# Patient Record
Sex: Male | Born: 1955
Health system: Southern US, Community
[De-identification: ages and names within clinical notes are randomized; demographics above are authoritative.]

## PROBLEM LIST (undated history)

## (undated) DIAGNOSIS — I7121 Aneurysm of the ascending aorta, without rupture: Secondary | ICD-10-CM

## (undated) DIAGNOSIS — I4891 Unspecified atrial fibrillation: Secondary | ICD-10-CM

## (undated) HISTORY — PX: CATARACT EXTRACTION, BILATERAL: SHX1313

## (undated) HISTORY — DX: Aneurysm of the ascending aorta, without rupture: I71.21

## (undated) HISTORY — DX: Unspecified atrial fibrillation: I48.91

## (undated) HISTORY — PX: PROSTATE BIOPSY: SHX241

---

## 1999-10-27 ENCOUNTER — Other Ambulatory Visit: Admission: RE | Admit: 1999-10-27 | Discharge: 1999-10-27 | Payer: Self-pay | Admitting: Gastroenterology

## 2003-03-08 ENCOUNTER — Emergency Department (HOSPITAL_COMMUNITY): Admission: EM | Admit: 2003-03-08 | Discharge: 2003-03-08 | Payer: Self-pay | Admitting: Emergency Medicine

## 2003-03-08 ENCOUNTER — Encounter: Payer: Self-pay | Admitting: Emergency Medicine

## 2005-05-12 ENCOUNTER — Ambulatory Visit: Payer: Self-pay | Admitting: Internal Medicine

## 2005-10-06 ENCOUNTER — Ambulatory Visit: Payer: Self-pay | Admitting: Internal Medicine

## 2005-10-14 ENCOUNTER — Ambulatory Visit: Payer: Self-pay | Admitting: Internal Medicine

## 2007-03-19 ENCOUNTER — Ambulatory Visit: Payer: Self-pay | Admitting: Internal Medicine

## 2007-03-19 LAB — CONVERTED CEMR LAB
ALT: 23 units/L (ref 0–40)
AST: 28 units/L (ref 0–37)
Alkaline Phosphatase: 53 units/L (ref 39–117)
BUN: 16 mg/dL (ref 6–23)
Basophils Relative: 0.9 % (ref 0.0–1.0)
CO2: 28 meq/L (ref 19–32)
Calcium: 9.2 mg/dL (ref 8.4–10.5)
Chloride: 109 meq/L (ref 96–112)
Creatinine, Ser: 1.1 mg/dL (ref 0.4–1.5)
GFR calc Af Amer: 91 mL/min
HDL: 47.8 mg/dL (ref >39.0)
LDL Cholesterol: 124 mg/dL — ABNORMAL HIGH (ref 0–99)
Monocytes Relative: 10.2 % (ref 3.0–11.0)
Neutro Abs: 1.7 10*3/uL (ref 1.4–7.7)
Platelets: 238 10*3/uL (ref 150–400)
RBC: 4.67 M/uL (ref 4.22–5.81)
RDW: 11.7 % (ref 11.5–14.6)
Total Protein: 6.4 g/dL (ref 6.0–8.3)
Triglycerides: 86 mg/dL (ref 0–149)
VLDL: 17 mg/dL (ref 0–40)
WBC: 4 10*3/uL — ABNORMAL LOW (ref 4.5–10.5)

## 2007-03-26 ENCOUNTER — Ambulatory Visit: Payer: Self-pay | Admitting: Internal Medicine

## 2007-04-04 ENCOUNTER — Ambulatory Visit: Payer: Self-pay | Admitting: Gastroenterology

## 2007-09-19 ENCOUNTER — Telehealth: Payer: Self-pay | Admitting: *Deleted

## 2007-09-25 DIAGNOSIS — J309 Allergic rhinitis, unspecified: Secondary | ICD-10-CM | POA: Insufficient documentation

## 2007-09-26 ENCOUNTER — Ambulatory Visit: Payer: Self-pay | Admitting: Internal Medicine

## 2007-10-02 ENCOUNTER — Telehealth (INDEPENDENT_AMBULATORY_CARE_PROVIDER_SITE_OTHER): Payer: Self-pay | Admitting: *Deleted

## 2007-10-05 ENCOUNTER — Encounter: Payer: Self-pay | Admitting: Internal Medicine

## 2007-10-31 ENCOUNTER — Telehealth: Payer: Self-pay | Admitting: *Deleted

## 2008-08-21 ENCOUNTER — Ambulatory Visit: Payer: Self-pay | Admitting: Internal Medicine

## 2008-08-21 LAB — CONVERTED CEMR LAB
ALT: 27 units/L (ref 0–53)
AST: 26 units/L (ref 0–37)
Basophils Absolute: 0 10*3/uL (ref 0.0–0.1)
Bilirubin, Direct: 0.1 mg/dL (ref 0.0–0.3)
Blood in Urine, dipstick: NEGATIVE
CO2: 29 meq/L (ref 19–32)
Chloride: 110 meq/L (ref 96–112)
Cholesterol: 203 mg/dL (ref 0–200)
Glucose, Urine, Semiquant: NEGATIVE
HDL: 55.5 mg/dL (ref >39.0)
Lymphocytes Relative: 32 % (ref 12.0–46.0)
MCHC: 34.9 g/dL (ref 30.0–36.0)
Neutrophils Relative %: 55.3 % (ref 43.0–77.0)
Protein, U semiquant: NEGATIVE
RBC: 4.67 M/uL (ref 4.22–5.81)
RDW: 11.8 % (ref 11.5–14.6)
Sodium: 144 meq/L (ref 135–145)
Total Bilirubin: 1 mg/dL (ref 0.3–1.2)
Urobilinogen, UA: 0.2
VLDL: 15 mg/dL (ref 0–40)
WBC Urine, dipstick: NEGATIVE
pH: 5.5

## 2008-09-01 ENCOUNTER — Ambulatory Visit: Payer: Self-pay | Admitting: Internal Medicine

## 2008-09-01 DIAGNOSIS — E785 Hyperlipidemia, unspecified: Secondary | ICD-10-CM | POA: Insufficient documentation

## 2008-09-01 DIAGNOSIS — Z86010 Personal history of colonic polyps: Secondary | ICD-10-CM | POA: Insufficient documentation

## 2008-09-01 HISTORY — DX: Hyperlipidemia, unspecified: E78.5

## 2008-09-03 ENCOUNTER — Telehealth: Payer: Self-pay | Admitting: *Deleted

## 2009-02-04 ENCOUNTER — Ambulatory Visit: Payer: Self-pay | Admitting: Internal Medicine

## 2009-02-04 DIAGNOSIS — T887XXA Unspecified adverse effect of drug or medicament, initial encounter: Secondary | ICD-10-CM | POA: Insufficient documentation

## 2009-02-04 LAB — CONVERTED CEMR LAB
AST: 27 units/L (ref 0–37)
Albumin: 4.1 g/dL (ref 3.5–5.2)
HDL: 59.6 mg/dL (ref >39.0)
LDL Cholesterol: 121 mg/dL — ABNORMAL HIGH (ref 0–99)
Total CHOL/HDL Ratio: 3.3
Triglycerides: 70 mg/dL (ref 0–149)

## 2009-02-26 ENCOUNTER — Ambulatory Visit: Payer: Self-pay | Admitting: Internal Medicine

## 2009-02-26 DIAGNOSIS — R030 Elevated blood-pressure reading, without diagnosis of hypertension: Secondary | ICD-10-CM | POA: Insufficient documentation

## 2009-02-26 DIAGNOSIS — G4700 Insomnia, unspecified: Secondary | ICD-10-CM | POA: Insufficient documentation

## 2009-03-30 ENCOUNTER — Encounter: Payer: Self-pay | Admitting: Internal Medicine

## 2009-03-31 ENCOUNTER — Encounter: Payer: Self-pay | Admitting: Internal Medicine

## 2010-07-27 ENCOUNTER — Telehealth: Payer: Self-pay | Admitting: Internal Medicine

## 2010-09-27 ENCOUNTER — Encounter: Payer: Self-pay | Admitting: Internal Medicine

## 2010-10-11 ENCOUNTER — Telehealth: Payer: Self-pay | Admitting: Internal Medicine

## 2010-10-15 ENCOUNTER — Ambulatory Visit: Payer: Self-pay | Admitting: Internal Medicine

## 2010-10-15 LAB — CONVERTED CEMR LAB
Albumin: 4 g/dL (ref 3.5–5.2)
BUN: 19 mg/dL (ref 6–23)
Basophils Absolute: 0 10*3/uL (ref 0.0–0.1)
Blood in Urine, dipstick: NEGATIVE
CO2: 24 meq/L (ref 19–32)
Chloride: 107 meq/L (ref 96–112)
Eosinophils Absolute: 0.1 10*3/uL (ref 0.0–0.7)
GFR calc non Af Amer: 80.11 mL/min (ref >60)
Glucose, Bld: 99 mg/dL (ref 70–99)
Glucose, Urine, Semiquant: NEGATIVE
HCT: 41 % (ref 39.0–52.0)
HDL: 53.4 mg/dL (ref >39.00)
Lymphs Abs: 1.7 10*3/uL (ref 0.7–4.0)
MCHC: 35.3 g/dL (ref 30.0–36.0)
MCV: 94.3 fL (ref 78.0–100.0)
Monocytes Absolute: 0.5 10*3/uL (ref 0.1–1.0)
Neutro Abs: 2 10*3/uL (ref 1.4–7.7)
PSA: 1.11 ng/mL (ref 0.10–4.00)
Platelets: 195 10*3/uL (ref 150.0–400.0)
Potassium: 4.1 meq/L (ref 3.5–5.1)
RDW: 13 % (ref 11.5–14.6)
Specific Gravity, Urine: 1.025
TSH: 1.4 microintl units/mL (ref 0.35–5.50)
Total Bilirubin: 0.9 mg/dL (ref 0.3–1.2)
WBC Urine, dipstick: NEGATIVE
pH: 5

## 2010-10-18 ENCOUNTER — Ambulatory Visit: Payer: Self-pay | Admitting: Internal Medicine

## 2010-11-03 ENCOUNTER — Ambulatory Visit: Payer: Self-pay | Admitting: Internal Medicine

## 2010-12-28 NOTE — Progress Notes (Signed)
Summary: Pt req to get an order to have chest xray done  Phone Note Call from Patient Call back at Work Phone [phone]   Caller: spouse-Janet Summary of Call: Pt is req to get a chest xray done. Pt just had a friend pass away with lung cancer and pt is just trying to be cautious. Pls advise.  Initial call taken by: Lucy Antigua,  October 11, 2010 3:36 PM  Follow-up for Phone Call        may order chest xray Follow-up by: Stacie Glaze MD,  October 11, 2010 7:34 PM

## 2010-12-28 NOTE — Progress Notes (Signed)
Summary: chest xray prior to cpx  Phone Note Call from Patient Call back at Home Phone [phone] Call back at [phone]   Caller: Patient Call For: Stacie Glaze MD Summary of Call: pt would like a chest xray prior to cpx on 10-12-2010.  Initial call taken by: Heron Sabins,  July 27, 2010 8:51 AM  Follow-up for Phone Call        ok- wife informed to have pt give [place] acall about 1-2 weeks prior and we will put in order and he can stop by at his convenience and have cxr Follow-up by: Willy Eddy, LPN,  July 27, 2010 9:00 AM

## 2010-12-28 NOTE — Consult Note (Signed)
Summary: [facility]  [facility]   Imported By: Maryln Gottron 10/01/2010 12:14:11  _____________________________________________________________________  External Attachment:    Type:   Image     Comment:   External Document

## 2010-12-30 NOTE — Assessment & Plan Note (Signed)
Summary: cpx/bmw   Vital Signs:  Patient profile:   55 year old male Height:      73 inches Weight:      209 pounds BMI:     27.67 Temp:     98.2 degrees F oral Pulse rate:   76 / minute Resp:     14 per minute BP sitting:   130 / 82  (left arm)  Vitals Entered By: Willy Eddy, LPN (November 03, 2010 3:29 PM) CC: cpx Is Patient Diabetic? No   Primary Care Mujahid Jalomo:  Stacie Glaze MD  CC:  cpx.  History of Present Illness: The pt was asked about all immunizations, health maint. services that are appropriate to their age and was given guidance on diet exercize  and weight management   Preventive Screening-Counseling & Management  Alcohol-Tobacco     Smoking Status: quit     Tobacco Counseling: to remain off tobacco products  Problems Prior to Update: 1)  Elevated Bp Reading Without Dx Hypertension  (ICD-796.2) 2)  Insomnia, Chronic, Mild  (ICD-307.42) 3)  Uns Advrs Eff Uns Rx Medicinal&biological Sbstnc  (ICD-995.20) 4)  Colonic Polyps, Hx of  (ICD-V12.72) 5)  Hyperlipidemia  (ICD-272.4) 6)  Physical Examination  (ICD-V70.0) 7)  Allergic Rhinitis  (ICD-477.9) 8)  Family History of Colon Ca 1st Degree Relative <60  (ICD-V16.0)  Current Problems (verified): 1)  Elevated Bp Reading Without Dx Hypertension  (ICD-796.2) 2)  Insomnia, Chronic, Mild  (ICD-307.42) 3)  Uns Advrs Eff Uns Rx Medicinal&biological Sbstnc  (ICD-995.20) 4)  Colonic Polyps, Hx of  (ICD-V12.72) 5)  Hyperlipidemia  (ICD-272.4) 6)  Physical Examination  (ICD-V70.0) 7)  Allergic Rhinitis  (ICD-477.9) 8)  Family History of Colon Ca 1st Degree Relative <60  (ICD-V16.0)  Medications Prior to Update: 1)  Bayer Aspirin 325 Mg  Tabs (Aspirin) 2)  Ambien Cr 12.5 Mg  Tbcr (Zolpidem Tartrate) .... One By Mouth Q Hs 3)  Fish Oil 1000 Mg Caps (Omega-3 Fatty Acids) .Marland Kitchen.. 1 Once Daily  Current Medications (verified): 1)  Bayer Aspirin 325 Mg  Tabs (Aspirin) 2)  Ambien Cr 12.5 Mg  Tbcr (Zolpidem  Tartrate) .... One By Mouth Q Hs 3)  Fish Oil 1000 Mg Caps (Omega-3 Fatty Acids) .Marland Kitchen.. 1 Once Daily  Allergies (verified): No Known Drug Allergies  Past History:  Family History: Last updated: 09/01/2008 Family History of Colon CA 1st degree relative <60 Family History of testicular cancer  brother  Social History: Last updated: 09/25/2007 Occupation: Married Former Smoker Alcohol use-yes  Risk Factors: Smoking Status: quit (11/03/2010)  Past medical, surgical, family and social histories (including risk factors) reviewed, and no changes noted (except as noted below).  Past Medical History: Reviewed history from 09/01/2008 and no changes required. Allergies Allergic rhinitis Hyperlipidemia borderline Colonic polyps, hx of  Past Surgical History: Reviewed history from 09/01/2008 and no changes required. Denies surgical history Colon polypectomy  Family History: Reviewed history from 09/01/2008 and no changes required. Family History of Colon CA 1st degree relative <60 Family History of testicular cancer  brother  Social History: Reviewed history from 09/25/2007 and no changes required. Occupation: Married Former Smoker Alcohol use-yes  Review of Systems  The patient denies anorexia, fever, weight loss, weight gain, vision loss, decreased hearing, hoarseness, chest pain, syncope, dyspnea on exertion, peripheral edema, prolonged cough, headaches, hemoptysis, abdominal pain, melena, hematochezia, severe indigestion/heartburn, hematuria, incontinence, genital sores, muscle weakness, suspicious skin lesions, transient blindness, difficulty walking, depression, unusual weight change, abnormal  bleeding, enlarged lymph nodes, angioedema, breast masses, and testicular masses.         Flu Vaccine Consent Questions     Do you have a history of severe allergic reactions to this vaccine? no    Any prior history of allergic reactions to egg and/or gelatin? no    Do you have a  sensitivity to the preservative Thimersol? no    Do you have a past history of Guillan-Barre Syndrome? no    Do you currently have an acute febrile illness? no    Have you ever had a severe reaction to latex? no    Vaccine information given and explained to patient? yes    Are you currently pregnant? no    Lot Number:[id number]   Exp Date:05/28/2011   Site Given  Left Deltoid IM   Physical Exam  General:  Well-developed,well-nourished,in no acute distress; alert,appropriate and cooperative throughout examination Head:  normocephalic and atraumatic.   Eyes:  pupils equal and pupils round.   Ears:  R ear normal and L ear normal.   Nose:  no external deformity and no nasal discharge.   Mouth:  good dentition and pharynx pink and moist.   Neck:  No deformities, masses, or tenderness noted. Lungs:  normal respiratory effort and no wheezes.   Heart:  normal rate and regular rhythm.   Abdomen:  Bowel sounds positive,abdomen soft and non-tender without masses, organomegaly or hernias noted. Rectal:  No external abnormalities noted. Normal sphincter tone. No rectal masses or tenderness. Genitalia:  Testes bilaterally descended without nodularity, tenderness or masses. No scrotal masses or lesions. No penis lesions or urethral discharge. Prostate:  no nodules and no asymmetry.   Msk:  no joint tenderness and no joint swelling.   Pulses:  R and L carotid,radial,femoral,dorsalis pedis and posterior tibial pulses are full and equal bilaterally Extremities:  No clubbing, cyanosis, edema, or deformity noted with normal full range of motion of all joints.   Neurologic:  No cranial nerve deficits noted. Station and gait are normal. Plantar reflexes are down-going bilaterally. DTRs are symmetrical throughout. Sensory, motor and coordinative functions appear intact.   Impression & Recommendations:  Problem # 1:  ELEVATED BP READING WITHOUT DX HYPERTENSION (ICD-796.2) Assessment Improved  BP today:  130/82 Prior BP: 142/90 (02/26/2009)  Labs Reviewed: Creat: 1.0 (10/15/2010) Chol: 202 (10/15/2010)   HDL: 53.40 (10/15/2010)   LDL: 121 (02/04/2009)   TG: 92.0 (10/15/2010)  Instructed in low sodium diet (DASH Handout) and behavior modification.    Problem # 2:  HYPERLIPIDEMIA (ICD-272.4) Assessment: Improved  Labs Reviewed: SGOT: 25 (10/15/2010)   SGPT: 28 (10/15/2010)   HDL:53.40 (10/15/2010), 59.6 (02/04/2009)  LDL:121 (02/04/2009), DEL (08/21/2008)  Chol:202 (10/15/2010), 195 (02/04/2009)  Trig:92.0 (10/15/2010), 70 (02/04/2009)  Problem # 3:  PHYSICAL EXAMINATION (ICD-V70.0) The pt was asked about all immunizations, health maint. services that are appropriate to their age and was given guidance on diet exercize  and weight management  Colonoscopy: Results: Polyp.  Pathology:  Adenomatous polyp.         (08/20/2007) Td Booster: Historical (11/28/2006)   Flu Vax: Fluvax 3+ (11/03/2010)   Chol: 202 (10/15/2010)   HDL: 53.40 (10/15/2010)   LDL: 121 (02/04/2009)   TG: 92.0 (10/15/2010) TSH: 1.40 (10/15/2010)   PSA: 1.11 (10/15/2010)  Discussed using sunscreen, use of alcohol, drug use, self testicular exam, routine dental care, routine eye care, routine physical exam, seat belts, multiple vitamins, osteoporosis prevention, adequate calcium intake in diet, and recommendations for immunizations.  Discussed exercise and checking cholesterol.  Discussed gun safety, safe sex, and contraception. Also recommend checking PSA.  Complete Medication List: 1)  Bayer Aspirin 325 Mg Tabs (Aspirin) 2)  Ambien Cr 12.5 Mg Tbcr (Zolpidem tartrate) .... One by mouth q hs 3)  Fish Oil 1000 Mg Caps (Omega-3 fatty acids) .Marland Kitchen.. 1 once daily  Other Orders: Admin 1st Vaccine (16109) Flu Vaccine 9yrs + (60454)  Patient Instructions: 1)  Please schedule a follow-up appointment in 4 months. 2)  slight elevations of bad cholesterol that increase risk 3)  omega three's will help to correct this 4)   exercize and weight reduction goals 5)  Add fish oil or kril oil 6)  1000 mg two times a day 7)  Hepatic Panel prior to visit, ICD-9:995.20 8)  Lipid Panel prior to visit, ICD-9:272.4 Prescriptions: AMBIEN CR 12.5 MG  TBCR (ZOLPIDEM TARTRATE) one by mouth q HS  #30 x 5   Entered by:   Willy Eddy, LPN   Authorized by:   Stacie Glaze MD   Signed by:   Willy Eddy, LPN on 09/81/1914   Method used:   Print then Give to Patient   RxID:   [id number]    Orders Added: 1)  Admin 1st Vaccine [90471] 2)  Flu Vaccine 35yrs + [69629] 3)  Est. Patient 40-64 years [52841]

## 2011-02-23 ENCOUNTER — Other Ambulatory Visit: Payer: Self-pay

## 2011-03-02 ENCOUNTER — Ambulatory Visit: Payer: Self-pay | Admitting: Internal Medicine

## 2011-03-02 DIAGNOSIS — Z0289 Encounter for other administrative examinations: Secondary | ICD-10-CM

## 2011-06-20 ENCOUNTER — Other Ambulatory Visit: Payer: Self-pay | Admitting: *Deleted

## 2011-06-20 MED ORDER — ZOLPIDEM TARTRATE ER 12.5 MG PO TBCR: 12.5000 mg | EXTENDED_RELEASE_TABLET | Freq: Every evening | ORAL | Status: DC | PRN

## 2012-07-02 ENCOUNTER — Other Ambulatory Visit: Payer: Self-pay

## 2012-07-09 ENCOUNTER — Encounter: Payer: Self-pay | Admitting: Internal Medicine

## 2012-12-14 ENCOUNTER — Ambulatory Visit (INDEPENDENT_AMBULATORY_CARE_PROVIDER_SITE_OTHER): Payer: BC Managed Care – PPO | Admitting: Internal Medicine

## 2012-12-14 ENCOUNTER — Encounter: Payer: Self-pay | Admitting: Internal Medicine

## 2012-12-14 VITALS — BP 150/90 | HR 84 | Temp 97.7°F | Resp 18 | Wt 206.0 lb

## 2012-12-14 DIAGNOSIS — F411 Generalized anxiety disorder: Secondary | ICD-10-CM

## 2012-12-14 DIAGNOSIS — F418 Other specified anxiety disorders: Secondary | ICD-10-CM

## 2012-12-14 MED ORDER — LORAZEPAM 0.5 MG PO TABS: 0.5000 mg | ORAL_TABLET | Freq: Two times a day (BID) | ORAL | Status: DC | PRN

## 2012-12-14 MED ORDER — ESCITALOPRAM OXALATE 10 MG PO TABS: 10.0000 mg | ORAL_TABLET | Freq: Every day | ORAL | Status: DC

## 2012-12-14 MED ORDER — ZOLPIDEM TARTRATE 10 MG PO TABS: 10.0000 mg | ORAL_TABLET | Freq: Every evening | ORAL | Status: DC | PRN

## 2012-12-14 NOTE — Progress Notes (Signed)
  Subjective:    Patient ID: Sean [name], male    DOB: Sep 24, 1956, 57 y.o.   MRN: [id number]  HPI  57 year old patient who presents with a chief complaint of anxiety. He has been under considerable situational stress related  To 2  teenage children which apparently has peaked over the past few days. This has resulted in worsening insomnia and excessive daytime anxiety and worry.  He is a Environmental education officer and deals with the work-related stress on a regular basis but presently feels overwhelmed due  to the worsening stressors  No past medical history on file.  History   Social History  . Marital Status: Married    Spouse Name: N/A    Number of Children: N/A  . Years of Education: N/A   Occupational History  . Not on file.   Social History Main Topics  . Smoking status: Current Some Day Smoker    Types: Cigars  . Smokeless tobacco: Never Used  . Alcohol Use: 2.4 - 3.0 oz/week    4-5 Cans of beer per week  . Drug Use: No  . Sexually Active: Not on file   Other Topics Concern  . Not on file   Social History Narrative  . No narrative on file    No past surgical history on file.  No family history on file.  No Known Allergies  Current Outpatient Prescriptions on File Prior to Visit  Medication Sig Dispense Refill  . escitalopram (LEXAPRO) 10 MG tablet Take 1 tablet (10 mg total) by mouth daily.  60 tablet  2  . zolpidem (AMBIEN) 10 MG tablet Take 1 tablet (10 mg total) by mouth at bedtime as needed for sleep.  30 tablet  3    BP 150/90  Pulse 84  Temp 97.7 F (36.5 C) (Oral)  Resp 18  Wt 206 lb (93.441 kg)      Review of Systems  Psychiatric/Behavioral: Positive for sleep disturbance. The patient is nervous/anxious.        Objective:   Physical Exam  Constitutional: He appears well-developed and well-nourished. No distress.       140/86  Psychiatric: He has a normal mood and affect. His behavior is normal. Judgment and thought content normal.           Assessment & Plan:   Situational stress with insomnia. We'll refill Ambien which has been used and helpful in the past. Options were discussed;  we'll treat with the Lexapro as well as when necessary alprazolam.  He will consider behavioral health referral. Stress management discussed The patient will return in 4-6 weeks for followup but will call if he is not  pleased with his progress or desires referral for counseling

## 2012-12-14 NOTE — Patient Instructions (Addendum)
Call or return to clinic prn if these symptoms worsen or fail to improve as anticipated.

## 2013-03-12 ENCOUNTER — Other Ambulatory Visit: Payer: BC Managed Care – PPO

## 2013-03-14 ENCOUNTER — Other Ambulatory Visit (INDEPENDENT_AMBULATORY_CARE_PROVIDER_SITE_OTHER): Payer: BC Managed Care – PPO

## 2013-03-14 DIAGNOSIS — Z0000 Encounter for general adult medical examination without abnormal findings: Secondary | ICD-10-CM

## 2013-03-14 LAB — CBC WITH DIFFERENTIAL/PLATELET
Basophils Absolute: 0 10*3/uL (ref 0.0–0.1)
Hemoglobin: 15.1 g/dL (ref 13.0–17.0)
Lymphocytes Relative: 35.2 % (ref 12.0–46.0)
Monocytes Relative: 11.6 % (ref 3.0–12.0)
Neutro Abs: 2.5 10*3/uL (ref 1.4–7.7)
RDW: 12.4 % (ref 11.5–14.6)

## 2013-03-14 LAB — HEPATIC FUNCTION PANEL
AST: 27 U/L (ref 0–37)
Albumin: 4.1 g/dL (ref 3.5–5.2)
Alkaline Phosphatase: 44 U/L (ref 39–117)
Bilirubin, Direct: 0.1 mg/dL (ref 0.0–0.3)
Total Bilirubin: 0.7 mg/dL (ref 0.3–1.2)

## 2013-03-14 LAB — BASIC METABOLIC PANEL
Calcium: 9 mg/dL (ref 8.4–10.5)
GFR: 79.39 mL/min (ref >60.00)
Glucose, Bld: 93 mg/dL (ref 70–99)
Sodium: 138 mEq/L (ref 135–145)

## 2013-03-14 LAB — LIPID PANEL
Cholesterol: 173 mg/dL (ref 0–200)
LDL Cholesterol: 99 mg/dL (ref 0–99)
Triglycerides: 139 mg/dL (ref 0.0–149.0)
VLDL: 27.8 mg/dL (ref 0.0–40.0)

## 2013-03-14 LAB — POCT URINALYSIS DIPSTICK
Bilirubin, UA: NEGATIVE
Glucose, UA: NEGATIVE
Ketones, UA: NEGATIVE
Spec Grav, UA: 1.02

## 2013-03-18 ENCOUNTER — Encounter: Payer: Self-pay | Admitting: Internal Medicine

## 2013-03-18 ENCOUNTER — Ambulatory Visit (INDEPENDENT_AMBULATORY_CARE_PROVIDER_SITE_OTHER): Payer: BC Managed Care – PPO | Admitting: Internal Medicine

## 2013-03-18 VITALS — BP 136/80 | HR 72 | Temp 98.2°F | Resp 16 | Ht 73.0 in | Wt 208.0 lb

## 2013-03-18 DIAGNOSIS — Z0000 Encounter for general adult medical examination without abnormal findings: Secondary | ICD-10-CM

## 2013-03-18 NOTE — Patient Instructions (Addendum)
The patient is instructed to continue all medications as prescribed. Schedule followup with check out clerk upon leaving the clinic  

## 2013-03-18 NOTE — Progress Notes (Signed)
Subjective:    Patient ID: Sean [name], male    DOB: 1956/06/23, 57 y.o.   MRN: [id number]  HPI CPX discussion of weight   Review of Systems  Constitutional: Negative for fever and fatigue.  HENT: Negative for hearing loss, congestion, neck pain and postnasal drip.   Eyes: Negative for discharge, redness and visual disturbance.  Respiratory: Negative for cough, shortness of breath and wheezing.   Cardiovascular: Negative for leg swelling.  Gastrointestinal: Negative for abdominal pain, constipation and abdominal distention.  Genitourinary: Negative for urgency and frequency.  Musculoskeletal: Negative for joint swelling and arthralgias.  Skin: Negative for color change and rash.  Neurological: Negative for weakness and light-headedness.  Hematological: Negative for adenopathy.  Psychiatric/Behavioral: Negative for behavioral problems.   History reviewed. No pertinent past medical history.  History   Social History  . Marital Status: Married    Spouse Name: N/A    Number of Children: N/A  . Years of Education: N/A   Occupational History  . Not on file.   Social History Main Topics  . Smoking status: Current Some Day Smoker    Types: Cigars  . Smokeless tobacco: Never Used  . Alcohol Use: 2.4 - 3 oz/week    4-5 Cans of beer per week  . Drug Use: No  . Sexually Active: Not on file   Other Topics Concern  . Not on file   Social History Narrative  . No narrative on file    History reviewed. No pertinent past surgical history.  No family history on file.  No Known Allergies  Current Outpatient Prescriptions on File Prior to Visit  Medication Sig Dispense Refill  . aspirin 81 MG tablet Take 81 mg by mouth daily.      . MULTIPLE VITAMIN PO Take 1 tablet by mouth daily.      . OMEGA-3 KRILL OIL PO Take 1 capsule by mouth daily.      . Pseudoephedrine HCl (SUDAFED 12 HOUR PO) Take 1 tablet by mouth 2 (two) times daily as needed.      . zolpidem (AMBIEN) 10  MG tablet Take 1 tablet (10 mg total) by mouth at bedtime as needed for sleep.  30 tablet  3   No current facility-administered medications on file prior to visit.    BP 136/80  Pulse 72  Temp(Src) 98.2 F (36.8 C)  Resp 16  Ht 6\' 1"  (1.854 m)  Wt 208 lb (94.348 kg)  BMI 27.45 kg/m2        Objective:   Physical Exam  Nursing note and vitals reviewed. Constitutional: He is oriented to person, place, and time. He appears well-developed and well-nourished.  HENT:  Head: Normocephalic and atraumatic.  Eyes: Conjunctivae are normal. Pupils are equal, round, and reactive to light.  Neck: Normal range of motion. Neck supple.  Cardiovascular: Normal rate and regular rhythm.   Pulmonary/Chest: Effort normal and breath sounds normal.  Abdominal: Soft. Bowel sounds are normal.  Genitourinary: Rectum normal and prostate normal.  Musculoskeletal: Normal range of motion.  Neurological: He is alert and oriented to person, place, and time.  Skin: Skin is warm.  Psychiatric: He has a normal mood and affect. His behavior is normal.          Assessment & Plan:  Situational depression resolved Sleep stable  Patient presents for yearly preventative medicine examination.   all immunizations and health maintenance protocols were reviewed with the patient and they are up to date with these  protocols.   screening laboratory values were reviewed with the patient including screening of hyperlipidemia PSA renal function and hepatic function.   There medications past medical history social history problem list and allergies were reviewed in detail.   Goals were established with regard to weight loss exercise diet in compliance with medications

## 2013-04-12 ENCOUNTER — Other Ambulatory Visit: Payer: Self-pay | Admitting: Surgery

## 2013-06-06 ENCOUNTER — Telehealth: Payer: Self-pay | Admitting: Internal Medicine

## 2013-06-06 NOTE — Telephone Encounter (Signed)
Pt requesting referral to ENT for chronic raw throat. Please advise.

## 2013-06-07 ENCOUNTER — Ambulatory Visit (INDEPENDENT_AMBULATORY_CARE_PROVIDER_SITE_OTHER): Payer: BC Managed Care – PPO | Admitting: Internal Medicine

## 2013-06-07 ENCOUNTER — Encounter: Payer: Self-pay | Admitting: Internal Medicine

## 2013-06-07 ENCOUNTER — Ambulatory Visit: Payer: BC Managed Care – PPO | Admitting: Family Medicine

## 2013-06-07 VITALS — BP 148/90 | HR 68 | Temp 99.0°F | Wt 208.0 lb

## 2013-06-07 DIAGNOSIS — J019 Acute sinusitis, unspecified: Secondary | ICD-10-CM

## 2013-06-07 MED ORDER — LEVOFLOXACIN 500 MG PO TABS: 500.0000 mg | ORAL_TABLET | Freq: Every day | ORAL | Status: DC

## 2013-06-07 NOTE — Progress Notes (Signed)
  Subjective:    Patient ID: Sean [name], male    DOB: 1956/04/10, 57 y.o.   MRN: [id number]  HPI  Several weeks of sore throat, Past nasal drip and irritation Right sided cervical tenderness Recent skin cancer surgery Moderately elevated blood pressure ( anxiety)    Review of Systems  Constitutional: Negative for fever and fatigue.  HENT: Negative for hearing loss, congestion, neck pain and postnasal drip.   Eyes: Negative for discharge, redness and visual disturbance.  Respiratory: Negative for cough, shortness of breath and wheezing.   Cardiovascular: Negative for leg swelling.  Gastrointestinal: Negative for abdominal pain, constipation and abdominal distention.  Genitourinary: Negative for urgency and frequency.  Musculoskeletal: Negative for joint swelling and arthralgias.  Skin: Negative for color change and rash.  Neurological: Negative for weakness and light-headedness.  Hematological: Negative for adenopathy.  Psychiatric/Behavioral: Negative for behavioral problems.   No past medical history on file.  History   Social History  . Marital Status: Married    Spouse Name: N/A    Number of Children: N/A  . Years of Education: N/A   Occupational History  . Not on file.   Social History Main Topics  . Smoking status: Current Some Day Smoker    Types: Cigars  . Smokeless tobacco: Never Used  . Alcohol Use: 2.4 - 3 oz/week    4-5 Cans of beer per week  . Drug Use: No  . Sexually Active: Not on file   Other Topics Concern  . Not on file   Social History Narrative  . No narrative on file    No past surgical history on file.  No family history on file.  No Known Allergies  Current Outpatient Prescriptions on File Prior to Visit  Medication Sig Dispense Refill  . aspirin 81 MG tablet Take 81 mg by mouth daily.      . MULTIPLE VITAMIN PO Take 1 tablet by mouth daily.      . OMEGA-3 KRILL OIL PO Take 1 capsule by mouth daily.      . Pseudoephedrine HCl  (SUDAFED 12 HOUR PO) Take 1 tablet by mouth 2 (two) times daily as needed.      . zolpidem (AMBIEN) 10 MG tablet Take 1 tablet (10 mg total) by mouth at bedtime as needed for sleep.  30 tablet  3   No current facility-administered medications on file prior to visit.    BP 148/90  Pulse 68  Temp(Src) 99 F (37.2 C) (Oral)  Wt 208 lb (94.348 kg)  BMI 27.45 kg/m2       Objective:   Physical Exam  Nursing note and vitals reviewed. Constitutional: He appears well-developed and well-nourished.  HENT:  Head: Normocephalic and atraumatic.  Eyes: Conjunctivae are normal. Pupils are equal, round, and reactive to light.  Neck: Normal range of motion. Neck supple.  Cardiovascular: Normal rate and regular rhythm.   Pulmonary/Chest: Effort normal and breath sounds normal.  Abdominal: Soft. Bowel sounds are normal.          Assessment & Plan:  Acute sinus  Infection treat with Levaquin 500 mg by mouth daily for 10 days contact through my chart for resolution of symptoms

## 2013-06-07 NOTE — Patient Instructions (Addendum)
The patient is instructed to continue all medications as prescribed. Schedule followup with check out clerk upon leaving the clinic  

## 2013-06-07 NOTE — Telephone Encounter (Signed)
Ov with dr Lovell Sheehan this pm

## 2013-06-12 ENCOUNTER — Encounter: Payer: Self-pay | Admitting: Internal Medicine

## 2013-06-19 ENCOUNTER — Telehealth: Payer: Self-pay | Admitting: Internal Medicine

## 2013-06-19 ENCOUNTER — Ambulatory Visit: Payer: Self-pay | Admitting: Family Medicine

## 2013-06-19 NOTE — Telephone Encounter (Signed)
Talked with wife and told her that dr j is out of office,but will return tomorrow and will ask, but he usually orders ct of sinus an d if chronic sinusitis will take antibioitc for 21 days. Will send for dr Lovell Sheehan to advise

## 2013-06-19 NOTE — Telephone Encounter (Signed)
Talked with Sean [name] and he will wait until dr Lovell Sheehan responds in am

## 2013-06-19 NOTE — Telephone Encounter (Signed)
Patient Information:  Caller Name: [place]  Phone: [phone]  Patient: Sean [name], Sean [name]  Gender: Male  DOB: 04-11-1956  Age: 57 Years  PCP: Darryll Capers (Adults only)  Office Follow Up:  Does the office need to follow up with this patient?: No  Instructions For The Office: N/A  RN Note:  Mild sinus discomfort behind right eye.  Had basal cell cancer removed from nostril near eye 05/30/13. Felt much improved while taking Levoquin but still  minimally symptomatic after completed antibiotic. Advised to hydrate to keep mucus loose.   Symptoms  Reason For Call & Symptoms: Recurrent post nasal drip with white discharge on posterior pharynx.  Symtpoms returned after completed 10 day dose of Levoquin.  Reviewed Health History In EMR: Yes  Reviewed Medications In EMR: Yes  Reviewed Allergies In EMR: Yes  Reviewed Surgeries / Procedures: Yes  Date of Onset of Symptoms: 06/17/2013  Treatments Tried: Sudafed  Treatments Tried Worked: Yes  Guideline(s) Used:  Sinus Pain and Congestion  Disposition Per Guideline:   See Today or Tomorrow in Office  Reason For Disposition Reached:   Sinus congestion (pressure, fullness) present > 10 days  Advice Given:  N/A  Patient Will Follow Care Advice:  YES  Appointment Scheduled:  06/19/2013 15:00:00 Appointment Scheduled Provider:  Gershon Crane [facility])

## 2013-06-19 NOTE — Telephone Encounter (Signed)
Patient Information:  Caller Name: Marylu Lund  Phone: [phone]  Patient: Sean [name], Sean [name]  Gender: Male  DOB: 01-23-1956  Age: 57 Years  PCP: Darryll Capers (Adults only)  Office Follow Up:  Does the office need to follow up with this patient?: No  Instructions For The Office: N/A  RN Note:  Unable to contact patient for triage; left message for him to call back regarding symptoms.  Symptoms  Reason For Call & Symptoms: Wife called to report  recurrent symptoms/ sinus drainage.  Completed 10 days of Levaquin and unknown decongestant samples.  Asking if needs additional antibiotics and for Rx decongestant.  [place] is at work; not available by phone at time of call.  Left message at work [phone] to call the office regarding current symptoms.  Reviewed Health History In EMR: N/A  Reviewed Medications In EMR: N/A  Reviewed Allergies In EMR: N/A  Reviewed Surgeries / Procedures: N/A  Date of Onset of Symptoms: 06/19/2013  Guideline(s) Used:  No Protocol Available - Sick Adult  Disposition Per Guideline:   Home Care  Reason For Disposition Reached:   Patient's symptoms are safe to treat at home per nursing judgment  Advice Given:  N/A  RN Overrode Recommendation:  Follow Up With Office Later  Left message to call back regarding symptoms (for triage)

## 2013-06-20 ENCOUNTER — Other Ambulatory Visit: Payer: Self-pay | Admitting: *Deleted

## 2013-06-20 ENCOUNTER — Other Ambulatory Visit: Payer: Self-pay | Admitting: Internal Medicine

## 2013-06-20 DIAGNOSIS — J329 Chronic sinusitis, unspecified: Secondary | ICD-10-CM

## 2013-06-20 NOTE — Telephone Encounter (Signed)
Left message on machine--sinus ct ordered sent to [facility] and she will call him to schedule

## 2013-06-20 NOTE — Telephone Encounter (Signed)
May order CT of sinus

## 2013-06-26 ENCOUNTER — Other Ambulatory Visit: Payer: BC Managed Care – PPO

## 2013-06-28 ENCOUNTER — Telehealth: Payer: Self-pay | Admitting: Internal Medicine

## 2013-06-28 MED ORDER — ZOLPIDEM TARTRATE 10 MG PO TABS: 10.0000 mg | ORAL_TABLET | Freq: Every evening | ORAL | Status: DC | PRN

## 2013-06-28 NOTE — Telephone Encounter (Signed)
Sent to pharmacy 

## 2013-06-28 NOTE — Telephone Encounter (Signed)
Pt needs refill on generic ambien 10 mg call into [facility] [address]. Pt wife ia may take up to 3 business days. Pt is going out of town

## 2013-08-01 ENCOUNTER — Telehealth: Payer: Self-pay | Admitting: Internal Medicine

## 2013-08-01 NOTE — Telephone Encounter (Signed)
Pt requesting refill on his ZOLPIDEM 10mg . Pharmacy is [facility] [address]

## 2013-08-02 MED ORDER — ZOLPIDEM TARTRATE 10 MG PO TABS: 10.0000 mg | ORAL_TABLET | Freq: Every evening | ORAL | Status: DC | PRN

## 2013-08-02 NOTE — Telephone Encounter (Signed)
Ok per Dr Jenkins, rx called in 

## 2013-10-03 ENCOUNTER — Other Ambulatory Visit: Payer: Self-pay

## 2013-12-09 ENCOUNTER — Other Ambulatory Visit: Payer: Self-pay | Admitting: Internal Medicine

## 2014-04-24 ENCOUNTER — Telehealth: Payer: Self-pay | Admitting: Internal Medicine

## 2014-04-24 MED ORDER — ZOLPIDEM TARTRATE 10 MG PO TABS: ORAL_TABLET | ORAL | Status: DC

## 2014-04-24 NOTE — Telephone Encounter (Signed)
Ok per Dr Lovell Sheehan x1 needs ov, [facility]

## 2014-04-24 NOTE — Telephone Encounter (Signed)
Pt is needing new rx zolpidem (AMBIEN) 10 MG tablet, please send to [facility].

## 2014-04-24 NOTE — Telephone Encounter (Signed)
ok 

## 2014-04-24 NOTE — Telephone Encounter (Signed)
Pt is needing to get a cpe, and is requesting dr. Kirtland Bouchard, and also would like to know if dr. Kirtland Bouchard will accept him as his primary pcp once dr. Lovell Sheehan is gone.

## 2014-04-24 NOTE — Telephone Encounter (Signed)
appt made for pt

## 2014-06-03 ENCOUNTER — Encounter: Payer: Self-pay | Admitting: Physician Assistant

## 2014-06-03 ENCOUNTER — Ambulatory Visit (INDEPENDENT_AMBULATORY_CARE_PROVIDER_SITE_OTHER): Payer: BC Managed Care – PPO | Admitting: Physician Assistant

## 2014-06-03 ENCOUNTER — Encounter: Payer: Self-pay | Admitting: Nurse Practitioner

## 2014-06-03 VITALS — BP 130/82 | HR 66 | Temp 98.2°F | Resp 18 | Wt 204.0 lb

## 2014-06-03 DIAGNOSIS — K649 Unspecified hemorrhoids: Secondary | ICD-10-CM

## 2014-06-03 NOTE — Progress Notes (Signed)
Subjective:    Patient ID: Sean [name], male    DOB: Jul 17, 1956, 58 y.o.   MRN: [id number]  Rectal Bleeding  The current episode started 2 days ago. The onset was sudden. The problem occurs occasionally. The problem has been gradually worsening. The patient is experiencing no pain. The stool is described as soft. Treatments tried: tried sitz baths last night and this morning, thinks this may have made it worse by way of causing a scab to fall off, resulting in more bleeding. Associated symptoms include rectal pain (very minor pain, only with wiping.). Pertinent negatives include no anorexia, no fever, no abdominal pain, no diarrhea, no hematemesis, no nausea, no vomiting, no hematuria, no chest pain, no headaches, no coughing, no difficulty breathing and no rash. Hemorrhoids: unsure if he has these, has had colonoscopies in the past, never been told he has hemorrhoids. There were no sick contacts. He has received no recent medical care.  Family history of colon cancer in mother. Estimates he has had around 5 colonoscopies in the past 10 years. His last colonoscopy was about 18 months ago, had no abnormalities and was pushed out to 5 years in between colonoscopies.    Review of Systems  Constitutional: Negative for fever and chills.  Respiratory: Negative for cough and shortness of breath.   Cardiovascular: Negative for chest pain.  Gastrointestinal: Positive for hematochezia, anal bleeding and rectal pain (very minor pain, only with wiping.). Negative for nausea, vomiting, abdominal pain, diarrhea, constipation, blood in stool, anorexia and hematemesis. Hemorrhoids: unsure if he has these, has had colonoscopies in the past, never been told he has hemorrhoids.  Genitourinary: Negative for hematuria.  Skin: Negative for rash.  Neurological: Negative for headaches.  All other systems reviewed and are negative.     History reviewed. No pertinent past medical history.  History   Social  History  . Marital Status: Married    Spouse Name: N/A    Number of Children: N/A  . Years of Education: N/A   Occupational History  . Not on file.   Social History Main Topics  . Smoking status: Current Some Day Smoker    Types: Cigars  . Smokeless tobacco: Never Used  . Alcohol Use: 2.4 - 3.0 oz/week    4-5 Cans of beer per week  . Drug Use: No  . Sexual Activity: Not on file   Other Topics Concern  . Not on file   Social History Narrative  . No narrative on file    History reviewed. No pertinent past surgical history.  No family history on file.  No Known Allergies  Current Outpatient Prescriptions on File Prior to Visit  Medication Sig Dispense Refill  . aspirin 81 MG tablet Take 81 mg by mouth daily.      . MULTIPLE VITAMIN PO Take 1 tablet by mouth daily.      . OMEGA-3 KRILL OIL PO Take 1 capsule by mouth daily.      . Pseudoephedrine HCl (SUDAFED 12 HOUR PO) Take 1 tablet by mouth 2 (two) times daily as needed.      . zolpidem (AMBIEN) 10 MG tablet TAKE ONE TABLET BY MOUTH AT BEDTIME AS NEEDED NEEDS OV  30 tablet  0   No current facility-administered medications on file prior to visit.    EXAM: BP 130/82  Pulse 66  Temp(Src) 98.2 F (36.8 C) (Oral)  Resp 18  Wt 204 lb (92.534 kg)     Objective:  Physical Exam  Nursing note and vitals reviewed. Constitutional: He is oriented to person, place, and time. He appears well-developed and well-nourished. No distress.  HENT:  Head: Normocephalic and atraumatic.  Eyes: Conjunctivae and EOM are normal. Pupils are equal, round, and reactive to light.  Neck: Normal range of motion.  Cardiovascular: Normal rate, regular rhythm and intact distal pulses.   Pulmonary/Chest: Effort normal and breath sounds normal. No respiratory distress. He exhibits no tenderness.  Genitourinary: Guaiac positive stool.  Visible gross blood around anus. Mostly dried. Skin tag in the 12 o'clock position. Large hemorrhoid with  visible scab in the 4 o'clock position.  Musculoskeletal: Normal range of motion.  Neurological: He is alert and oriented to person, place, and time.  Skin: Skin is warm and dry. No rash noted. He is not diaphoretic. No erythema. No pallor.  Psychiatric: He has a normal mood and affect. His behavior is normal. Judgment and thought content normal.    Lab Results  Component Value Date   WBC 5.0 03/14/2013   HGB 15.1 03/14/2013   HCT 44.2 03/14/2013   PLT 196.0 03/14/2013   GLUCOSE 93 03/14/2013   CHOL 173 03/14/2013   TRIG 139.0 03/14/2013   HDL 46.50 03/14/2013   LDLDIRECT 137.4 10/15/2010   LDLCALC 99 03/14/2013   ALT 27 03/14/2013   AST 27 03/14/2013   NA 138 03/14/2013   K 4.2 03/14/2013   CL 105 03/14/2013   CREATININE 1.0 03/14/2013   BUN 18 03/14/2013   CO2 26 03/14/2013   TSH 1.47 03/14/2013   PSA 1.58 03/14/2013         Assessment & Plan:  Sean [name] was seen today for rectal bleeding.  Diagnoses and associated orders for this visit:  Hemorrhoids, unspecified hemorrhoid type Comments: Likely the cause of bleeding. Gross blood around rectum. Will send back to GI for appointment to evaluate.    Will have pt follow up closely with GI for treatment. Can try home therapies provided in handout prior to visit with GI.  Return precautions provided, and patient handout on hemorrhoids.   Plan to follow up as needed, or for worsening or persistent symptoms despite treatment.  Patient Instructions  You will need to have an appointment with GI so they can evaluate your hemorrhoids and determine what they should do to treat them.  If emergency symptoms discussed during visit developed, seek medical attention immediately.  Followup as needed, or for worsening or persistent symptoms despite treatment.

## 2014-06-03 NOTE — Patient Instructions (Signed)
You will need to have an appointment with GI so they can evaluate your hemorrhoids and determine what they should do to treat them.  If emergency symptoms discussed during visit developed, seek medical attention immediately.  Followup as needed, or for worsening or persistent symptoms despite treatment.   Hemorrhoids Hemorrhoids are puffy (swollen) veins around the rectum or anus. Hemorrhoids can cause pain, itching, bleeding, or irritation. HOME CARE  Eat foods with fiber, such as whole grains, beans, nuts, fruits, and vegetables. Ask your doctor about taking products with added fiber in them (fibersupplements).  Drink enough fluid to keep your pee (urine) clear or pale yellow.  Exercise often.  Go to the bathroom when you have the urge to poop. Do not wait.  Avoid straining to poop (bowel movement).  Keep the butt area dry and clean. Use wet toilet paper or moist paper towels.  Medicated creams and medicine inserted into the anus (anal suppository) may be used or applied as told.  Only take medicine as told by your doctor.  Take a warm water bath (sitz bath) for 15-20 minutes to ease pain. Do this 3-4 times a day.  Place ice packs on the area if it is tender or puffy. Use the ice packs between the warm water baths.  Put ice in a plastic bag.  Place a towel between your skin and the bag.  Leave the ice on for 15-20 minutes, 03-04 times a day.  Do not use a donut-shaped pillow or sit on the toilet for a long time. GET HELP RIGHT AWAY IF:   You have more pain that is not controlled by treatment or medicine.  You have bleeding that will not stop.  You have trouble or are unable to poop (bowel movement).  You have pain or puffiness outside the area of the hemorrhoids. MAKE SURE YOU:   Understand these instructions.  Will watch your condition.  Will get help right away if you are not doing well or get worse. Document Released: 08/23/2008 Document Revised:  10/31/2012 Document Reviewed: 09/25/2012 [facility] Patient Information 2015 [place], [place]. This information is not intended to replace advice given to you by your health care provider. Make sure you discuss any questions you have with your health care provider.

## 2014-06-03 NOTE — Progress Notes (Signed)
Pre visit review using our clinic review tool, if applicable. No additional management support is needed unless otherwise documented below in the visit note. 

## 2014-06-13 ENCOUNTER — Ambulatory Visit: Payer: BC Managed Care – PPO | Admitting: Nurse Practitioner

## 2014-06-17 ENCOUNTER — Ambulatory Visit: Payer: BC Managed Care – PPO | Admitting: Nurse Practitioner

## 2014-06-21 ENCOUNTER — Other Ambulatory Visit: Payer: Self-pay | Admitting: Internal Medicine

## 2014-06-24 ENCOUNTER — Other Ambulatory Visit (INDEPENDENT_AMBULATORY_CARE_PROVIDER_SITE_OTHER): Payer: BC Managed Care – PPO

## 2014-06-24 ENCOUNTER — Encounter: Payer: Self-pay | Admitting: *Deleted

## 2014-06-24 DIAGNOSIS — Z0000 Encounter for general adult medical examination without abnormal findings: Secondary | ICD-10-CM

## 2014-06-24 LAB — HEPATIC FUNCTION PANEL
ALK PHOS: 50 U/L (ref 39–117)
ALT: 26 U/L (ref 0–53)
AST: 32 U/L (ref 0–37)
Albumin: 4.2 g/dL (ref 3.5–5.2)
BILIRUBIN DIRECT: 0.1 mg/dL (ref 0.0–0.3)
BILIRUBIN TOTAL: 0.6 mg/dL (ref 0.2–1.2)
Total Protein: 6.6 g/dL (ref 6.0–8.3)

## 2014-06-24 LAB — LIPID PANEL
CHOLESTEROL: 162 mg/dL (ref 0–200)
HDL: 47.8 mg/dL (ref >39.00)
LDL Cholesterol: 80 mg/dL (ref 0–99)
NonHDL: 114.2
Total CHOL/HDL Ratio: 3
Triglycerides: 173 mg/dL — ABNORMAL HIGH (ref 0.0–149.0)
VLDL: 34.6 mg/dL (ref 0.0–40.0)

## 2014-06-24 LAB — BASIC METABOLIC PANEL
BUN: 16 mg/dL (ref 6–23)
CALCIUM: 8.9 mg/dL (ref 8.4–10.5)
CO2: 27 mEq/L (ref 19–32)
Chloride: 107 mEq/L (ref 96–112)
Creatinine, Ser: 1.1 mg/dL (ref 0.4–1.5)
GFR: 71.02 mL/min (ref >60.00)
Glucose, Bld: 101 mg/dL — ABNORMAL HIGH (ref 70–99)
POTASSIUM: 4.6 meq/L (ref 3.5–5.1)
SODIUM: 139 meq/L (ref 135–145)

## 2014-06-24 LAB — CBC WITH DIFFERENTIAL/PLATELET
BASOS ABS: 0 10*3/uL (ref 0.0–0.1)
Basophils Relative: 0.6 % (ref 0.0–3.0)
EOS PCT: 3.5 % (ref 0.0–5.0)
Eosinophils Absolute: 0.2 10*3/uL (ref 0.0–0.7)
HEMATOCRIT: 43.9 % (ref 39.0–52.0)
Hemoglobin: 15 g/dL (ref 13.0–17.0)
LYMPHS ABS: 2 10*3/uL (ref 0.7–4.0)
LYMPHS PCT: 38.4 % (ref 12.0–46.0)
MCHC: 34.2 g/dL (ref 30.0–36.0)
MCV: 97.4 fl (ref 78.0–100.0)
MONOS PCT: 11.1 % (ref 3.0–12.0)
Monocytes Absolute: 0.6 10*3/uL (ref 0.1–1.0)
Neutro Abs: 2.4 10*3/uL (ref 1.4–7.7)
Neutrophils Relative %: 46.4 % (ref 43.0–77.0)
PLATELETS: 217 10*3/uL (ref 150.0–400.0)
RBC: 4.51 Mil/uL (ref 4.22–5.81)
RDW: 12.5 % (ref 11.5–15.5)
WBC: 5.2 10*3/uL (ref 4.0–10.5)

## 2014-06-24 LAB — POCT URINALYSIS DIPSTICK
Bilirubin, UA: NEGATIVE
Blood, UA: NEGATIVE
Glucose, UA: NEGATIVE
Ketones, UA: NEGATIVE
LEUKOCYTES UA: NEGATIVE
Nitrite, UA: NEGATIVE
Spec Grav, UA: 1.015
UROBILINOGEN UA: 0.2
pH, UA: 7.5

## 2014-06-24 LAB — PSA: PSA: 1.97 ng/mL (ref 0.10–4.00)

## 2014-06-24 LAB — TSH: TSH: 1.79 u[IU]/mL (ref 0.35–4.50)

## 2014-07-01 ENCOUNTER — Encounter: Payer: Self-pay | Admitting: Internal Medicine

## 2014-07-01 ENCOUNTER — Ambulatory Visit (INDEPENDENT_AMBULATORY_CARE_PROVIDER_SITE_OTHER): Payer: BC Managed Care – PPO | Admitting: Internal Medicine

## 2014-07-01 VITALS — BP 126/80 | HR 69 | Temp 98.1°F | Resp 20 | Ht 73.0 in | Wt 206.0 lb

## 2014-07-01 DIAGNOSIS — E785 Hyperlipidemia, unspecified: Secondary | ICD-10-CM

## 2014-07-01 DIAGNOSIS — Z86010 Personal history of colonic polyps: Secondary | ICD-10-CM

## 2014-07-01 DIAGNOSIS — J309 Allergic rhinitis, unspecified: Secondary | ICD-10-CM

## 2014-07-01 DIAGNOSIS — G4700 Insomnia, unspecified: Secondary | ICD-10-CM

## 2014-07-01 DIAGNOSIS — Z0000 Encounter for general adult medical examination without abnormal findings: Secondary | ICD-10-CM

## 2014-07-01 MED ORDER — ZOLPIDEM TARTRATE 10 MG PO TABS: ORAL_TABLET | ORAL | Status: DC

## 2014-07-01 MED ORDER — TERBINAFINE HCL 250 MG PO TABS: 250.0000 mg | ORAL_TABLET | Freq: Every day | ORAL | Status: DC

## 2014-07-01 NOTE — Progress Notes (Signed)
Pre visit review using our clinic review tool, if applicable. No additional management support is needed unless otherwise documented below in the visit note. 

## 2014-07-01 NOTE — Patient Instructions (Addendum)
You need to lose weight.  Consider a lower calorie diet and regular exercise.    It is important that you exercise regularly, at least 20 minutes 3 to 4 times per week.  If you develop chest pain or shortness of breath seek  medical attention.  Health Maintenance A healthy lifestyle and preventative care can promote health and wellness.  Maintain regular health, dental, and eye exams.  Eat a healthy diet. Foods like vegetables, fruits, whole grains, low-fat dairy products, and lean protein foods contain the nutrients you need and are low in calories. Decrease your intake of foods high in solid fats, added sugars, and salt. Get information about a proper diet from your health care provider, if necessary.  Regular physical exercise is one of the most important things you can do for your health. Most adults should get at least 150 minutes of moderate-intensity exercise (any activity that increases your heart rate and causes you to sweat) each week. In addition, most adults need muscle-strengthening exercises on 2 or more days a week.   Maintain a healthy weight. The body mass index (BMI) is a screening tool to identify possible weight problems. It provides an estimate of body fat based on height and weight. Your health care provider can find your BMI and can help you achieve or maintain a healthy weight. For males 20 years and older:  A BMI below 18.5 is considered underweight.  A BMI of 18.5 to 24.9 is normal.  A BMI of 25 to 29.9 is considered overweight.  A BMI of 30 and above is considered obese.  Maintain normal blood lipids and cholesterol by exercising and minimizing your intake of saturated fat. Eat a balanced diet with plenty of fruits and vegetables. Blood tests for lipids and cholesterol should begin at age 40 and be repeated every 5 years. If your lipid or cholesterol levels are high, you are over age 35, or you are at high risk for heart disease, you may need your cholesterol levels  checked more frequently.Ongoing high lipid and cholesterol levels should be treated with medicines if diet and exercise are not working.  If you smoke, find out from your health care provider how to quit. If you do not use tobacco, do not start.  Lung cancer screening is recommended for adults aged 17-80 years who are at high risk for developing lung cancer because of a history of smoking. A yearly low-dose CT scan of the lungs is recommended for people who have at least a 30-pack-year history of smoking and are current smokers or have quit within the past 15 years. A pack year of smoking is smoking an average of 1 pack of cigarettes a day for 1 year (for example, a 30-pack-year history of smoking could mean smoking 1 pack a day for 30 years or 2 packs a day for 15 years). Yearly screening should continue until the smoker has stopped smoking for at least 15 years. Yearly screening should be stopped for people who develop a health problem that would prevent them from having lung cancer treatment.  If you choose to drink alcohol, do not have more than 2 drinks per day. One drink is considered to be 12 oz (360 mL) of beer, 5 oz (150 mL) of wine, or 1.5 oz (45 mL) of liquor.  Avoid the use of street drugs. Do not share needles with anyone. Ask for help if you need support or instructions about stopping the use of drugs.  High blood pressure  causes heart disease and increases the risk of stroke. Blood pressure should be checked at least every 1-2 years. Ongoing high blood pressure should be treated with medicines if weight loss and exercise are not effective.  If you are 41-48 years old, ask your health care provider if you should take aspirin to prevent heart disease.  Diabetes screening involves taking a blood sample to check your fasting blood sugar level. This should be done once every 3 years after age 55 if you are at a normal weight and without risk factors for diabetes. Testing should be considered  at a younger age or be carried out more frequently if you are overweight and have at least 1 risk factor for diabetes.  Colorectal cancer can be detected and often prevented. Most routine colorectal cancer screening begins at the age of 1 and continues through age 60. However, your health care provider may recommend screening at an earlier age if you have risk factors for colon cancer. On a yearly basis, your health care provider may provide home test kits to check for hidden blood in the stool. A small camera at the end of a tube may be used to directly examine the colon (sigmoidoscopy or colonoscopy) to detect the earliest forms of colorectal cancer. Talk to your health care provider about this at age 35 when routine screening begins. A direct exam of the colon should be repeated every 5-10 years through age 108, unless early forms of precancerous polyps or small growths are found.  People who are at an increased risk for hepatitis B should be screened for this virus. You are considered at high risk for hepatitis B if:  You were born in a country where hepatitis B occurs often. Talk with your health care provider about which countries are considered high risk.  Your parents were born in a high-risk country and you have not received a shot to protect against hepatitis B (hepatitis B vaccine).  You have HIV or AIDS.  You use needles to inject street drugs.  You live with, or have sex with, someone who has hepatitis B.  You are a man who has sex with other men (MSM).  You get hemodialysis treatment.  You take certain medicines for conditions like cancer, organ transplantation, and autoimmune conditions.  Hepatitis C blood testing is recommended for all people born from [birth year] through 1964 and any individual with known risk factors for hepatitis C.  Healthy men should no longer receive prostate-specific antigen (PSA) blood tests as part of routine cancer screening. Talk to your health care  provider about prostate cancer screening.  Testicular cancer screening is not recommended for adolescents or adult males who have no symptoms. Screening includes self-exam, a health care provider exam, and other screening tests. Consult with your health care provider about any symptoms you have or any concerns you have about testicular cancer.  Practice safe sex. Use condoms and avoid high-risk sexual practices to reduce the spread of sexually transmitted infections (STIs).  You should be screened for STIs, including gonorrhea and chlamydia if:  You are sexually active and are younger than 24 years.  You are older than 24 years, and your health care provider tells you that you are at risk for this type of infection.  Your sexual activity has changed since you were last screened, and you are at an increased risk for chlamydia or gonorrhea. Ask your health care provider if you are at risk.  If you are at risk of  being infected with HIV, it is recommended that you take a prescription medicine daily to prevent HIV infection. This is called pre-exposure prophylaxis (PrEP). You are considered at risk if:  You are a man who has sex with other men (MSM).  You are a heterosexual man who is sexually active with multiple partners.  You take drugs by injection.  You are sexually active with a partner who has HIV.  Talk with your health care provider about whether you are at high risk of being infected with HIV. If you choose to begin PrEP, you should first be tested for HIV. You should then be tested every 3 months for as long as you are taking PrEP.  Use sunscreen. Apply sunscreen liberally and repeatedly throughout the day. You should seek shade when your shadow is shorter than you. Protect yourself by wearing long sleeves, pants, a wide-brimmed hat, and sunglasses year round whenever you are outdoors.  Tell your health care provider of new moles or changes in moles, especially if there is a change  in shape or color. Also, tell your health care provider if a mole is larger than the size of a pencil eraser.  A one-time screening for abdominal aortic aneurysm (AAA) and surgical repair of large AAAs by ultrasound is recommended for men aged 65-75 years who are current or former smokers.  Stay current with your vaccines (immunizations). Document Released: 05/12/2008 Document Revised: 11/19/2013 Document Reviewed: 04/11/2011 [facility] Patient Information 2015 [place], [place]. This information is not intended to replace advice given to you by your health care provider. Make sure you discuss any questions you have with your health care provider. Ringworm, Nail A fungal infection of the nail (tinea unguium/onychomycosis) is common. It is common as the visible part of the nail is composed of dead cells which have no blood supply to help prevent infection. It occurs because fungi are everywhere and will pick any opportunity to grow on any dead material. Because nails are very slow growing they require up to 2 years of treatment with anti-fungal medications. The entire nail back to the base is infected. This includes approximately  of the nail which you cannot see. If your caregiver has prescribed a medication by mouth, take it every day and as directed. No progress will be seen for at least 6 to 9 months. Do not be disappointed! Because fungi live on dead cells with little or no exposure to blood supply, medication delivery to the infection is slow; thus the cure is slow. It is also why you can observe no progress in the first 6 months. The nail becoming cured is the base of the nail, as it has the blood supply. Topical medication such as creams and ointments are usually not effective. Important in successful treatment of nail fungus is closely following the medication regimen that your doctor prescribes. Sometimes you and your caregiver may elect to speed up this process by surgical removal of all the nails.  Even this may still require 6 to 9 months of additional oral medications. See your caregiver as directed. Remember there will be no visible improvement for at least 6 months. See your caregiver sooner if other signs of infection (redness and swelling) develop. Document Released: 11/11/2000 Document Revised: 02/06/2012 Document Reviewed: 01/20/2009 [facility] FargoExitCare Patient Information 2015 [place], [place]. This information is not intended to replace advice given to you by your health care provider. Make sure you discuss any questions you have with your health care provider.

## 2014-07-01 NOTE — Progress Notes (Signed)
Subjective:    Patient ID: Sean [name], male    DOB: Aug 31, 1956, 58 y.o.   MRN: [id number]  HPI  58 year old patient who is seen today for a wellness exam. He has a history colonic polyps and has had probably 5 colonoscopies since age 32.  Mother also died of complications of colon cancer.  He is now on 5 year intervals.  His last colonoscopy about a year and half ago, was free of polyps He was seen last month for a bleeding external hemorrhoid that has responded nicely to conservative care   Family history mother died of complications of colon cancer.  Father died at age 49 of an MI 2 brothers.  One sister.  One brother died of testicular cancer in his forties History reviewed. No pertinent past medical history.  History   Social History  . Marital Status: Married    Spouse Name: N/A    Number of Children: N/A  . Years of Education: N/A   Occupational History  . Not on file.   Social History Main Topics  . Smoking status: Current Some Day Smoker    Types: Cigars  . Smokeless tobacco: Never Used  . Alcohol Use: 2.4 - 3.0 oz/week    4-5 Cans of beer per week  . Drug Use: No  . Sexual Activity: Not on file   Other Topics Concern  . Not on file   Social History Narrative  . No narrative on file    History reviewed. No pertinent past surgical history.  No family history on file.  No Known Allergies  Current Outpatient Prescriptions on File Prior to Visit  Medication Sig Dispense Refill  . aspirin 81 MG tablet Take 81 mg by mouth daily.      . MULTIPLE VITAMIN PO Take 1 tablet by mouth daily.      . OMEGA-3 KRILL OIL PO Take 1 capsule by mouth daily.      . Pseudoephedrine HCl (SUDAFED 12 HOUR PO) Take 1 tablet by mouth 2 (two) times daily as needed.       No current facility-administered medications on file prior to visit.    BP 126/80  Pulse 69  Temp(Src) 98.1 F (36.7 C) (Oral)  Resp 20  Ht 6\' 1"  (1.854 m)  Wt 206 lb (93.441 kg)  BMI 27.18 kg/m2   SpO2 98%        Review of Systems  Constitutional: Negative for fever, chills, appetite change and fatigue.  HENT: Negative for congestion, dental problem, ear pain, hearing loss, sore throat, tinnitus, trouble swallowing and voice change.   Eyes: Negative for pain, discharge and visual disturbance.  Respiratory: Negative for cough, chest tightness, wheezing and stridor.   Cardiovascular: Negative for chest pain, palpitations and leg swelling.  Gastrointestinal: Negative for nausea, vomiting, abdominal pain, diarrhea, constipation, blood in stool and abdominal distention.  Genitourinary: Negative for urgency, hematuria, flank pain, discharge, difficulty urinating and genital sores.  Musculoskeletal: Negative for arthralgias, back pain, gait problem, joint swelling, myalgias and neck stiffness.  Skin: Negative for rash.  Neurological: Negative for dizziness, syncope, speech difficulty, weakness, numbness and headaches.  Hematological: Negative for adenopathy. Does not bruise/bleed easily.  Psychiatric/Behavioral: Negative for behavioral problems and dysphoric mood. The patient is not nervous/anxious.        Objective:   Physical Exam  Constitutional: He is oriented to person, place, and time. He appears well-developed and well-nourished.  HENT:  Head: Normocephalic and atraumatic.  Right Ear: External  ear normal.  Left Ear: External ear normal.  Nose: Nose normal.  Mouth/Throat: Oropharynx is clear and moist.  Eyes: Conjunctivae and EOM are normal. Pupils are equal, round, and reactive to light. No scleral icterus.  Neck: Normal range of motion. Neck supple. No JVD present. No thyromegaly present.  Cardiovascular: Normal rate, regular rhythm, normal heart sounds and intact distal pulses.  Exam reveals no gallop and no friction rub.   No murmur heard. Pulmonary/Chest: Effort normal and breath sounds normal. He exhibits no tenderness.  Abdominal: Soft. Bowel sounds are normal. He  exhibits no distension and no mass. There is no tenderness.  Genitourinary: Prostate normal and penis normal. Guaiac negative stool.  Musculoskeletal: Normal range of motion. He exhibits no edema and no tenderness.  Lymphadenopathy:    He has no cervical adenopathy.  Neurological: He is alert and oriented to person, place, and time. He has normal reflexes. No cranial nerve deficit. Coordination normal.  Skin: Skin is warm and dry. No rash noted.  Psychiatric: He has a normal mood and affect. His behavior is normal.          Assessment & Plan:  Preventive health exam History colonic polyps.  Family history of colon cancer.  We'll continue every 5 year colonoscopies History of allergic rhinitis Mild insomnia   Impaired glucose tolerance.  Weight loss encouraged  Recheck one year

## 2015-01-13 ENCOUNTER — Telehealth: Payer: Self-pay

## 2015-01-13 MED ORDER — ZOLPIDEM TARTRATE 10 MG PO TABS: ORAL_TABLET | ORAL | Status: DC

## 2015-01-13 NOTE — Telephone Encounter (Signed)
Rx called in to pharmacy. 

## 2015-01-13 NOTE — Telephone Encounter (Signed)
[facility] 10[facility]  TABLETS #30. Last filled 09/15/2014

## 2015-01-27 ENCOUNTER — Other Ambulatory Visit: Payer: Self-pay | Admitting: Physical Medicine and Rehabilitation

## 2015-01-27 DIAGNOSIS — M545 Low back pain, unspecified: Secondary | ICD-10-CM

## 2015-01-28 ENCOUNTER — Other Ambulatory Visit: Payer: Self-pay

## 2015-01-28 ENCOUNTER — Ambulatory Visit
Admission: RE | Admit: 2015-01-28 | Discharge: 2015-01-28 | Disposition: A | Discharge status: Home / Self Care | Payer: BLUE CROSS/BLUE SHIELD | Source: Ambulatory Visit | Attending: Physical Medicine and Rehabilitation | Admitting: Physical Medicine and Rehabilitation

## 2015-01-28 VITALS — BP 133/93

## 2015-01-28 DIAGNOSIS — M545 Low back pain, unspecified: Secondary | ICD-10-CM

## 2015-01-28 DIAGNOSIS — M5440 Lumbago with sciatica, unspecified side: Secondary | ICD-10-CM

## 2015-01-28 MED ORDER — METHYLPREDNISOLONE ACETATE 40 MG/ML INJ SUSP (RADIOLOG
80.0000 mg | Freq: Once | INTRAMUSCULAR | Status: AC
  Administered 2015-01-28: 80 mg via INTRA_ARTICULAR

## 2015-01-28 MED ORDER — IOHEXOL 180 MG/ML  SOLN
1.0000 mL | Freq: Once | INTRAMUSCULAR | Status: AC | PRN
  Administered 2015-01-28: 1 mL via INTRA_ARTICULAR

## 2015-01-28 NOTE — Discharge Instructions (Signed)

## 2015-01-29 ENCOUNTER — Telehealth: Payer: Self-pay | Admitting: Internal Medicine

## 2015-01-29 NOTE — Telephone Encounter (Signed)
mucinex D

## 2015-01-29 NOTE — Telephone Encounter (Signed)
Spoke to pt, told him Dynex is no longer made and Dr. Kirtland BouchardK recommended Mucinex D OTC. Pt verbalized understanding.

## 2015-01-29 NOTE — Telephone Encounter (Signed)
Called [facility] and they no longer make medication it is equavilent to Pseudoephedrine with Guafessin. Told him okay, thanks.

## 2015-01-29 NOTE — Telephone Encounter (Signed)
Dr. Kirtland BouchardK, pt is wanting refill on Dynex but it is no longer made according to pharmacy. Please advise what you can prescribe? Pt said it is to clear him up.

## 2015-01-29 NOTE — Telephone Encounter (Signed)
Pt needs refill on dynex call into [facility] [address]. This med was last prescribed by dr Lovell Sheehanjenkins

## 2015-01-29 NOTE — Telephone Encounter (Signed)
Spoke to pt asked him what this medication was for? Pt stated it would dry him up it was from Dr. Lovell SheehanJenkins in 2006. Told pt I could not find medication will check with pharmacy and get back to him. Pt verbalized understanding.

## 2015-03-18 ENCOUNTER — Other Ambulatory Visit: Payer: Self-pay | Admitting: Specialist

## 2015-03-18 DIAGNOSIS — M545 Low back pain, unspecified: Secondary | ICD-10-CM

## 2015-04-01 ENCOUNTER — Ambulatory Visit
Admission: RE | Admit: 2015-04-01 | Discharge: 2015-04-01 | Disposition: A | Discharge status: Home / Self Care | Payer: BLUE CROSS/BLUE SHIELD | Source: Ambulatory Visit | Attending: Specialist | Admitting: Specialist

## 2015-04-01 DIAGNOSIS — M545 Low back pain, unspecified: Secondary | ICD-10-CM

## 2015-04-01 MED ORDER — IOHEXOL 180 MG/ML  SOLN
1.0000 mL | Freq: Once | INTRAMUSCULAR | Status: AC | PRN
Start: 2015-04-01 — End: 2015-04-01
  Administered 2015-04-01: 1 mL via INTRA_ARTICULAR

## 2015-04-01 MED ORDER — METHYLPREDNISOLONE ACETATE 40 MG/ML INJ SUSP (RADIOLOG
120.0000 mg | Freq: Once | INTRAMUSCULAR | Status: AC
  Administered 2015-04-01: 120 mg via INTRA_ARTICULAR

## 2015-04-01 NOTE — Discharge Instructions (Signed)

## 2015-05-26 ENCOUNTER — Other Ambulatory Visit: Payer: Self-pay | Admitting: Neurological Surgery

## 2015-05-26 DIAGNOSIS — M5126 Other intervertebral disc displacement, lumbar region: Secondary | ICD-10-CM

## 2015-05-28 ENCOUNTER — Ambulatory Visit
Admission: RE | Admit: 2015-05-28 | Discharge: 2015-05-28 | Disposition: A | Discharge status: Home / Self Care | Payer: BLUE CROSS/BLUE SHIELD | Source: Ambulatory Visit | Attending: Neurological Surgery | Admitting: Neurological Surgery

## 2015-05-28 VITALS — BP 122/80 | HR 61

## 2015-05-28 DIAGNOSIS — M47816 Spondylosis without myelopathy or radiculopathy, lumbar region: Secondary | ICD-10-CM

## 2015-05-28 DIAGNOSIS — M5126 Other intervertebral disc displacement, lumbar region: Secondary | ICD-10-CM

## 2015-05-28 MED ORDER — IOHEXOL 180 MG/ML  SOLN
1.0000 mL | Freq: Once | INTRAMUSCULAR | Status: AC | PRN
Start: 2015-05-28 — End: 2015-05-28
  Administered 2015-05-28: 1 mL via INTRA_ARTICULAR

## 2015-05-28 MED ORDER — METHYLPREDNISOLONE ACETATE 40 MG/ML INJ SUSP (RADIOLOG
120.0000 mg | Freq: Once | INTRAMUSCULAR | Status: AC
  Administered 2015-05-28: 120 mg via INTRA_ARTICULAR

## 2015-07-01 ENCOUNTER — Other Ambulatory Visit (INDEPENDENT_AMBULATORY_CARE_PROVIDER_SITE_OTHER): Payer: BLUE CROSS/BLUE SHIELD

## 2015-07-01 DIAGNOSIS — Z0000 Encounter for general adult medical examination without abnormal findings: Secondary | ICD-10-CM | POA: Diagnosis not present

## 2015-07-01 LAB — HEPATIC FUNCTION PANEL
ALBUMIN: 4.3 g/dL (ref 3.5–5.2)
ALT: 21 U/L (ref 0–53)
AST: 22 U/L (ref 0–37)
Alkaline Phosphatase: 45 U/L (ref 39–117)
BILIRUBIN TOTAL: 0.5 mg/dL (ref 0.2–1.2)
Bilirubin, Direct: 0.1 mg/dL (ref 0.0–0.3)
Total Protein: 6.4 g/dL (ref 6.0–8.3)

## 2015-07-01 LAB — POCT URINALYSIS DIPSTICK
Bilirubin, UA: NEGATIVE
Blood, UA: NEGATIVE
GLUCOSE UA: NEGATIVE
KETONES UA: NEGATIVE
Leukocytes, UA: NEGATIVE
Nitrite, UA: NEGATIVE
PH UA: 5.5
Protein, UA: NEGATIVE
Spec Grav, UA: 1.02
Urobilinogen, UA: 0.2

## 2015-07-01 LAB — CBC WITH DIFFERENTIAL/PLATELET
BASOS ABS: 0 10*3/uL (ref 0.0–0.1)
Basophils Relative: 0.7 % (ref 0.0–3.0)
Eosinophils Absolute: 0.2 10*3/uL (ref 0.0–0.7)
Eosinophils Relative: 2.9 % (ref 0.0–5.0)
HCT: 43.3 % (ref 39.0–52.0)
HEMOGLOBIN: 15 g/dL (ref 13.0–17.0)
Lymphocytes Relative: 34.3 % (ref 12.0–46.0)
Lymphs Abs: 1.8 10*3/uL (ref 0.7–4.0)
MCHC: 34.5 g/dL (ref 30.0–36.0)
MCV: 96.2 fl (ref 78.0–100.0)
Monocytes Absolute: 0.6 10*3/uL (ref 0.1–1.0)
Monocytes Relative: 12 % (ref 3.0–12.0)
NEUTROS ABS: 2.6 10*3/uL (ref 1.4–7.7)
Neutrophils Relative %: 50.1 % (ref 43.0–77.0)
Platelets: 183 10*3/uL (ref 150.0–400.0)
RBC: 4.5 Mil/uL (ref 4.22–5.81)
RDW: 13.4 % (ref 11.5–15.5)
WBC: 5.2 10*3/uL (ref 4.0–10.5)

## 2015-07-01 LAB — LIPID PANEL
Cholesterol: 185 mg/dL (ref 0–200)
HDL: 61.9 mg/dL (ref >39.00)
LDL Cholesterol: 93 mg/dL (ref 0–99)
NONHDL: 122.75
Total CHOL/HDL Ratio: 3
Triglycerides: 147 mg/dL (ref 0.0–149.0)
VLDL: 29.4 mg/dL (ref 0.0–40.0)

## 2015-07-01 LAB — BASIC METABOLIC PANEL
BUN: 14 mg/dL (ref 6–23)
CO2: 27 mEq/L (ref 19–32)
Calcium: 9 mg/dL (ref 8.4–10.5)
Chloride: 104 mEq/L (ref 96–112)
Creatinine, Ser: 1.01 mg/dL (ref 0.40–1.50)
GFR: 80.55 mL/min (ref >60.00)
Glucose, Bld: 94 mg/dL (ref 70–99)
Potassium: 4.2 mEq/L (ref 3.5–5.1)
SODIUM: 138 meq/L (ref 135–145)

## 2015-07-01 LAB — TSH: TSH: 2.47 u[IU]/mL (ref 0.35–4.50)

## 2015-07-01 LAB — PSA: PSA: 1.89 ng/mL (ref 0.10–4.00)

## 2015-07-08 ENCOUNTER — Encounter: Payer: Self-pay | Admitting: Internal Medicine

## 2015-07-08 ENCOUNTER — Ambulatory Visit (INDEPENDENT_AMBULATORY_CARE_PROVIDER_SITE_OTHER): Payer: BLUE CROSS/BLUE SHIELD | Admitting: Internal Medicine

## 2015-07-08 VITALS — BP 130/90 | HR 68 | Temp 98.3°F | Ht 73.25 in | Wt 201.0 lb

## 2015-07-08 DIAGNOSIS — Z0000 Encounter for general adult medical examination without abnormal findings: Secondary | ICD-10-CM

## 2015-07-08 DIAGNOSIS — E785 Hyperlipidemia, unspecified: Secondary | ICD-10-CM

## 2015-07-08 DIAGNOSIS — J3089 Other allergic rhinitis: Secondary | ICD-10-CM | POA: Diagnosis not present

## 2015-07-08 DIAGNOSIS — Z86010 Personal history of colonic polyps: Secondary | ICD-10-CM | POA: Diagnosis not present

## 2015-07-08 NOTE — Progress Notes (Signed)
Subjective:    Patient ID: Sean [name], male    DOB: 30-Apr-1956, 59 y.o.   MRN: [id number]  HPI 59 year-old patient who is seen today for a wellness exam. He has a history colonic polyps and has had probably 5 colonoscopies since age 83.  Mother also died of complications of colon cancer.  He is now on 5 year intervals.  His last colonoscopy about 2  year and half ago, was free of polyps    Family history mother died of complications of colon cancer.  Father died at age 55 of an MI 2 brothers.  One sister.  One brother died of testicular cancer in his forties No past medical history on file.  Social History   Social History  . Marital Status: Married    Spouse Name: N/A  . Number of Children: N/A  . Years of Education: N/A   Occupational History  . Not on file.   Social History Main Topics  . Smoking status: Current Some Day Smoker    Types: Cigars  . Smokeless tobacco: Never Used  . Alcohol Use: 2.4 - 3.0 oz/week    4-5 Cans of beer per week  . Drug Use: No  . Sexual Activity: Not on file   Other Topics Concern  . Not on file   Social History Narrative    No past surgical history on file.  No family history on file.  No Known Allergies  Current Outpatient Prescriptions on File Prior to Visit  Medication Sig Dispense Refill  . aspirin 81 MG tablet Take 81 mg by mouth daily.    . Multiple Vitamins-Minerals (CENTRUM SILVER ADULT 50+ PO) Take by mouth.    . OMEGA-3 KRILL OIL PO Take 1 capsule by mouth daily.    Marland Kitchen terbinafine (LAMISIL) 250 MG tablet Take 1 tablet (250 mg total) by mouth daily. 90 tablet 0  . zolpidem (AMBIEN) 10 MG tablet TAKE ONE TABLET BY MOUTH AT BEDTIME AS NEEDED 30 tablet 2  . Pseudoephedrine HCl (SUDAFED 12 HOUR PO) Take 1 tablet by mouth 2 (two) times daily as needed.     No current facility-administered medications on file prior to visit.    BP 130/90 mmHg  Pulse 68  Temp(Src) 98.3 F (36.8 C) (Oral)  Ht 6' 1.25" (1.861 m)  Wt  201 lb (91.173 kg)  BMI 26.33 kg/m2    Social history.  Avid scuba diver and skier    Review of Systems  Constitutional: Negative for fever, chills, appetite change and fatigue.  HENT: Negative for congestion, dental problem, ear pain, hearing loss, sore throat, tinnitus, trouble swallowing and voice change.   Eyes: Negative for pain, discharge and visual disturbance.  Respiratory: Negative for cough, chest tightness, wheezing and stridor.   Cardiovascular: Negative for chest pain, palpitations and leg swelling.  Gastrointestinal: Negative for nausea, vomiting, abdominal pain, diarrhea, constipation, blood in stool and abdominal distention.  Genitourinary: Negative for urgency, hematuria, flank pain, discharge, difficulty urinating and genital sores.  Musculoskeletal: Negative for myalgias, back pain, joint swelling, arthralgias, gait problem and neck stiffness.  Skin: Negative for rash.  Neurological: Negative for dizziness, syncope, speech difficulty, weakness, numbness and headaches.  Hematological: Negative for adenopathy. Does not bruise/bleed easily.  Psychiatric/Behavioral: Negative for behavioral problems and dysphoric mood. The patient is not nervous/anxious.        Objective:   Physical Exam  Constitutional: He is oriented to person, place, and time. He appears well-developed and well-nourished.  HENT:  Head: Normocephalic and atraumatic.  Right Ear: External ear normal.  Left Ear: External ear normal.  Nose: Nose normal.  Mouth/Throat: Oropharynx is clear and moist.  Eyes: Conjunctivae and EOM are normal. Pupils are equal, round, and reactive to light. No scleral icterus.  Neck: Normal range of motion. Neck supple. No JVD present. No thyromegaly present.  Cardiovascular: Normal rate, regular rhythm, normal heart sounds and intact distal pulses.  Exam reveals no gallop and no friction rub.   No murmur heard. Pulmonary/Chest: Effort normal and breath sounds normal. He  exhibits no tenderness.  Abdominal: Soft. Bowel sounds are normal. He exhibits no distension and no mass. There is no tenderness.  Genitourinary: Prostate normal and penis normal. Guaiac negative stool.  Musculoskeletal: Normal range of motion. He exhibits no edema or tenderness.  Lymphadenopathy:    He has no cervical adenopathy.  Neurological: He is alert and oriented to person, place, and time. He has normal reflexes. No cranial nerve deficit. Coordination normal.  Skin: Skin is warm and dry. No rash noted.  Psychiatric: He has a normal mood and affect. His behavior is normal.          Assessment & Plan:  Preventive health exam History colonic polyps.  Family history of colon cancer.  We'll continue every 5 year colonoscopies History of allergic rhinitis Mild insomnia    Recheck one year

## 2015-07-08 NOTE — Patient Instructions (Signed)
It is important that you exercise regularly, at least 20 minutes 3 to 4 times per week.  If you develop chest pain or shortness of breath seek  medical attention.  Health Maintenance A healthy lifestyle and preventative care can promote health and wellness.  Maintain regular health, dental, and eye exams.  Eat a healthy diet. Foods like vegetables, fruits, whole grains, low-fat dairy products, and lean protein foods contain the nutrients you need and are low in calories. Decrease your intake of foods high in solid fats, added sugars, and salt. Get information about a proper diet from your health care provider, if necessary.  Regular physical exercise is one of the most important things you can do for your health. Most adults should get at least 150 minutes of moderate-intensity exercise (any activity that increases your heart rate and causes you to sweat) each week. In addition, most adults need muscle-strengthening exercises on 2 or more days a week.   Maintain a healthy weight. The body mass index (BMI) is a screening tool to identify possible weight problems. It provides an estimate of body fat based on height and weight. Your health care provider can find your BMI and can help you achieve or maintain a healthy weight. For males 20 years and older:  A BMI below 18.5 is considered underweight.  A BMI of 18.5 to 24.9 is normal.  A BMI of 25 to 29.9 is considered overweight.  A BMI of 30 and above is considered obese.  Maintain normal blood lipids and cholesterol by exercising and minimizing your intake of saturated fat. Eat a balanced diet with plenty of fruits and vegetables. Blood tests for lipids and cholesterol should begin at age 20 and be repeated every 5 years. If your lipid or cholesterol levels are high, you are over age 50, or you are at high risk for heart disease, you may need your cholesterol levels checked more frequently.Ongoing high lipid and cholesterol levels should be  treated with medicines if diet and exercise are not working.  If you smoke, find out from your health care provider how to quit. If you do not use tobacco, do not start.  Lung cancer screening is recommended for adults aged 55-80 years who are at high risk for developing lung cancer because of a history of smoking. A yearly low-dose CT scan of the lungs is recommended for people who have at least a 30-pack-year history of smoking and are current smokers or have quit within the past 15 years. A pack year of smoking is smoking an average of 1 pack of cigarettes a day for 1 year (for example, a 30-pack-year history of smoking could mean smoking 1 pack a day for 30 years or 2 packs a day for 15 years). Yearly screening should continue until the smoker has stopped smoking for at least 15 years. Yearly screening should be stopped for people who develop a health problem that would prevent them from having lung cancer treatment.  If you choose to drink alcohol, do not have more than 2 drinks per day. One drink is considered to be 12 oz (360 mL) of beer, 5 oz (150 mL) of wine, or 1.5 oz (45 mL) of liquor.  Avoid the use of street drugs. Do not share needles with anyone. Ask for help if you need support or instructions about stopping the use of drugs.  High blood pressure causes heart disease and increases the risk of stroke. Blood pressure should be checked at least every   1-2 years. Ongoing high blood pressure should be treated with medicines if weight loss and exercise are not effective.  If you are 45-79 years old, ask your health care provider if you should take aspirin to prevent heart disease.  Diabetes screening involves taking a blood sample to check your fasting blood sugar level. This should be done once every 3 years after age 45 if you are at a normal weight and without risk factors for diabetes. Testing should be considered at a younger age or be carried out more frequently if you are overweight and  have at least 1 risk factor for diabetes.  Colorectal cancer can be detected and often prevented. Most routine colorectal cancer screening begins at the age of 50 and continues through age 75. However, your health care provider may recommend screening at an earlier age if you have risk factors for colon cancer. On a yearly basis, your health care provider may provide home test kits to check for hidden blood in the stool. A small camera at the end of a tube may be used to directly examine the colon (sigmoidoscopy or colonoscopy) to detect the earliest forms of colorectal cancer. Talk to your health care provider about this at age 50 when routine screening begins. A direct exam of the colon should be repeated every 5-10 years through age 75, unless early forms of precancerous polyps or small growths are found.  People who are at an increased risk for hepatitis B should be screened for this virus. You are considered at high risk for hepatitis B if:  You were born in a country where hepatitis B occurs often. Talk with your health care provider about which countries are considered high risk.  Your parents were born in a high-risk country and you have not received a shot to protect against hepatitis B (hepatitis B vaccine).  You have HIV or AIDS.  You use needles to inject street drugs.  You live with, or have sex with, someone who has hepatitis B.  You are a man who has sex with other men (MSM).  You get hemodialysis treatment.  You take certain medicines for conditions like cancer, organ transplantation, and autoimmune conditions.  Hepatitis C blood testing is recommended for all people born from 1945 through 1965 and any individual with known risk factors for hepatitis C.  Healthy men should no longer receive prostate-specific antigen (PSA) blood tests as part of routine cancer screening. Talk to your health care provider about prostate cancer screening.  Testicular cancer screening is not  recommended for adolescents or adult males who have no symptoms. Screening includes self-exam, a health care provider exam, and other screening tests. Consult with your health care provider about any symptoms you have or any concerns you have about testicular cancer.  Practice safe sex. Use condoms and avoid high-risk sexual practices to reduce the spread of sexually transmitted infections (STIs).  You should be screened for STIs, including gonorrhea and chlamydia if:  You are sexually active and are younger than 24 years.  You are older than 24 years, and your health care provider tells you that you are at risk for this type of infection.  Your sexual activity has changed since you were last screened, and you are at an increased risk for chlamydia or gonorrhea. Ask your health care provider if you are at risk.  If you are at risk of being infected with HIV, it is recommended that you take a prescription medicine daily to prevent HIV   infection. This is called pre-exposure prophylaxis (PrEP). You are considered at risk if:  You are a man who has sex with other men (MSM).  You are a heterosexual man who is sexually active with multiple partners.  You take drugs by injection.  You are sexually active with a partner who has HIV.  Talk with your health care provider about whether you are at high risk of being infected with HIV. If you choose to begin PrEP, you should first be tested for HIV. You should then be tested every 3 months for as long as you are taking PrEP.  Use sunscreen. Apply sunscreen liberally and repeatedly throughout the day. You should seek shade when your shadow is shorter than you. Protect yourself by wearing long sleeves, pants, a wide-brimmed hat, and sunglasses year round whenever you are outdoors.  Tell your health care provider of new moles or changes in moles, especially if there is a change in shape or color. Also, tell your health care provider if a mole is larger  than the size of a pencil eraser.  A one-time screening for abdominal aortic aneurysm (AAA) and surgical repair of large AAAs by ultrasound is recommended for men aged 65-75 years who are current or former smokers.  Stay current with your vaccines (immunizations). Document Released: 05/12/2008 Document Revised: 11/19/2013 Document Reviewed: 04/11/2011 Exit[employer] Patient Information 2015 [employer]. This information is not intended to replace advice given to you by your health care provider. Make sure you discuss any questions you have with your health care provider.  

## 2015-07-08 NOTE — Progress Notes (Signed)
Pre visit review using our clinic review tool, if applicable. No additional management support is needed unless otherwise documented below in the visit note. 

## 2015-07-13 ENCOUNTER — Other Ambulatory Visit: Payer: Self-pay | Admitting: Internal Medicine

## 2015-07-28 ENCOUNTER — Other Ambulatory Visit: Payer: Self-pay | Admitting: Neurosurgery

## 2015-07-28 DIAGNOSIS — M4726 Other spondylosis with radiculopathy, lumbar region: Secondary | ICD-10-CM

## 2015-07-29 ENCOUNTER — Ambulatory Visit
Admission: RE | Admit: 2015-07-29 | Discharge: 2015-07-29 | Disposition: A | Discharge status: Home / Self Care | Payer: BLUE CROSS/BLUE SHIELD | Source: Ambulatory Visit | Attending: Neurosurgery | Admitting: Neurosurgery

## 2015-07-29 DIAGNOSIS — M4726 Other spondylosis with radiculopathy, lumbar region: Secondary | ICD-10-CM

## 2015-07-29 MED ORDER — METHYLPREDNISOLONE ACETATE 40 MG/ML INJ SUSP (RADIOLOG
120.0000 mg | Freq: Once | INTRAMUSCULAR | Status: AC
  Administered 2015-07-29: 120 mg via INTRA_ARTICULAR

## 2015-07-29 MED ORDER — IOHEXOL 180 MG/ML  SOLN
1.0000 mL | Freq: Once | INTRAMUSCULAR | Status: DC | PRN
  Administered 2015-07-29: 1 mL via INTRA_ARTICULAR

## 2016-02-17 ENCOUNTER — Other Ambulatory Visit: Payer: Self-pay | Admitting: Internal Medicine

## 2016-04-01 ENCOUNTER — Telehealth: Payer: Self-pay | Admitting: Internal Medicine

## 2016-04-01 MED ORDER — ESCITALOPRAM OXALATE 10 MG PO TABS: 10.0000 mg | ORAL_TABLET | Freq: Every day | ORAL | Status: DC

## 2016-04-01 NOTE — Telephone Encounter (Signed)
Okay to fill Lexapro 10 mg for pt?

## 2016-04-01 NOTE — Telephone Encounter (Signed)
Okay to refill generic Lexapro

## 2016-04-01 NOTE — Telephone Encounter (Signed)
Spoke to pt's wife Marylu LundJanet, told her Rx was sent to pharmacy as requested. Marylu LundJanet verbalized understanding and will let pt know.

## 2016-04-01 NOTE — Telephone Encounter (Signed)
Pt would like new rx generic  lexapro 10 mg. Pt has not taken this med is over a year. [facility] [address]

## 2016-04-01 NOTE — Addendum Note (Signed)
Addended by: Jimmye NormanPHANOS, Stephan Draughn J on: 04/01/2016 02:57 PM   Modules accepted: Orders

## 2016-04-12 DIAGNOSIS — M5416 Radiculopathy, lumbar region: Secondary | ICD-10-CM | POA: Diagnosis not present

## 2016-04-12 DIAGNOSIS — M7138 Other bursal cyst, other site: Secondary | ICD-10-CM | POA: Diagnosis not present

## 2016-04-12 DIAGNOSIS — M5136 Other intervertebral disc degeneration, lumbar region: Secondary | ICD-10-CM | POA: Diagnosis not present

## 2016-04-12 DIAGNOSIS — M4726 Other spondylosis with radiculopathy, lumbar region: Secondary | ICD-10-CM | POA: Diagnosis not present

## 2016-05-28 DIAGNOSIS — M79631 Pain in right forearm: Secondary | ICD-10-CM | POA: Diagnosis not present

## 2016-05-28 DIAGNOSIS — S5011XA Contusion of right forearm, initial encounter: Secondary | ICD-10-CM | POA: Diagnosis not present

## 2016-07-18 ENCOUNTER — Other Ambulatory Visit: Payer: Self-pay | Admitting: Internal Medicine

## 2016-07-19 ENCOUNTER — Other Ambulatory Visit: Payer: BLUE CROSS/BLUE SHIELD

## 2016-07-19 NOTE — Telephone Encounter (Signed)
Rx refill sent to pharmacy. 

## 2016-07-19 NOTE — Telephone Encounter (Signed)
Yes thanks, can provide #30 but needs to see Dr. Kirtland BouchardK for any further refills

## 2016-07-26 ENCOUNTER — Ambulatory Visit (INDEPENDENT_AMBULATORY_CARE_PROVIDER_SITE_OTHER): Payer: BLUE CROSS/BLUE SHIELD | Admitting: Internal Medicine

## 2016-07-26 ENCOUNTER — Encounter: Payer: Self-pay | Admitting: Internal Medicine

## 2016-07-26 VITALS — BP 120/84 | HR 65 | Temp 98.0°F | Ht 72.25 in | Wt 205.0 lb

## 2016-07-26 DIAGNOSIS — Z0000 Encounter for general adult medical examination without abnormal findings: Secondary | ICD-10-CM

## 2016-07-26 DIAGNOSIS — Z00129 Encounter for routine child health examination without abnormal findings: Secondary | ICD-10-CM

## 2016-07-26 NOTE — Progress Notes (Signed)
Pre visit review using our clinic review tool, if applicable. No additional management support is needed unless otherwise documented below in the visit note. 

## 2016-07-26 NOTE — Patient Instructions (Addendum)
It is important that you exercise regularly, at least 20 minutes 3 to 4 times per week.  If you develop chest pain or shortness of breath seek  medical attention.  Continue colonoscopies at five-year intervals  Return in one year for follow-up   Health Maintenance, Male A healthy lifestyle and preventative care can promote health and wellness.  Maintain regular health, dental, and eye exams.  Eat a healthy diet. Foods like vegetables, fruits, whole grains, low-fat dairy products, and lean protein foods contain the nutrients you need and are low in calories. Decrease your intake of foods high in solid fats, added sugars, and salt. Get information about a proper diet from your health care provider, if necessary.  Regular physical exercise is one of the most important things you can do for your health. Most adults should get at least 150 minutes of moderate-intensity exercise (any activity that increases your heart rate and causes you to sweat) each week. In addition, most adults need muscle-strengthening exercises on 2 or more days a week.   Maintain a healthy weight. The body mass index (BMI) is a screening tool to identify possible weight problems. It provides an estimate of body fat based on height and weight. Your health care provider can find your BMI and can help you achieve or maintain a healthy weight. For males 20 years and older:  A BMI below 18.5 is considered underweight.  A BMI of 18.5 to 24.9 is normal.  A BMI of 25 to 29.9 is considered overweight.  A BMI of 30 and above is considered obese.  Maintain normal blood lipids and cholesterol by exercising and minimizing your intake of saturated fat. Eat a balanced diet with plenty of fruits and vegetables. Blood tests for lipids and cholesterol should begin at age 59 and be repeated every 5 years. If your lipid or cholesterol levels are high, you are over age 59, or you are at high risk for heart disease, you may need your  cholesterol levels checked more frequently.Ongoing high lipid and cholesterol levels should be treated with medicines if diet and exercise are not working.  If you smoke, find out from your health care provider how to quit. If you do not use tobacco, do not start.  Lung cancer screening is recommended for adults aged 55-80 years who are at high risk for developing lung cancer because of a history of smoking. A yearly low-dose CT scan of the lungs is recommended for people who have at least a 30-pack-year history of smoking and are current smokers or have quit within the past 15 years. A pack year of smoking is smoking an average of 1 pack of cigarettes a day for 1 year (for example, a 30-pack-year history of smoking could mean smoking 1 pack a day for 30 years or 2 packs a day for 15 years). Yearly screening should continue until the smoker has stopped smoking for at least 15 years. Yearly screening should be stopped for people who develop a health problem that would prevent them from having lung cancer treatment.  If you choose to drink alcohol, do not have more than 2 drinks per day. One drink is considered to be 12 oz (360 mL) of beer, 5 oz (150 mL) of wine, or 1.5 oz (45 mL) of liquor.  Avoid the use of street drugs. Do not share needles with anyone. Ask for help if you need support or instructions about stopping the use of drugs.  High blood pressure causes heart  disease and increases the risk of stroke. High blood pressure is more likely to develop in:  People who have blood pressure in the end of the normal range (100-139/85-89 mm Hg).  People who are overweight or obese.  People who are African American.  If you are 78-67 years of age, have your blood pressure checked every 3-5 years. If you are 25 years of age or older, have your blood pressure checked every year. You should have your blood pressure measured twice--once when you are at a hospital or clinic, and once when you are not at a  hospital or clinic. Record the average of the two measurements. To check your blood pressure when you are not at a hospital or clinic, you can use:  An automated blood pressure machine at a pharmacy.  A home blood pressure monitor.  If you are 68-28 years old, ask your health care provider if you should take aspirin to prevent heart disease.  Diabetes screening involves taking a blood sample to check your fasting blood sugar level. This should be done once every 3 years after age 101 if you are at a normal weight and without risk factors for diabetes. Testing should be considered at a younger age or be carried out more frequently if you are overweight and have at least 1 risk factor for diabetes.  Colorectal cancer can be detected and often prevented. Most routine colorectal cancer screening begins at the age of 67 and continues through age 82. However, your health care provider may recommend screening at an earlier age if you have risk factors for colon cancer. On a yearly basis, your health care provider may provide home test kits to check for hidden blood in the stool. A small camera at the end of a tube may be used to directly examine the colon (sigmoidoscopy or colonoscopy) to detect the earliest forms of colorectal cancer. Talk to your health care provider about this at age 37 when routine screening begins. A direct exam of the colon should be repeated every 5-10 years through age 68, unless early forms of precancerous polyps or small growths are found.  People who are at an increased risk for hepatitis B should be screened for this virus. You are considered at high risk for hepatitis B if:  You were born in a country where hepatitis B occurs often. Talk with your health care provider about which countries are considered high risk.  Your parents were born in a high-risk country and you have not received a shot to protect against hepatitis B (hepatitis B vaccine).  You have HIV or AIDS.  You  use needles to inject street drugs.  You live with, or have sex with, someone who has hepatitis B.  You are a man who has sex with other men (MSM).  You get hemodialysis treatment.  You take certain medicines for conditions like cancer, organ transplantation, and autoimmune conditions.  Hepatitis C blood testing is recommended for all people born from [birth year] through 1965 and any individual with known risk factors for hepatitis C.  Healthy men should no longer receive prostate-specific antigen (PSA) blood tests as part of routine cancer screening. Talk to your health care provider about prostate cancer screening.  Testicular cancer screening is not recommended for adolescents or adult males who have no symptoms. Screening includes self-exam, a health care provider exam, and other screening tests. Consult with your health care provider about any symptoms you have or any concerns you have about testicular  cancer.  Practice safe sex. Use condoms and avoid high-risk sexual practices to reduce the spread of sexually transmitted infections (STIs).  You should be screened for STIs, including gonorrhea and chlamydia if:  You are sexually active and are younger than 24 years.  You are older than 24 years, and your health care provider tells you that you are at risk for this type of infection.  Your sexual activity has changed since you were last screened, and you are at an increased risk for chlamydia or gonorrhea. Ask your health care provider if you are at risk.  If you are at risk of being infected with HIV, it is recommended that you take a prescription medicine daily to prevent HIV infection. This is called pre-exposure prophylaxis (PrEP). You are considered at risk if:  You are a man who has sex with other men (MSM).  You are a heterosexual man who is sexually active with multiple partners.  You take drugs by injection.  You are sexually active with a partner who has HIV.  Talk with  your health care provider about whether you are at high risk of being infected with HIV. If you choose to begin PrEP, you should first be tested for HIV. You should then be tested every 3 months for as long as you are taking PrEP.  Use sunscreen. Apply sunscreen liberally and repeatedly throughout the day. You should seek shade when your shadow is shorter than you. Protect yourself by wearing long sleeves, pants, a wide-brimmed hat, and sunglasses year round whenever you are outdoors.  Tell your health care provider of new moles or changes in moles, especially if there is a change in shape or color. Also, tell your health care provider if a mole is larger than the size of a pencil eraser.  A one-time screening for abdominal aortic aneurysm (AAA) and surgical repair of large AAAs by ultrasound is recommended for men aged 65-75 years who are current or former smokers.  Stay current with your vaccines (immunizations).   This information is not intended to replace advice given to you by your health care provider. Make sure you discuss any questions you have with your health care provider.   Document Released: 05/12/2008 Document Revised: 12/05/2014 Document Reviewed: 04/11/2011 Elsevier Interactive Patient Education [employer]

## 2016-07-26 NOTE — Progress Notes (Signed)
Subjective:    Patient ID: Sean [name], male    DOB: Jan 08, 1956, 60 y.o.   MRN: [id number]  HPI 60  year-old patient who is seen today for a wellness exam. He has a history colonic polyps and has had probably 5 colonoscopies since age 30.  Mother also died of complications of colon cancer.  He is now on 5 year intervals.  His last colonoscopy about 3   year and half ago, was free of polyps  Avid skier and diver  Family history mother died of complications of colon cancer.  Father died at age 31 of an MI 2 brothers.  One sister.  One brother died of testicular cancer in his forties No past medical history on file.  Social History   Social History  . Marital status: Married    Spouse name: N/A  . Number of children: N/A  . Years of education: N/A   Occupational History  . Not on file.   Social History Main Topics  . Smoking status: Current Some Day Smoker    Types: Cigars  . Smokeless tobacco: Never Used  . Alcohol use 2.4 - 3.0 oz/week    4 - 5 Cans of beer per week  . Drug use: No  . Sexual activity: Not on file   Other Topics Concern  . Not on file   Social History Narrative  . No narrative on file    No past surgical history on file.  No family history on file.  No Known Allergies  Current Outpatient Prescriptions on File Prior to Visit  Medication Sig Dispense Refill  . aspirin 81 MG tablet Take 81 mg by mouth daily.    Marland Kitchen escitalopram (LEXAPRO) 10 MG tablet Take 1 tablet (10 mg total) by mouth daily. 90 tablet 1  . Multiple Vitamins-Minerals (CENTRUM SILVER ADULT 50+ PO) Take by mouth.    . OMEGA-3 KRILL OIL PO Take 1 capsule by mouth daily.    . Pseudoephedrine HCl (SUDAFED 12 HOUR PO) Take 1 tablet by mouth 2 (two) times daily as needed.    . zolpidem (AMBIEN) 10 MG tablet TAKE 1 TABLET BY MOUTH AT BEDTIME 30 tablet 0   No current facility-administered medications on file prior to visit.     BP 120/84 (BP Location: Left Arm, Patient Position:  Sitting, Cuff Size: Normal)   Pulse 65   Temp 98 F (36.7 C) (Oral)   Ht 6' 0.25" (1.835 m)   Wt 205 lb (93 kg)   SpO2 98%   BMI 27.61 kg/m     Social history.  Avid scuba diver and skier    Review of Systems  Constitutional: Negative for appetite change, chills, fatigue and fever.  HENT: Negative for congestion, dental problem, ear pain, hearing loss, sore throat, tinnitus, trouble swallowing and voice change.   Eyes: Negative for pain, discharge and visual disturbance.  Respiratory: Negative for cough, chest tightness, wheezing and stridor.   Cardiovascular: Negative for chest pain, palpitations and leg swelling.  Gastrointestinal: Negative for abdominal distention, abdominal pain, blood in stool, constipation, diarrhea, nausea and vomiting.  Genitourinary: Negative for difficulty urinating, discharge, flank pain, genital sores, hematuria and urgency.  Musculoskeletal: Negative for arthralgias, back pain, gait problem, joint swelling, myalgias and neck stiffness.  Skin: Negative for rash.  Neurological: Negative for dizziness, syncope, speech difficulty, weakness, numbness and headaches.  Hematological: Negative for adenopathy. Does not bruise/bleed easily.  Psychiatric/Behavioral: Negative for behavioral problems and dysphoric mood. The patient  is not nervous/anxious.        Objective:   Physical Exam  Constitutional: He is oriented to person, place, and time. He appears well-developed and well-nourished.  HENT:  Head: Normocephalic and atraumatic.  Right Ear: External ear normal.  Left Ear: External ear normal.  Nose: Nose normal.  Mouth/Throat: Oropharynx is clear and moist.  Eyes: Conjunctivae and EOM are normal. Pupils are equal, round, and reactive to light. No scleral icterus.  Neck: Normal range of motion. Neck supple. No JVD present. No thyromegaly present.  Cardiovascular: Normal rate, regular rhythm, normal heart sounds and intact distal pulses.  Exam reveals  no gallop and no friction rub.   No murmur heard. Pulmonary/Chest: Effort normal and breath sounds normal. He exhibits no tenderness.  Abdominal: Soft. Bowel sounds are normal. He exhibits no distension and no mass. There is no tenderness.  Genitourinary: Prostate normal and penis normal. Rectal exam shows guaiac negative stool.  Musculoskeletal: Normal range of motion. He exhibits no edema or tenderness.  Lymphadenopathy:    He has no cervical adenopathy.  Neurological: He is alert and oriented to person, place, and time. He has normal reflexes. No cranial nerve deficit. Coordination normal.  Skin: Skin is warm and dry. No rash noted.  Psychiatric: He has a normal mood and affect. His behavior is normal.          Assessment & Plan:  Preventive health exam History colonic polyps.  Family history of colon cancer.  We'll continue every 5 year colonoscopies History of allergic rhinitis     Recheck one year

## 2016-07-28 DIAGNOSIS — D3131 Benign neoplasm of right choroid: Secondary | ICD-10-CM | POA: Diagnosis not present

## 2016-07-28 DIAGNOSIS — H25012 Cortical age-related cataract, left eye: Secondary | ICD-10-CM | POA: Diagnosis not present

## 2016-09-05 DIAGNOSIS — L738 Other specified follicular disorders: Secondary | ICD-10-CM | POA: Diagnosis not present

## 2016-09-19 ENCOUNTER — Other Ambulatory Visit: Payer: Self-pay | Admitting: Internal Medicine

## 2016-10-25 DIAGNOSIS — D225 Melanocytic nevi of trunk: Secondary | ICD-10-CM | POA: Diagnosis not present

## 2016-10-25 DIAGNOSIS — C44311 Basal cell carcinoma of skin of nose: Secondary | ICD-10-CM | POA: Diagnosis not present

## 2016-10-25 DIAGNOSIS — D2362 Other benign neoplasm of skin of left upper limb, including shoulder: Secondary | ICD-10-CM | POA: Diagnosis not present

## 2016-10-25 DIAGNOSIS — Z85828 Personal history of other malignant neoplasm of skin: Secondary | ICD-10-CM | POA: Diagnosis not present

## 2016-10-25 DIAGNOSIS — L821 Other seborrheic keratosis: Secondary | ICD-10-CM | POA: Diagnosis not present

## 2017-01-13 DIAGNOSIS — M5416 Radiculopathy, lumbar region: Secondary | ICD-10-CM | POA: Diagnosis not present

## 2017-01-13 DIAGNOSIS — M5136 Other intervertebral disc degeneration, lumbar region: Secondary | ICD-10-CM | POA: Diagnosis not present

## 2017-01-13 DIAGNOSIS — M7138 Other bursal cyst, other site: Secondary | ICD-10-CM | POA: Diagnosis not present

## 2017-01-13 DIAGNOSIS — M4726 Other spondylosis with radiculopathy, lumbar region: Secondary | ICD-10-CM | POA: Diagnosis not present

## 2017-01-20 DIAGNOSIS — Z23 Encounter for immunization: Secondary | ICD-10-CM | POA: Diagnosis not present

## 2017-03-09 DIAGNOSIS — C44311 Basal cell carcinoma of skin of nose: Secondary | ICD-10-CM | POA: Diagnosis not present

## 2017-04-26 DIAGNOSIS — D0461 Carcinoma in situ of skin of right upper limb, including shoulder: Secondary | ICD-10-CM | POA: Diagnosis not present

## 2017-04-26 DIAGNOSIS — L814 Other melanin hyperpigmentation: Secondary | ICD-10-CM | POA: Diagnosis not present

## 2017-04-26 DIAGNOSIS — L821 Other seborrheic keratosis: Secondary | ICD-10-CM | POA: Diagnosis not present

## 2017-04-26 DIAGNOSIS — Z85828 Personal history of other malignant neoplasm of skin: Secondary | ICD-10-CM | POA: Diagnosis not present

## 2017-05-08 DIAGNOSIS — D0461 Carcinoma in situ of skin of right upper limb, including shoulder: Secondary | ICD-10-CM | POA: Diagnosis not present

## 2017-05-25 ENCOUNTER — Other Ambulatory Visit: Payer: Self-pay | Admitting: Internal Medicine

## 2017-08-17 ENCOUNTER — Encounter: Payer: Self-pay | Admitting: Internal Medicine

## 2017-08-18 ENCOUNTER — Other Ambulatory Visit: Payer: Self-pay | Admitting: Internal Medicine

## 2017-09-07 ENCOUNTER — Telehealth: Payer: Self-pay | Admitting: Internal Medicine

## 2017-09-07 MED ORDER — ZOLPIDEM TARTRATE 10 MG PO TABS: 10.0000 mg | ORAL_TABLET | Freq: Every day | ORAL | 0 refills | Status: DC

## 2017-09-07 NOTE — Telephone Encounter (Signed)
Medication was refilled.

## 2017-09-07 NOTE — Telephone Encounter (Signed)
Pt request refill  zolpidem (AMBIEN) 10 MG tablet  Pt has made CPE for 10/16, but hopes to get refill to get him through until then.  [id number] [place] - [address]

## 2017-09-12 ENCOUNTER — Ambulatory Visit (INDEPENDENT_AMBULATORY_CARE_PROVIDER_SITE_OTHER): Payer: BLUE CROSS/BLUE SHIELD | Admitting: Internal Medicine

## 2017-09-12 ENCOUNTER — Encounter: Payer: Self-pay | Admitting: Internal Medicine

## 2017-09-12 VITALS — BP 124/68 | HR 75 | Temp 98.3°F | Ht 72.25 in | Wt 200.0 lb

## 2017-09-12 DIAGNOSIS — Z0000 Encounter for general adult medical examination without abnormal findings: Secondary | ICD-10-CM

## 2017-09-12 MED ORDER — ESCITALOPRAM OXALATE 10 MG PO TABS: 10.0000 mg | ORAL_TABLET | Freq: Every day | ORAL | 3 refills | Status: DC

## 2017-09-12 MED ORDER — ZOLPIDEM TARTRATE 10 MG PO TABS: 10.0000 mg | ORAL_TABLET | Freq: Every day | ORAL | 0 refills | Status: DC

## 2017-09-12 NOTE — Patient Instructions (Addendum)
Limit your sodium (Salt) intake    It is important that you exercise regularly, at least 20 minutes 3 to 4 times per week.  If you develop chest pain or shortness of breath seek  medical attention.  Return in one year for follow-up  Return at your  convenience for a early morning lab draw after an overnight fast   Health Maintenance, Male A healthy lifestyle and preventive care is important for your health and wellness. Ask your health care provider about what schedule of regular examinations is right for you. What should I know about weight and diet? Eat a Healthy Diet  Eat plenty of vegetables, fruits, whole grains, low-fat dairy products, and lean protein.  Do not eat a lot of foods high in solid fats, added sugars, or salt.  Maintain a Healthy Weight Regular exercise can help you achieve or maintain a healthy weight. You should:  Do at least 150 minutes of exercise each week. The exercise should increase your heart rate and make you sweat (moderate-intensity exercise).  Do strength-training exercises at least twice a week.  Watch Your Levels of Cholesterol and Blood Lipids  Have your blood tested for lipids and cholesterol every 5 years starting at 60 years of age. If you are at high risk for heart disease, you should start having your blood tested when you are 60 years old. You may need to have your cholesterol levels checked more often if: ? Your lipid or cholesterol levels are high. ? You are older than 60 years of age. ? You are at high risk for heart disease.  What should I know about cancer screening? Many types of cancers can be detected early and may often be prevented. Lung Cancer  You should be screened every year for lung cancer if: ? You are a current smoker who has smoked for at least 30 years. ? You are a former smoker who has quit within the past 15 years.  Talk to your health care provider about your screening options, when you should start screening, and  how often you should be screened.  Colorectal Cancer  Routine colorectal cancer screening usually begins at 60 years of age and should be repeated every 5-10 years until you are 60 years old. You may need to be screened more often if early forms of precancerous polyps or small growths are found. Your health care provider may recommend screening at an earlier age if you have risk factors for colon cancer.  Your health care provider may recommend using home test kits to check for hidden blood in the stool.  A small camera at the end of a tube can be used to examine your colon (sigmoidoscopy or colonoscopy). This checks for the earliest forms of colorectal cancer.  Prostate and Testicular Cancer  Depending on your age and overall health, your health care provider may do certain tests to screen for prostate and testicular cancer.  Talk to your health care provider about any symptoms or concerns you have about testicular or prostate cancer.  Skin Cancer  Check your skin from head to toe regularly.  Tell your health care provider about any new moles or changes in moles, especially if: ? There is a change in a mole's size, shape, or color. ? You have a mole that is larger than a pencil eraser.  Always use sunscreen. Apply sunscreen liberally and repeat throughout the day.  Protect yourself by wearing long sleeves, pants, a wide-brimmed hat, and sunglasses when outside.  What should I know about heart disease, diabetes, and high blood pressure?  If you are 60-52 years of age, have your blood pressure checked every 3-5 years. If you are 38 years of age or older, have your blood pressure checked every year. You should have your blood pressure measured twice-once when you are at a hospital or clinic, and once when you are not at a hospital or clinic. Record the average of the two measurements. To check your blood pressure when you are not at a hospital or clinic, you can use: ? An automated blood  pressure machine at a pharmacy. ? A home blood pressure monitor.  Talk to your health care provider about your target blood pressure.  If you are between 61-59 years old, ask your health care provider if you should take aspirin to prevent heart disease.  Have regular diabetes screenings by checking your fasting blood sugar level. ? If you are at a normal weight and have a low risk for diabetes, have this test once every three years after the age of 24. ? If you are overweight and have a high risk for diabetes, consider being tested at a younger age or more often.  A one-time screening for abdominal aortic aneurysm (AAA) by ultrasound is recommended for men aged 65-75 years who are current or former smokers. What should I know about preventing infection? Hepatitis B If you have a higher risk for hepatitis B, you should be screened for this virus. Talk with your health care provider to find out if you are at risk for hepatitis B infection. Hepatitis C Blood testing is recommended for:  Everyone born from [birth year] through 1964.  Anyone with known risk factors for hepatitis C.  Sexually Transmitted Diseases (STDs)  You should be screened each year for STDs including gonorrhea and chlamydia if: ? You are sexually active and are younger than 60 years of age. ? You are older than 60 years of age and your health care provider tells you that you are at risk for this type of infection. ? Your sexual activity has changed since you were last screened and you are at an increased risk for chlamydia or gonorrhea. Ask your health care provider if you are at risk.  Talk with your health care provider about whether you are at high risk of being infected with HIV. Your health care provider may recommend a prescription medicine to help prevent HIV infection.  What else can I do?  Schedule regular health, dental, and eye exams.  Stay current with your vaccines (immunizations).  Do not use any tobacco  products, such as cigarettes, chewing tobacco, and e-cigarettes. If you need help quitting, ask your health care provider.  Limit alcohol intake to no more than 2 drinks per day. One drink equals 12 ounces of beer, 5 ounces of wine, or 1 ounces of hard liquor.  Do not use street drugs.  Do not share needles.  Ask your health care provider for help if you need support or information about quitting drugs.  Tell your health care provider if you often feel depressed.  Tell your health care provider if you have ever been abused or do not feel safe at home. This information is not intended to replace advice given to you by your health care provider. Make sure you discuss any questions you have with your health care provider. Document Released: 05/12/2008 Document Revised: 07/13/2016 Document Reviewed: 08/18/2015 Elsevier Interactive Patient Education  [employer].

## 2017-09-12 NOTE — Progress Notes (Signed)
   Subjective:    Patient ID: Sean [name], male    DOB: 12/07/1955, 61 y.o.   MRN: [id number]  HPI  61 year old patient who is seen today for a annual preventive care.  Examination.  He enjoys excellent health with a history of colonic polyps and family history of colon cancer.  He has had a number of colonoscopies since age 37 and has recently received a recall letter for follow-up. He is doing quite well He adheres to a heart healthy diet and also has a rigorous exercise regimen.  He bikes for 90 minutes 3 times per week.  Avid skier and scuba diver  Family history unchanged.  Mother died of complications of colon cancer.  Father died at age 78 of an MI.  One brother died of complications of testicular cancer in his forties.  One brother is well.  One sister is well  Social history.  Lawyer.  Review of Systems  Constitutional: Negative for appetite change, chills, fatigue and fever.  HENT: Negative for congestion, dental problem, ear pain, hearing loss, sore throat, tinnitus, trouble swallowing and voice change.   Eyes: Negative for pain, discharge and visual disturbance.  Respiratory: Negative for cough, chest tightness, wheezing and stridor.   Cardiovascular: Negative for chest pain, palpitations and leg swelling.  Gastrointestinal: Negative for abdominal distention, abdominal pain, blood in stool, constipation, diarrhea, nausea and vomiting.  Genitourinary: Negative for difficulty urinating, discharge, flank pain, genital sores, hematuria and urgency.  Musculoskeletal: Negative for arthralgias, back pain, gait problem, joint swelling, myalgias and neck stiffness.  Skin: Negative for rash.  Neurological: Negative for dizziness, syncope, speech difficulty, weakness, numbness and headaches.  Hematological: Negative for adenopathy. Does not bruise/bleed easily.  Psychiatric/Behavioral: Negative for behavioral problems and dysphoric mood. The patient is nervous/anxious.          Objective:   Physical Exam  Constitutional: He appears well-developed and well-nourished.  HENT:  Head: Normocephalic and atraumatic.  Right Ear: External ear normal.  Left Ear: External ear normal.  Nose: Nose normal.  Mouth/Throat: Oropharynx is clear and moist.  Eyes: Pupils are equal, round, and reactive to light. Conjunctivae and EOM are normal. No scleral icterus.  Neck: Normal range of motion. Neck supple. No JVD present. No thyromegaly present.  Cardiovascular: Regular rhythm, normal heart sounds and intact distal pulses.  Exam reveals no gallop and no friction rub.   No murmur heard. Pulmonary/Chest: Effort normal and breath sounds normal. He exhibits no tenderness.  Abdominal: Soft. Bowel sounds are normal. He exhibits no distension and no mass. There is no tenderness.  Genitourinary: Prostate normal and penis normal. Rectal exam shows guaiac negative stool.  Musculoskeletal: Normal range of motion. He exhibits no edema or tenderness.  Lymphadenopathy:    He has no cervical adenopathy.  Neurological: He is alert. He has normal reflexes. No cranial nerve deficit. Coordination normal.  Skin: Skin is warm and dry. No rash noted.  Psychiatric: He has a normal mood and affect. His behavior is normal.          Assessment & Plan:   Preventive health examination Episodic insomnia.  Ambien refilled  Will review updated lab.  Patient is nonfasting today and will return at his convenience after an overnight fast  Follow-up one year or as needed Colonoscopy is scheduled  Rogelia Boga

## 2017-09-14 ENCOUNTER — Other Ambulatory Visit: Payer: Self-pay | Admitting: Internal Medicine

## 2017-09-14 DIAGNOSIS — E785 Hyperlipidemia, unspecified: Secondary | ICD-10-CM

## 2017-09-21 ENCOUNTER — Other Ambulatory Visit (INDEPENDENT_AMBULATORY_CARE_PROVIDER_SITE_OTHER): Payer: BLUE CROSS/BLUE SHIELD

## 2017-09-21 ENCOUNTER — Ambulatory Visit (INDEPENDENT_AMBULATORY_CARE_PROVIDER_SITE_OTHER): Payer: BLUE CROSS/BLUE SHIELD

## 2017-09-21 DIAGNOSIS — E785 Hyperlipidemia, unspecified: Secondary | ICD-10-CM | POA: Diagnosis not present

## 2017-09-21 DIAGNOSIS — Z23 Encounter for immunization: Secondary | ICD-10-CM | POA: Diagnosis not present

## 2017-09-21 LAB — LIPID PANEL
CHOLESTEROL: 157 mg/dL (ref 0–200)
HDL: 56.4 mg/dL (ref >39.00)
LDL Cholesterol: 62 mg/dL (ref 0–99)
NonHDL: 100.92
TRIGLYCERIDES: 194 mg/dL — AB (ref 0.0–149.0)
Total CHOL/HDL Ratio: 3
VLDL: 38.8 mg/dL (ref 0.0–40.0)

## 2017-09-21 LAB — HEPATIC FUNCTION PANEL
ALBUMIN: 4.2 g/dL (ref 3.5–5.2)
ALT: 19 U/L (ref 0–53)
AST: 20 U/L (ref 0–37)
Alkaline Phosphatase: 43 U/L (ref 39–117)
Bilirubin, Direct: 0.1 mg/dL (ref 0.0–0.3)
TOTAL PROTEIN: 6.3 g/dL (ref 6.0–8.3)
Total Bilirubin: 0.5 mg/dL (ref 0.2–1.2)

## 2017-09-21 LAB — BASIC METABOLIC PANEL
BUN: 15 mg/dL (ref 6–23)
CO2: 27 meq/L (ref 19–32)
Calcium: 9.2 mg/dL (ref 8.4–10.5)
Chloride: 107 mEq/L (ref 96–112)
Creatinine, Ser: 0.93 mg/dL (ref 0.40–1.50)
GFR: 87.93 mL/min (ref >60.00)
GLUCOSE: 95 mg/dL (ref 70–99)
POTASSIUM: 4 meq/L (ref 3.5–5.1)
Sodium: 142 mEq/L (ref 135–145)

## 2017-09-21 LAB — CBC
HEMATOCRIT: 45.3 % (ref 39.0–52.0)
Hemoglobin: 15.4 g/dL (ref 13.0–17.0)
MCHC: 34.1 g/dL (ref 30.0–36.0)
MCV: 99.5 fl (ref 78.0–100.0)
PLATELETS: 226 10*3/uL (ref 150.0–400.0)
RBC: 4.55 Mil/uL (ref 4.22–5.81)
RDW: 12.6 % (ref 11.5–15.5)
WBC: 4.2 10*3/uL (ref 4.0–10.5)

## 2017-09-21 LAB — PSA: PSA: 3.15 ng/mL (ref 0.10–4.00)

## 2017-09-21 LAB — TSH: TSH: 1.89 u[IU]/mL (ref 0.35–4.50)

## 2017-09-26 ENCOUNTER — Encounter: Payer: Self-pay | Admitting: Internal Medicine

## 2017-09-29 DIAGNOSIS — D3131 Benign neoplasm of right choroid: Secondary | ICD-10-CM | POA: Diagnosis not present

## 2017-11-30 ENCOUNTER — Telehealth: Payer: Self-pay | Admitting: Internal Medicine

## 2017-11-30 ENCOUNTER — Encounter: Payer: Self-pay | Admitting: Internal Medicine

## 2017-11-30 NOTE — Telephone Encounter (Signed)
Copied from CRM [phone]. Topic: Quick Communication - See Telephone Encounter >> Nov 30, 2017 10:36 AM Waymon AmatoBurton, Donna F wrote: CRM for notification. See Telephone encounter for:  Pt is leaving Saturday early for a diving trip and needs a letter from his PCP that it is ok to dive -he just had his physical on 09/12/17  please call pt at [phone]   11/30/17.

## 2017-11-30 NOTE — Telephone Encounter (Signed)
Sir please advise.  

## 2017-11-30 NOTE — Telephone Encounter (Signed)
Letter dictated and available for pickup 

## 2017-12-01 NOTE — Telephone Encounter (Signed)
Pt stopped by office and picked up letter this morning.

## 2018-03-01 DIAGNOSIS — Z800 Family history of malignant neoplasm of digestive organs: Secondary | ICD-10-CM | POA: Diagnosis not present

## 2018-03-01 DIAGNOSIS — Z1211 Encounter for screening for malignant neoplasm of colon: Secondary | ICD-10-CM | POA: Diagnosis not present

## 2018-03-01 DIAGNOSIS — Z86010 Personal history of colonic polyps: Secondary | ICD-10-CM | POA: Diagnosis not present

## 2018-03-14 ENCOUNTER — Other Ambulatory Visit: Payer: Self-pay | Admitting: Internal Medicine

## 2018-04-19 ENCOUNTER — Telehealth: Payer: Self-pay | Admitting: *Deleted

## 2018-04-19 NOTE — Telephone Encounter (Signed)
Copied from CRM [id number]. Topic: General - Other >> Apr 19, 2018  8:58 AM Elliot Gault wrote: Patient called back stating his wife is a patient of Dr. Caryl Never and would like to transfer to him, please advise

## 2018-04-19 NOTE — Telephone Encounter (Signed)
Dr. Caryl Never, can you accept patient?

## 2018-04-19 NOTE — Telephone Encounter (Signed)
yes

## 2018-04-20 NOTE — Telephone Encounter (Signed)
Please contact patient to schedule

## 2018-05-01 DIAGNOSIS — D2262 Melanocytic nevi of left upper limb, including shoulder: Secondary | ICD-10-CM | POA: Diagnosis not present

## 2018-05-01 DIAGNOSIS — D2362 Other benign neoplasm of skin of left upper limb, including shoulder: Secondary | ICD-10-CM | POA: Diagnosis not present

## 2018-05-01 DIAGNOSIS — Z85828 Personal history of other malignant neoplasm of skin: Secondary | ICD-10-CM | POA: Diagnosis not present

## 2018-05-01 DIAGNOSIS — L821 Other seborrheic keratosis: Secondary | ICD-10-CM | POA: Diagnosis not present

## 2018-05-07 DIAGNOSIS — Z01818 Encounter for other preprocedural examination: Secondary | ICD-10-CM | POA: Diagnosis not present

## 2018-05-07 DIAGNOSIS — Z1211 Encounter for screening for malignant neoplasm of colon: Secondary | ICD-10-CM | POA: Diagnosis not present

## 2018-05-07 DIAGNOSIS — K635 Polyp of colon: Secondary | ICD-10-CM | POA: Diagnosis not present

## 2018-05-31 DIAGNOSIS — K635 Polyp of colon: Secondary | ICD-10-CM | POA: Diagnosis not present

## 2018-06-25 ENCOUNTER — Other Ambulatory Visit: Payer: Self-pay | Admitting: Internal Medicine

## 2018-06-25 NOTE — Telephone Encounter (Signed)
Patient need to schedule an ov for more refills. 

## 2018-06-26 NOTE — Telephone Encounter (Signed)
Patient need to schedule an ov for more refills. 

## 2018-06-28 NOTE — Telephone Encounter (Signed)
appt scheduled for more refills!

## 2018-07-04 ENCOUNTER — Ambulatory Visit: Payer: BLUE CROSS/BLUE SHIELD | Admitting: Internal Medicine

## 2018-07-04 ENCOUNTER — Encounter: Payer: Self-pay | Admitting: Internal Medicine

## 2018-07-04 VITALS — BP 110/64 | HR 77 | Temp 98.6°F | Wt 199.6 lb

## 2018-07-04 DIAGNOSIS — G4700 Insomnia, unspecified: Secondary | ICD-10-CM | POA: Diagnosis not present

## 2018-07-04 DIAGNOSIS — J301 Allergic rhinitis due to pollen: Secondary | ICD-10-CM

## 2018-07-04 DIAGNOSIS — Z86010 Personal history of colonic polyps: Secondary | ICD-10-CM

## 2018-07-04 MED ORDER — ESCITALOPRAM OXALATE 10 MG PO TABS: 10.0000 mg | ORAL_TABLET | Freq: Every day | ORAL | 3 refills | Status: DC

## 2018-07-04 MED ORDER — ZOLPIDEM TARTRATE 10 MG PO TABS: 10.0000 mg | ORAL_TABLET | Freq: Every day | ORAL | 0 refills | Status: DC

## 2018-07-04 NOTE — Patient Instructions (Signed)
Please return in the fall for annual exam with Dr. Caryl NeverBurchette

## 2018-07-04 NOTE — Progress Notes (Signed)
Subjective:    Patient ID: Sean [name], male    DOB: 1956/08/20, 62 y.o.   MRN: [id number]  HPI 62 year old patient who is seen today for follow-up.  He has a history of colonic polyps and within the last year has had follow-up colonoscopy.  He has had colonoscopies at least 5-year interval since age 48 due to a personal history of colonic polyps as well as a family history of colonic cancer. Doing quite well.  Does use Ambien sporadically due to episodic insomnia.  He has been prescribed Lexapro which he only takes sporadically.  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 No pertinent past medical history.   Social History   Socioeconomic History  . Marital status: Married    Spouse name: Not on file  . Number of children: Not on file  . Years of education: Not on file  . Highest education level: Not on file  Occupational History  . Not on file  Social Needs  . Financial resource strain: Not on file  . Food insecurity:    Worry: Not on file    Inability: Not on file  . Transportation needs:    Medical: Not on file    Non-medical: Not on file  Tobacco Use  . Smoking status: Current Some Day Smoker    Types: Cigars  . Smokeless tobacco: Never Used  Substance and Sexual Activity  . Alcohol use: Yes    Alcohol/week: 2.4 - 3.0 oz    Types: 4 - 5 Cans of beer per week  . Drug use: No  . Sexual activity: Not on file  Lifestyle  . Physical activity:    Days per week: Not on file    Minutes per session: Not on file  . Stress: Not on file  Relationships  . Social connections:    Talks on phone: Not on file    Gets together: Not on file    Attends religious service: Not on file    Active member of club or organization: Not on file    Attends meetings of clubs or organizations: Not on file    Relationship status: Not on file  . Intimate partner violence:    Fear of current or ex partner: Not on file   Emotionally abused: Not on file    Physically abused: Not on file    Forced sexual activity: Not on file  Other Topics Concern  . Not on file  Social History Narrative  . Not on file    History reviewed. No pertinent surgical history.  History reviewed. No pertinent family history.  No Known Allergies  Current Outpatient Medications on File Prior to Visit  Medication Sig Dispense Refill  . aspirin 81 MG tablet Take 81 mg by mouth daily.    Marland Kitchen escitalopram (LEXAPRO) 10 MG tablet Take 1 tablet (10 mg total) by mouth daily. 90 tablet 3  . Multiple Vitamins-Minerals (CENTRUM SILVER ADULT 50+ PO) Take by mouth.    . OMEGA-3 KRILL OIL PO Take 1 capsule by mouth daily.    Marland Kitchen zolpidem (AMBIEN) 10 MG tablet TAKE 1 TABLET AT BEDTIME 30 tablet 0   No current facility-administered medications on file prior to visit.     BP 110/64 (BP Location: Right Arm, Patient Position: Sitting, Cuff Size: Large)   Pulse 77   Temp 98.6 F (37 C) (Oral)   Wt 199 lb 9.6 oz (90.5 kg)   SpO2  97%   BMI 26.88 kg/m      Review of Systems  Constitutional: Negative for appetite change, chills, fatigue and fever.  HENT: Negative for congestion, dental problem, ear pain, hearing loss, sore throat, tinnitus, trouble swallowing and voice change.   Eyes: Negative for pain, discharge and visual disturbance.  Respiratory: Negative for cough, chest tightness, wheezing and stridor.   Cardiovascular: Negative for chest pain, palpitations and leg swelling.  Gastrointestinal: Negative for abdominal distention, abdominal pain, blood in stool, constipation, diarrhea, nausea and vomiting.  Genitourinary: Negative for difficulty urinating, discharge, flank pain, genital sores, hematuria and urgency.  Musculoskeletal: Negative for arthralgias, back pain, gait problem, joint swelling, myalgias and neck stiffness.  Skin: Negative for rash.  Neurological: Negative for dizziness, syncope, speech difficulty, weakness, numbness  and headaches.  Hematological: Negative for adenopathy. Does not bruise/bleed easily.  Psychiatric/Behavioral: Positive for sleep disturbance. Negative for behavioral problems and dysphoric mood. The patient is not nervous/anxious.        Objective:   Physical Exam  Constitutional: He is oriented to person, place, and time. He appears well-developed.  HENT:  Head: Normocephalic.  Right Ear: External ear normal.  Left Ear: External ear normal.  Eyes: Conjunctivae and EOM are normal.  Neck: Normal range of motion.  Cardiovascular: Normal rate and normal heart sounds.  Pulmonary/Chest: Breath sounds normal.  Abdominal: Bowel sounds are normal.  Musculoskeletal: Normal range of motion. He exhibits no edema or tenderness.  Neurological: He is alert and oriented to person, place, and time.  Psychiatric: He has a normal mood and affect. His behavior is normal.          Assessment & Plan:  Episodic insomnia  Allergic rhinitis  history of colonic polyps  Patient to establish with a new PCP in October Medications updated  Gordy SaversPeter F Jacque Byron

## 2018-08-31 ENCOUNTER — Ambulatory Visit: Payer: BLUE CROSS/BLUE SHIELD | Admitting: Family Medicine

## 2018-08-31 ENCOUNTER — Encounter: Payer: Self-pay | Admitting: Family Medicine

## 2018-08-31 ENCOUNTER — Other Ambulatory Visit: Payer: Self-pay

## 2018-08-31 ENCOUNTER — Telehealth: Payer: Self-pay | Admitting: Family Medicine

## 2018-08-31 VITALS — BP 136/82 | HR 71 | Temp 98.5°F | Ht 72.25 in | Wt 197.0 lb

## 2018-08-31 DIAGNOSIS — Z0000 Encounter for general adult medical examination without abnormal findings: Secondary | ICD-10-CM

## 2018-08-31 DIAGNOSIS — Z23 Encounter for immunization: Secondary | ICD-10-CM | POA: Diagnosis not present

## 2018-08-31 LAB — HEPATIC FUNCTION PANEL
ALBUMIN: 4.6 g/dL (ref 3.5–5.2)
ALT: 22 U/L (ref 0–53)
AST: 25 U/L (ref 0–37)
Alkaline Phosphatase: 47 U/L (ref 39–117)
Bilirubin, Direct: 0.1 mg/dL (ref 0.0–0.3)
TOTAL PROTEIN: 6.9 g/dL (ref 6.0–8.3)
Total Bilirubin: 0.6 mg/dL (ref 0.2–1.2)

## 2018-08-31 LAB — CBC WITH DIFFERENTIAL/PLATELET
Basophils Absolute: 0.1 10*3/uL (ref 0.0–0.1)
Basophils Relative: 1.2 % (ref 0.0–3.0)
EOS PCT: 2.4 % (ref 0.0–5.0)
Eosinophils Absolute: 0.1 10*3/uL (ref 0.0–0.7)
HCT: 44.2 % (ref 39.0–52.0)
Hemoglobin: 15.2 g/dL (ref 13.0–17.0)
Lymphocytes Relative: 37.5 % (ref 12.0–46.0)
Lymphs Abs: 1.6 10*3/uL (ref 0.7–4.0)
MCHC: 34.4 g/dL (ref 30.0–36.0)
MCV: 98.1 fl (ref 78.0–100.0)
MONO ABS: 0.5 10*3/uL (ref 0.1–1.0)
Monocytes Relative: 11.5 % (ref 3.0–12.0)
Neutro Abs: 2 10*3/uL (ref 1.4–7.7)
Neutrophils Relative %: 47.4 % (ref 43.0–77.0)
Platelets: 210 10*3/uL (ref 150.0–400.0)
RBC: 4.51 Mil/uL (ref 4.22–5.81)
RDW: 12.9 % (ref 11.5–15.5)
WBC: 4.3 10*3/uL (ref 4.0–10.5)

## 2018-08-31 LAB — BASIC METABOLIC PANEL
BUN: 15 mg/dL (ref 6–23)
CALCIUM: 9.4 mg/dL (ref 8.4–10.5)
CO2: 27 mEq/L (ref 19–32)
Chloride: 107 mEq/L (ref 96–112)
Creatinine, Ser: 1.02 mg/dL (ref 0.40–1.50)
GFR: 78.79 mL/min (ref >60.00)
GLUCOSE: 94 mg/dL (ref 70–99)
POTASSIUM: 4.2 meq/L (ref 3.5–5.1)
Sodium: 141 mEq/L (ref 135–145)

## 2018-08-31 LAB — LIPID PANEL
Cholesterol: 178 mg/dL (ref 0–200)
HDL: 56.8 mg/dL (ref >39.00)
LDL Cholesterol: 88 mg/dL (ref 0–99)
NonHDL: 121.38
Total CHOL/HDL Ratio: 3
Triglycerides: 167 mg/dL — ABNORMAL HIGH (ref 0.0–149.0)
VLDL: 33.4 mg/dL (ref 0.0–40.0)

## 2018-08-31 LAB — PSA: PSA: 3.14 ng/mL (ref 0.10–4.00)

## 2018-08-31 LAB — TSH: TSH: 1.99 u[IU]/mL (ref 0.35–4.50)

## 2018-08-31 MED ORDER — ZOLPIDEM TARTRATE 10 MG PO TABS: 10.0000 mg | ORAL_TABLET | Freq: Every day | ORAL | 0 refills | Status: DC

## 2018-08-31 NOTE — Telephone Encounter (Signed)
Pt stopped by the front before leaving and he was wanting to make sure that his Rx Ambien is refilled   [facility] [employer].

## 2018-08-31 NOTE — Telephone Encounter (Signed)
done

## 2018-08-31 NOTE — Progress Notes (Signed)
  Subjective:     Patient ID: Sean [name], male   DOB: [birth year], 62 y.o.   MRN: [id number]  HPI Patient is here to establish care with me today and also for well visit.  Generally healthy.  He has history of mild hypertriglyceridemia.  Intermittent insomnia.  Infrequently takes Ambien.  History of skin cancers with basal cell carcinoma and squamous cell carcinoma and he gets regular follow-up with dermatology.  Non-smoker.  Works as a Environmental manager.  Enjoys scuba diving.  Needs flu vaccine.  Family history significant for mother with colon cancer.  Patient had repeat colonoscopy just recently within the past year  History reviewed. No pertinent past medical history. History reviewed. No pertinent surgical history.  reports that he has been smoking cigars. He has never used smokeless tobacco. He reports that he drinks about 4.0 - 5.0 standard drinks of alcohol per week. He reports that he does not use drugs. family history is not on file. No Known Allergies     Review of Systems  Constitutional: Negative for activity change, appetite change, fatigue and fever.  HENT: Negative for congestion, ear pain and trouble swallowing.   Eyes: Negative for pain and visual disturbance.  Respiratory: Negative for cough, shortness of breath and wheezing.   Cardiovascular: Negative for chest pain and palpitations.  Gastrointestinal: Negative for abdominal distention, abdominal pain, blood in stool, constipation, diarrhea, nausea, rectal pain and vomiting.  Genitourinary: Negative for dysuria, hematuria and testicular pain.  Musculoskeletal: Negative for arthralgias and joint swelling.  Skin: Negative for rash.  Neurological: Negative for dizziness, syncope and headaches.  Hematological: Negative for adenopathy.  Psychiatric/Behavioral: Negative for confusion and dysphoric mood.       Objective:   Physical Exam  Constitutional: He is oriented to person, place, and  time. He appears well-developed and well-nourished.  HENT:  Mouth/Throat: Oropharynx is clear and moist.  Neck: Neck supple.  Cardiovascular: Normal rate and regular rhythm. Exam reveals no gallop.  Pulmonary/Chest: Effort normal and breath sounds normal.  Abdominal: Soft. He exhibits no mass. There is no tenderness. There is no rebound and no guarding.  Musculoskeletal: He exhibits no edema.  Lymphadenopathy:    He has no cervical adenopathy.  Neurological: He is alert and oriented to person, place, and time.  Psychiatric: He has a normal mood and affect. His behavior is normal.       Assessment:     Physical exam.  The following health maintenance issues were addressed    Plan:     -Flu vaccine given -Check hepatitis C antibody -Obtain screening labs -Recommend follow-up PSA.  PSA last year was 3 and this had increased from 1.89 the year before.  Kristian Covey MD [facility] at [facility]

## 2018-08-31 NOTE — Telephone Encounter (Signed)
Okay to refill? 

## 2018-09-01 LAB — HEPATITIS C ANTIBODY
Hepatitis C Ab: NONREACTIVE
SIGNAL TO CUT-OFF: 0.02 (ref <1.00)

## 2018-09-02 ENCOUNTER — Encounter: Payer: Self-pay | Admitting: Family Medicine

## 2018-09-14 ENCOUNTER — Encounter: Payer: BLUE CROSS/BLUE SHIELD | Admitting: Family Medicine

## 2018-09-19 ENCOUNTER — Encounter: Payer: BLUE CROSS/BLUE SHIELD | Admitting: Family Medicine

## 2018-10-30 ENCOUNTER — Encounter: Payer: Self-pay | Admitting: Family Medicine

## 2018-10-30 ENCOUNTER — Ambulatory Visit: Payer: BLUE CROSS/BLUE SHIELD | Admitting: Family Medicine

## 2018-10-30 VITALS — BP 144/82 | HR 68 | Temp 98.5°F | Wt 201.2 lb

## 2018-10-30 DIAGNOSIS — L309 Dermatitis, unspecified: Secondary | ICD-10-CM

## 2018-10-30 NOTE — Progress Notes (Signed)
   Subjective:    Patient ID: Sean [name], male    DOB: 1956/01/26, 62 y.o.   MRN: [id number]  HPI Here for several days of itching rashes on the abdomen and chest and back.    Review of Systems  Constitutional: Negative.   Respiratory: Negative.   Cardiovascular: Negative.   Skin: Positive for rash.       Objective:   Physical Exam  Constitutional: He appears well-developed and well-nourished.  Cardiovascular: Normal rate, regular rhythm, normal heart sounds and intact distal pulses.  Pulmonary/Chest: Effort normal and breath sounds normal.  Skin:  Fine red maculopapular rash over the trunk, many areas are excoriated           Assessment & Plan:  Eczema. Advised him to use a moisturizing soap like Dove and a moisturizing lotion like Eucerin.  Gershon CraneStephen Wilbern Pennypacker, MD

## 2018-12-07 ENCOUNTER — Other Ambulatory Visit: Payer: Self-pay | Admitting: Family Medicine

## 2018-12-07 NOTE — Telephone Encounter (Signed)
Last OV 10/30/18, No future OV  Last filled 08/31/18, # 30 with 0 refills

## 2018-12-09 NOTE — Telephone Encounter (Signed)
Refill once.  Avoid regular use. 

## 2019-03-07 ENCOUNTER — Other Ambulatory Visit: Payer: Self-pay | Admitting: Family Medicine

## 2019-03-07 NOTE — Telephone Encounter (Signed)
Last OV 10/30/18, No future OV  Last filled 12/10/18, # 30 with 0 refills

## 2019-05-07 DIAGNOSIS — C44719 Basal cell carcinoma of skin of left lower limb, including hip: Secondary | ICD-10-CM | POA: Diagnosis not present

## 2019-05-07 DIAGNOSIS — Z85828 Personal history of other malignant neoplasm of skin: Secondary | ICD-10-CM | POA: Diagnosis not present

## 2019-05-07 DIAGNOSIS — B353 Tinea pedis: Secondary | ICD-10-CM | POA: Diagnosis not present

## 2019-05-07 DIAGNOSIS — L570 Actinic keratosis: Secondary | ICD-10-CM | POA: Diagnosis not present

## 2019-05-07 DIAGNOSIS — L821 Other seborrheic keratosis: Secondary | ICD-10-CM | POA: Diagnosis not present

## 2019-05-07 DIAGNOSIS — D2362 Other benign neoplasm of skin of left upper limb, including shoulder: Secondary | ICD-10-CM | POA: Diagnosis not present

## 2019-06-04 DIAGNOSIS — C44719 Basal cell carcinoma of skin of left lower limb, including hip: Secondary | ICD-10-CM | POA: Diagnosis not present

## 2019-06-19 ENCOUNTER — Other Ambulatory Visit: Payer: Self-pay | Admitting: Family Medicine

## 2019-06-19 NOTE — Telephone Encounter (Signed)
Last OV 10/30/18, No future OV  Last filled 03/10/19, # 30 with 0 refills

## 2019-06-25 DIAGNOSIS — H2511 Age-related nuclear cataract, right eye: Secondary | ICD-10-CM | POA: Diagnosis not present

## 2019-07-12 DIAGNOSIS — Z03818 Encounter for observation for suspected exposure to other biological agents ruled out: Secondary | ICD-10-CM | POA: Diagnosis not present

## 2019-07-18 DIAGNOSIS — D3131 Benign neoplasm of right choroid: Secondary | ICD-10-CM | POA: Diagnosis not present

## 2019-07-18 DIAGNOSIS — H25013 Cortical age-related cataract, bilateral: Secondary | ICD-10-CM | POA: Diagnosis not present

## 2019-07-18 DIAGNOSIS — H2512 Age-related nuclear cataract, left eye: Secondary | ICD-10-CM | POA: Diagnosis not present

## 2019-07-18 DIAGNOSIS — H2513 Age-related nuclear cataract, bilateral: Secondary | ICD-10-CM | POA: Diagnosis not present

## 2019-07-30 ENCOUNTER — Telehealth: Payer: Self-pay | Admitting: Family Medicine

## 2019-07-30 ENCOUNTER — Encounter: Payer: Self-pay | Admitting: Family Medicine

## 2019-07-30 DIAGNOSIS — H25812 Combined forms of age-related cataract, left eye: Secondary | ICD-10-CM | POA: Diagnosis not present

## 2019-07-30 DIAGNOSIS — H52222 Regular astigmatism, left eye: Secondary | ICD-10-CM | POA: Diagnosis not present

## 2019-07-30 DIAGNOSIS — H2512 Age-related nuclear cataract, left eye: Secondary | ICD-10-CM | POA: Diagnosis not present

## 2019-07-30 HISTORY — PX: EYE SURGERY: SHX253

## 2019-07-30 NOTE — Telephone Encounter (Signed)
Patients wife called stating he needs to FU from his cataract surgery.  Patient had surgery today 9/1 and while doing surgery the monitor patients heart, patient was informed he had a irregular heart beat and needs to make an app with PCP.  Patient would like to be seen tomorrow 9/2. Patient call back [phone]

## 2019-07-30 NOTE — Telephone Encounter (Signed)
I scheduled the patient to come in the office tomorrow at 4:30 PM.

## 2019-07-30 NOTE — Telephone Encounter (Signed)
Please schedule in office visit .

## 2019-07-31 ENCOUNTER — Ambulatory Visit: Payer: BC Managed Care – PPO | Admitting: Family Medicine

## 2019-07-31 ENCOUNTER — Encounter: Payer: Self-pay | Admitting: Family Medicine

## 2019-07-31 ENCOUNTER — Other Ambulatory Visit: Payer: Self-pay

## 2019-07-31 VITALS — BP 132/76 | HR 114 | Temp 98.4°F | Ht 72.25 in | Wt 204.8 lb

## 2019-07-31 DIAGNOSIS — I4891 Unspecified atrial fibrillation: Secondary | ICD-10-CM | POA: Diagnosis not present

## 2019-07-31 NOTE — Progress Notes (Signed)
  Subjective:     Patient ID: Sean [name], male   DOB: October 30, 1956, 63 y.o.   MRN: [id number]  HPI   Patient is seen with heart irregularity which was noted yesterday when he went in for monitoring for cataract procedure.  His surgery went well.  He brings in a copy of EKG tracing from yesterday.  Does appear to probably be in A. fib.  Rate is around 90.  Patient denies any history of A. fib.  He stays very active.  He has been cycling several days per week and has not had any recent chest pain or exercise intolerance.  No recent dizziness.  No dyspnea.  Denies any fevers or chills.  No cough.  No pleuritic pain.  He did note one episode recently when he was exercising where heart rate went up to 160 which is atypical for him.  He was not sure of validity of reading.  No chronic medical problems.  Takes no regular medications.  No history of diabetes, hypertension, stroke, peripheral vascular disease.  History reviewed. No pertinent past medical history. Past Surgical History:  Procedure Laterality Date  . EYE SURGERY  07/30/2019    reports that he has been smoking cigars. He has never used smokeless tobacco. He reports current alcohol use of about 4.0 - 5.0 standard drinks of alcohol per week. He reports that he does not use drugs. family history is not on file. No Known Allergies   Review of Systems  Constitutional: Negative for chills, fatigue, fever and unexpected weight change.  Eyes: Negative for visual disturbance.  Respiratory: Negative for cough, chest tightness and shortness of breath.   Cardiovascular: Negative for chest pain, palpitations and leg swelling.  Neurological: Negative for dizziness, syncope, weakness, light-headedness and headaches.       Objective:   Physical Exam Constitutional:      Appearance: Normal appearance.  Cardiovascular:     Comments: Irregularly irregular rhythm with rate around 100 Pulmonary:     Effort: Pulmonary effort is normal.   Breath sounds: Normal breath sounds. No wheezing or rales.  Musculoskeletal:     Right lower leg: No edema.     Left lower leg: No edema.  Neurological:     Mental Status: He is alert.        Assessment:     Newly diagnosed atrial fibrillation-unknown duration.  Patient is basically asymptomatic. CHA2DS2Vasc score is 0    Plan:     -Check CBC, comprehensive metabolic panel, TSH -We discussed possible low-dose beta-blocker use but at this point he is asymptomatic and he prefers to monitor heart rate.  Be in touch if consistently greater than 110. -We discussed that anticoagulation generally not recommended for chads vas score of 0 -Talked at some length about A. fib and various potential causes and treatment approach. -Recommend cardiology referral -Follow-up immediately for any chest pain, dyspnea, dizziness, or other concerns  Eulas Post MD [facility] at [facility]

## 2019-07-31 NOTE — Patient Instructions (Addendum)
Atrial Fibrillation Atrial fibrillation is a type of irregular or rapid heartbeat (arrhythmia). In atrial fibrillation, the top part of the heart (atria) quivers in a chaotic pattern. This makes the heart unable to pump blood normally. Having atrial fibrillation can increase your risk for other health problems, such as:  Blood can pool in the atria and form clots. If a clot travels to the brain, it can cause a stroke.  The heart muscle may weaken from the irregular blood flow. This can cause heart failure. Atrial fibrillation may start suddenly and stop on its own, or it may become a long-lasting problem. What are the causes? This condition is caused by some heart-related conditions or procedures, including:  High blood pressure. This is the most common cause.  Heart failure.  Heart valve conditions.  Inflammation of the sac that surrounds the heart (pericarditis).  Heart surgery.  Coronary artery disease.  Certain heart rhythm disorders, such as Wolf-Parkinson-White syndrome. Other causes include:  Pneumonia.  Obstructive sleep apnea.  Lung cancer.  Thyroid problems, especially if the thyroid is overactive (hyperthyroidism).  Excessive alcohol or drug use. Sometimes, the cause of this condition is not known. What increases the risk? This condition is more likely to develop in:  Older people.  People who smoke.  People who have diabetes mellitus.  People who are overweight (obese).  Athletes who exercise vigorously.  People who have a family history. What are the signs or symptoms? Symptoms of this condition include:  A feeling that your heart is beating rapidly or irregularly.  A feeling of discomfort or pain in your chest.  Shortness of breath.  Sudden light-headedness or weakness.  Getting tired easily during exercise. In some cases, there are no symptoms. How is this diagnosed? Your health care provider may be able to detect atrial fibrillation when  taking your pulse. If detected, this condition may be diagnosed with:  Electrocardiogram (ECG).  Ambulatory cardiac monitor. This device records your heartbeats for 24 hours or more.  Transthoracic echocardiogram (TTE) to evaluate how blood flows through your heart.  Transesophageal echocardiogram (TEE) to view more detailed images of your heart.  A stress test.  Imaging tests, such as a CT scan or chest X-ray.  Blood tests. How is this treated? This condition may be treated with:  Medicines to slow down the heart rate or bring the heart's rhythm back to normal.  Medicines to prevent blood clots from forming.  Electrical cardioversion. This delivers a low-energy shock to the heart to reset its rhythm.  Ablation. This procedure destroys the part of the heart tissue that sends abnormal signals.  Left atrial appendage occlusion/excision. This seals off a common place in the atria where blood clots can form (left atrial appendage). The goal of treatment is to prevent blood clots from forming and to keep your heart beating at a normal rate and rhythm. Treatment depends on underlying medical conditions and how you feel when you are experiencing fibrillation. Follow these instructions at home: Medicines  Take over-the counter and prescription medicines only as told by your health care provider.  If your health care provider prescribed a blood-thinning medicine (anticoagulant), take it exactly as told. Taking too much blood-thinning medicine can cause bleeding. Taking too little can enable a blood clot to form and travel to the brain, causing a stroke. Lifestyle      Do not use any products that contain nicotine or tobacco, such as cigarettes and e-cigarettes. If you need help quitting, ask your health   care provider.  Do not drink beverages that contain caffeine, such as coffee, soda, and tea.  Follow diet instructions as told by your health care provider.  Exercise regularly as  told by your health care provider.  Do not drink alcohol. General instructions  If you have obstructive sleep apnea, manage your condition as told by your health care provider.  Maintain a healthy weight. Do not use diet pills unless your health care provider approves. Diet pills may make heart problems worse.  Keep all follow-up visits as told by your health care provider. This is important. Contact a health care provider if you:  Notice a change in the rate, rhythm, or strength of your heartbeat.  Are taking an anticoagulant and you notice increased bruising.  Tire more easily when you exercise or exert yourself.  Have a sudden change in weight. Get help right away if you have:   Chest pain, abdominal pain, sweating, or weakness.  Difficulty breathing.  Blood in your vomit, stool (feces), or urine.  Any symptoms of a stroke. "BE FAST" is an easy way to remember the main warning signs of a stroke: ? B - Balance. Signs are dizziness, sudden trouble walking, or loss of balance. ? E - Eyes. Signs are trouble seeing or a sudden change in vision. ? F - Face. Signs are sudden weakness or numbness of the face, or the face or eyelid drooping on one side. ? A - Arms. Signs are weakness or numbness in an arm. This happens suddenly and usually on one side of the body. ? S - Speech. Signs are sudden trouble speaking, slurred speech, or trouble understanding what people say. ? T - Time. Time to call emergency services. Write down what time symptoms started.  Other signs of a stroke, such as: ? A sudden, severe headache with no known cause. ? Nausea or vomiting. ? Seizure. These symptoms may represent a serious problem that is an emergency. Do not wait to see if the symptoms will go away. Get medical help right away. Call your local emergency services (911 in the [place] Do not drive yourself to the hospital. Summary  Atrial fibrillation is a type of irregular or rapid heartbeat  (arrhythmia).  Symptoms include a feeling that your heart is beating fast or irregularly. In some cases, you may not have symptoms.  The condition is treated with medicines to slow down the heart rate or bring the heart's rhythm back to normal. You may also need blood-thinning medicines to prevent blood clots.  Get help right away if you have symptoms or signs of a stroke. This information is not intended to replace advice given to you by your health care provider. Make sure you discuss any questions you have with your health care provider. Document Released: 11/14/2005 Document Revised: 01/04/2018 Document Reviewed: 01/05/2018 Elsevier Patient Education  2020 [employer].  I am setting up cardiology referral  Monitor heart rate and be in touch if consistently > 110.

## 2019-08-01 ENCOUNTER — Encounter: Payer: Self-pay | Admitting: Family Medicine

## 2019-08-01 LAB — COMPREHENSIVE METABOLIC PANEL
ALT: 42 U/L (ref 0–53)
AST: 38 U/L — ABNORMAL HIGH (ref 0–37)
Albumin: 4.1 g/dL (ref 3.5–5.2)
Alkaline Phosphatase: 59 U/L (ref 39–117)
BUN: 18 mg/dL (ref 6–23)
CO2: 27 mEq/L (ref 19–32)
Calcium: 9.2 mg/dL (ref 8.4–10.5)
Chloride: 103 mEq/L (ref 96–112)
Creatinine, Ser: 1.1 mg/dL (ref 0.40–1.50)
GFR: 67.74 mL/min (ref >60.00)
Glucose, Bld: 98 mg/dL (ref 70–99)
Potassium: 4.2 mEq/L (ref 3.5–5.1)
Sodium: 138 mEq/L (ref 135–145)
Total Bilirubin: 0.5 mg/dL (ref 0.2–1.2)
Total Protein: 6.5 g/dL (ref 6.0–8.3)

## 2019-08-01 LAB — CBC WITH DIFFERENTIAL/PLATELET
Basophils Absolute: 0.1 10*3/uL (ref 0.0–0.1)
Basophils Relative: 1.2 % (ref 0.0–3.0)
Eosinophils Absolute: 0 10*3/uL (ref 0.0–0.7)
Eosinophils Relative: 0.8 % (ref 0.0–5.0)
HCT: 41.8 % (ref 39.0–52.0)
Hemoglobin: 14.1 g/dL (ref 13.0–17.0)
Lymphocytes Relative: 33.5 % (ref 12.0–46.0)
Lymphs Abs: 1.9 10*3/uL (ref 0.7–4.0)
MCHC: 33.8 g/dL (ref 30.0–36.0)
MCV: 101.5 fl — ABNORMAL HIGH (ref 78.0–100.0)
Monocytes Absolute: 0.7 10*3/uL (ref 0.1–1.0)
Monocytes Relative: 11.7 % (ref 3.0–12.0)
Neutro Abs: 3 10*3/uL (ref 1.4–7.7)
Neutrophils Relative %: 52.8 % (ref 43.0–77.0)
Platelets: 167 10*3/uL (ref 150.0–400.0)
RBC: 4.12 Mil/uL — ABNORMAL LOW (ref 4.22–5.81)
RDW: 13 % (ref 11.5–15.5)
WBC: 5.8 10*3/uL (ref 4.0–10.5)

## 2019-08-01 LAB — TSH: TSH: 1.98 u[IU]/mL (ref 0.35–4.50)

## 2019-08-12 ENCOUNTER — Encounter: Payer: Self-pay | Admitting: Family Medicine

## 2019-08-12 DIAGNOSIS — H25011 Cortical age-related cataract, right eye: Secondary | ICD-10-CM | POA: Diagnosis not present

## 2019-08-12 DIAGNOSIS — H2511 Age-related nuclear cataract, right eye: Secondary | ICD-10-CM | POA: Diagnosis not present

## 2019-08-20 ENCOUNTER — Ambulatory Visit: Payer: BC Managed Care – PPO | Admitting: Family Medicine

## 2019-08-20 ENCOUNTER — Other Ambulatory Visit: Payer: Self-pay

## 2019-08-20 ENCOUNTER — Encounter: Payer: Self-pay | Admitting: Family Medicine

## 2019-08-20 DIAGNOSIS — I4891 Unspecified atrial fibrillation: Secondary | ICD-10-CM

## 2019-08-20 DIAGNOSIS — I480 Paroxysmal atrial fibrillation: Secondary | ICD-10-CM | POA: Insufficient documentation

## 2019-08-20 DIAGNOSIS — H52221 Regular astigmatism, right eye: Secondary | ICD-10-CM | POA: Diagnosis not present

## 2019-08-20 DIAGNOSIS — H2511 Age-related nuclear cataract, right eye: Secondary | ICD-10-CM | POA: Diagnosis not present

## 2019-08-20 DIAGNOSIS — H25011 Cortical age-related cataract, right eye: Secondary | ICD-10-CM | POA: Diagnosis not present

## 2019-08-20 DIAGNOSIS — H25811 Combined forms of age-related cataract, right eye: Secondary | ICD-10-CM | POA: Diagnosis not present

## 2019-08-20 NOTE — Patient Instructions (Signed)
Atrial Fibrillation Atrial fibrillation is a type of irregular or rapid heartbeat (arrhythmia). In atrial fibrillation, the top part of the heart (atria) quivers in a chaotic pattern. This makes the heart unable to pump blood normally. Having atrial fibrillation can increase your risk for other health problems, such as:  Blood can pool in the atria and form clots. If a clot travels to the brain, it can cause a stroke.  The heart muscle may weaken from the irregular blood flow. This can cause heart failure. Atrial fibrillation may start suddenly and stop on its own, or it may become a long-lasting problem. What are the causes? This condition is caused by some heart-related conditions or procedures, including:  High blood pressure. This is the most common cause.  Heart failure.  Heart valve conditions.  Inflammation of the sac that surrounds the heart (pericarditis).  Heart surgery.  Coronary artery disease.  Certain heart rhythm disorders, such as Wolf-Parkinson-White syndrome. Other causes include:  Pneumonia.  Obstructive sleep apnea.  Lung cancer.  Thyroid problems, especially if the thyroid is overactive (hyperthyroidism).  Excessive alcohol or drug use. Sometimes, the cause of this condition is not known. What increases the risk? This condition is more likely to develop in:  Older people.  People who smoke.  People who have diabetes mellitus.  People who are overweight (obese).  Athletes who exercise vigorously.  People who have a family history. What are the signs or symptoms? Symptoms of this condition include:  A feeling that your heart is beating rapidly or irregularly.  A feeling of discomfort or pain in your chest.  Shortness of breath.  Sudden light-headedness or weakness.  Getting tired easily during exercise. In some cases, there are no symptoms. How is this diagnosed? Your health care provider may be able to detect atrial fibrillation when  taking your pulse. If detected, this condition may be diagnosed with:  Electrocardiogram (ECG).  Ambulatory cardiac monitor. This device records your heartbeats for 24 hours or more.  Transthoracic echocardiogram (TTE) to evaluate how blood flows through your heart.  Transesophageal echocardiogram (TEE) to view more detailed images of your heart.  A stress test.  Imaging tests, such as a CT scan or chest X-ray.  Blood tests. How is this treated? This condition may be treated with:  Medicines to slow down the heart rate or bring the heart's rhythm back to normal.  Medicines to prevent blood clots from forming.  Electrical cardioversion. This delivers a low-energy shock to the heart to reset its rhythm.  Ablation. This procedure destroys the part of the heart tissue that sends abnormal signals.  Left atrial appendage occlusion/excision. This seals off a common place in the atria where blood clots can form (left atrial appendage). The goal of treatment is to prevent blood clots from forming and to keep your heart beating at a normal rate and rhythm. Treatment depends on underlying medical conditions and how you feel when you are experiencing fibrillation. Follow these instructions at home: Medicines  Take over-the counter and prescription medicines only as told by your health care provider.  If your health care provider prescribed a blood-thinning medicine (anticoagulant), take it exactly as told. Taking too much blood-thinning medicine can cause bleeding. Taking too little can enable a blood clot to form and travel to the brain, causing a stroke. Lifestyle      Do not use any products that contain nicotine or tobacco, such as cigarettes and e-cigarettes. If you need help quitting, ask your health   care provider.  Do not drink beverages that contain caffeine, such as coffee, soda, and tea.  Follow diet instructions as told by your health care provider.  Exercise regularly as  told by your health care provider.  Do not drink alcohol. General instructions  If you have obstructive sleep apnea, manage your condition as told by your health care provider.  Maintain a healthy weight. Do not use diet pills unless your health care provider approves. Diet pills may make heart problems worse.  Keep all follow-up visits as told by your health care provider. This is important. Contact a health care provider if you:  Notice a change in the rate, rhythm, or strength of your heartbeat.  Are taking an anticoagulant and you notice increased bruising.  Tire more easily when you exercise or exert yourself.  Have a sudden change in weight. Get help right away if you have:   Chest pain, abdominal pain, sweating, or weakness.  Difficulty breathing.  Blood in your vomit, stool (feces), or urine.  Any symptoms of a stroke. "BE FAST" is an easy way to remember the main warning signs of a stroke: ? B - Balance. Signs are dizziness, sudden trouble walking, or loss of balance. ? E - Eyes. Signs are trouble seeing or a sudden change in vision. ? F - Face. Signs are sudden weakness or numbness of the face, or the face or eyelid drooping on one side. ? A - Arms. Signs are weakness or numbness in an arm. This happens suddenly and usually on one side of the body. ? S - Speech. Signs are sudden trouble speaking, slurred speech, or trouble understanding what people say. ? T - Time. Time to call emergency services. Write down what time symptoms started.  Other signs of a stroke, such as: ? A sudden, severe headache with no known cause. ? Nausea or vomiting. ? Seizure. These symptoms may represent a serious problem that is an emergency. Do not wait to see if the symptoms will go away. Get medical help right away. Call your local emergency services (911 in the [place] Do not drive yourself to the hospital. Summary  Atrial fibrillation is a type of irregular or rapid heartbeat  (arrhythmia).  Symptoms include a feeling that your heart is beating fast or irregularly. In some cases, you may not have symptoms.  The condition is treated with medicines to slow down the heart rate or bring the heart's rhythm back to normal. You may also need blood-thinning medicines to prevent blood clots.  Get help right away if you have symptoms or signs of a stroke. This information is not intended to replace advice given to you by your health care provider. Make sure you discuss any questions you have with your health care provider. Document Released: 11/14/2005 Document Revised: 01/04/2018 Document Reviewed: 01/05/2018 Elsevier Patient Education  2020 [employer]  

## 2019-08-20 NOTE — Progress Notes (Signed)
  Subjective:     Patient ID: Sean [name], male   DOB: 01-25-1956, 63 y.o.   MRN: [id number]  HPI   Patient recently diagnosed with atrial fibrillation of uncertain duration.  Chadsvasc2 score of 0.  He had had interaction with the nurse anesthetist and a couple other people peripherally associated with medicine who had questioned why he was not on anticoagulant.  He is basically here to discuss that again today.  His pulse has been relatively stable and we had elected not to start beta-blocker at last visit.  He has follow-up with cardiology scheduled for next week.  He has still been exercising with exercise bike and tolerating fairly well.  His pulse has been up slightly higher than usual with exercise.  No chest pains.  No history of hypertension or diabetes.  No history of cerebrovascular disease, heart failure, or peripheral vascular disease.  Patient states that he does binge occasionally with alcohol and we explained that that can sometimes be a trigger for atrial fibrillation.  Recent thyroid functions and chemistries normal.  No recent chest pain.  History reviewed. No pertinent past medical history. Past Surgical History:  Procedure Laterality Date  . CATARACT EXTRACTION, BILATERAL    . EYE SURGERY  07/30/2019    reports that he has been smoking cigars. He has never used smokeless tobacco. He reports current alcohol use of about 4.0 - 5.0 standard drinks of alcohol per week. He reports that he does not use drugs. family history is not on file. No Known Allergies   Review of Systems  Constitutional: Negative for fatigue.  Eyes: Negative for visual disturbance.  Respiratory: Negative for cough, chest tightness and shortness of breath.   Cardiovascular: Negative for chest pain, palpitations and leg swelling.  Neurological: Negative for dizziness, syncope, weakness, light-headedness and headaches.       Objective:   Physical Exam Vitals signs reviewed.  Constitutional:     Appearance: Normal appearance.  Cardiovascular:     Comments: Irregular rhythm with rate around 105 Pulmonary:     Effort: Pulmonary effort is normal.     Breath sounds: Normal breath sounds.  Musculoskeletal:     Right lower leg: No edema.     Left lower leg: No edema.  Neurological:     Mental Status: He is alert.        Assessment:     Atrial fibrillation recently diagnosed of unknown duration.  Chadsvasc2 score of 0    Plan:     -Reviewed in some detail issues regarding anticoagulation.  We explained that with Chadsvasc2 score of 0 generally anticoagulation is not given.  We will defer this to cardiology.  We also discussed possible low-dose beta-blocker but at this point he is basically asymptomatic and will defer to cardiology since his rate is relatively stable. -have cautioned about ETOH binging.  Sean Post MD [facility] at [facility]

## 2019-08-26 ENCOUNTER — Telehealth: Payer: Self-pay

## 2019-08-26 DIAGNOSIS — I4891 Unspecified atrial fibrillation: Secondary | ICD-10-CM

## 2019-08-26 NOTE — Telephone Encounter (Signed)
-----   Message from Josue Hector, MD sent at 08/26/2019  9:59 AM EDT ----- See if we can get echo before his new patient visit for new onset afib

## 2019-08-26 NOTE — Progress Notes (Signed)
CARDIOLOGY CONSULT NOTE       Patient ID: Sean [name] MRN: [id number] DOB/AGE: [id number] y.o.  Admit date: (Not on file) Referring Physician: Burchette Primary Physician: Eulas Post, MD Primary Cardiologist: New Reason for Consultation: Afib  Active Problems:   * No active hospital problems. *   HPI:  63 y.o. with CHADVASC 0 referred by Dr Elease Hashimoto for new onset afib  Asymptomatic noted during office visit 07/31/19 No chest pain , palpitations, dyspnea or syncope. Labs normal including TSH Works as a Astronomer since 99  Active including scuba diving  Previous skin cancer followed by Dermatology  Has cut back on ETOH since visit with primary Drinks wine/ vodka Dives only once/twice / year. Two older children doing ok. No bleeding issues   Discussed rate control vs restoration of SR. Also discussed anticoagulation and DCC. Feel every young patient deserves one shot at [facility] to restore NSR to prevent cardiomegaly and long term complications of afib   ROS All other systems reviewed and negative except as noted above  History reviewed. No pertinent past medical history.  History reviewed. No pertinent family history.  Social History   Socioeconomic History  . Marital status: Married    Spouse name: Not on file  . Number of children: Not on file  . Years of education: Not on file  . Highest education level: Not on file  Occupational History  . Not on file  Social Needs  . Financial resource strain: Not on file  . Food insecurity    Worry: Not on file    Inability: Not on file  . Transportation needs    Medical: Not on file    Non-medical: Not on file  Tobacco Use  . Smoking status: Current Some Day Smoker    Types: Cigars  . Smokeless tobacco: Never Used  Substance and Sexual Activity  . Alcohol use: Yes    Alcohol/week: 4.0 - 5.0 standard drinks    Types: 4 - 5 Cans of beer per week  . Drug use: No  . Sexual activity: Not  on file  Lifestyle  . Physical activity    Days per week: Not on file    Minutes per session: Not on file  . Stress: Not on file  Relationships  . Social Herbalist on phone: Not on file    Gets together: Not on file    Attends religious service: Not on file    Active member of club or organization: Not on file    Attends meetings of clubs or organizations: Not on file    Relationship status: Not on file  . Intimate partner violence    Fear of current or ex partner: Not on file    Emotionally abused: Not on file    Physically abused: Not on file    Forced sexual activity: Not on file  Other Topics Concern  . Not on file  Social History Narrative  . Not on file    Past Surgical History:  Procedure Laterality Date  . CATARACT EXTRACTION, BILATERAL    . EYE SURGERY  07/30/2019        Physical Exam: Blood pressure 112/82, pulse 97, height 6\' 1"  (1.854 m), weight 198 lb 12.8 oz (90.2 kg), SpO2 96 %.   Affect appropriate Healthy:  appears stated age 63: normal Neck supple with no adenopathy JVP normal no bruits no thyromegaly Lungs clear with no wheezing and  good diaphragmatic motion Heart:  S1/S2 no murmur, no rub, gallop or click PMI normal Abdomen: benighn, BS positve, no tenderness, no AAA no bruit.  No HSM or HJR Distal pulses intact with no bruits No edema Neuro non-focal Skin warm and dry No muscular weakness   Labs:   Lab Results  Component Value Date   WBC 5.8 07/31/2019   HGB 14.1 07/31/2019   HCT 41.8 07/31/2019   MCV 101.5 (H) 07/31/2019   PLT 167.0 07/31/2019   No results for input(s): NA, K, CL, CO2, BUN, CREATININE, CALCIUM, PROT, BILITOT, ALKPHOS, ALT, AST, GLUCOSE in the last 168 hours.  Invalid input(s): LABALBU No results found for: CKTOTAL, CKMB, CKMBINDEX, TROPONINI  Lab Results  Component Value Date   CHOL 178 08/31/2018   CHOL 157 09/21/2017   CHOL 185 07/01/2015   Lab Results  Component Value Date   HDL 56.80  08/31/2018   HDL 56.40 09/21/2017   HDL 61.90 07/01/2015   Lab Results  Component Value Date   LDLCALC 88 08/31/2018   LDLCALC 62 09/21/2017   LDLCALC 93 07/01/2015   Lab Results  Component Value Date   TRIG 167.0 (H) 08/31/2018   TRIG 194.0 (H) 09/21/2017   TRIG 147.0 07/01/2015   Lab Results  Component Value Date   CHOLHDL 3 08/31/2018   CHOLHDL 3 09/21/2017   CHOLHDL 3 07/01/2015   Lab Results  Component Value Date   LDLDIRECT 137.4 10/15/2010   LDLDIRECT 136.8 08/21/2008      Radiology: No results found.  EKG: Afib rate 103 07/31/19    ASSESSMENT AND PLAN:   1. Atrial Fibrillation: asymptomatic with CHADVASC 0 will start eliquis 5 bid check echo for EF and atrial sizes. Plan DCC in 3-4 weeks Discussed continuing to lower ETOH intake as this is likely cause of his PAF:  " Holiday Heart Syndrome"    Signed: Charlton Haws 08/28/2019, 8:29 AM

## 2019-08-26 NOTE — Telephone Encounter (Signed)
Will send message to scheduling to call patient and see about getting appt for echo before appt on Wednesday.

## 2019-08-27 ENCOUNTER — Other Ambulatory Visit: Payer: Self-pay | Admitting: Family Medicine

## 2019-08-27 NOTE — Telephone Encounter (Signed)
Requested medication (s) are due for refill today: yes  Requested medication (s) are on the active medication list: yes  Last refill:  06/19/2019  Future visit scheduled: no  Notes to clinic:  Refill cannot be delegated    Requested Prescriptions  Pending Prescriptions Disp Refills   zolpidem (AMBIEN) 10 MG tablet 30 tablet 0    Sig: Take 1 tablet (10 mg total) by mouth at bedtime as needed. for sleep     Not Delegated - Psychiatry:  Anxiolytics/Hypnotics Failed - 08/27/2019  1:57 PM      Failed - This refill cannot be delegated      Failed - Urine Drug Screen completed in last 360 days.      Passed - Valid encounter within last 6 months    Recent Outpatient Visits          1 week ago Atrial fibrillation, unspecified type ([place])   [facility]rap[facility], [facility] at [facility], Alinda Sierras, MD   3 weeks ago New onset atrial fibrillation ([place])   Therapist, music at [facility], Alinda Sierras, MD   10 months ago Eczema, unspecified type   Therapist, music at [place], MD   12 months ago Physical exam   Therapist, music at [facility], Alinda Sierras, MD   1 year ago Non-seasonal allergic rhinitis due to pollen   [employer] [facility], Doretha Sou, MD      Future Appointments            Tomorrow Johnsie Cancel, Wallis Bamberg, MD [place], [facility]

## 2019-08-27 NOTE — Telephone Encounter (Signed)
Copied from [place] [id number]. Topic: Quick Communication - Rx Refill/Question >> Aug 27, 2019  1:51 PM Leward Quan A wrote: Medication: zolpidem (AMBIEN) 10 MG tablet   Has the patient contacted their pharmacy? Yes.   (Agent: If no, request that the patient contact the pharmacy for the refill.) (Agent: If yes, when and what did the pharmacy advise?)  Preferred Pharmacy (with phone number or street name): [facility] [place], [place] [phone] (Phone) [phone] (Fax)    Agent: Please be advised that RX refills may take up to 3 business days. We ask that you follow-up with your pharmacy.

## 2019-08-28 ENCOUNTER — Other Ambulatory Visit: Payer: Self-pay

## 2019-08-28 ENCOUNTER — Encounter: Payer: Self-pay | Admitting: Cardiovascular Disease

## 2019-08-28 ENCOUNTER — Ambulatory Visit: Payer: BC Managed Care – PPO | Admitting: Cardiovascular Disease

## 2019-08-28 VITALS — BP 112/82 | HR 97 | Ht 73.0 in | Wt 198.8 lb

## 2019-08-28 DIAGNOSIS — I480 Paroxysmal atrial fibrillation: Secondary | ICD-10-CM

## 2019-08-28 MED ORDER — ZOLPIDEM TARTRATE 10 MG PO TABS: 10.0000 mg | ORAL_TABLET | Freq: Every evening | ORAL | 0 refills | Status: DC | PRN

## 2019-08-28 MED ORDER — APIXABAN 5 MG PO TABS: 5.0000 mg | ORAL_TABLET | Freq: Two times a day (BID) | ORAL | 3 refills | Status: DC

## 2019-08-28 NOTE — Telephone Encounter (Signed)
Forwarding to PCP for approval. If refill is appropriate please send electronically

## 2019-08-28 NOTE — Patient Instructions (Addendum)
Medication Instructions:  Your physician has recommended you make the following change in your medication:  1-START Eliquis 5 mg by mouth twice daily. 2- STOP Aspirin  If you need a refill on your cardiac medications before your next appointment, please call your pharmacy.   Lab work:  If you have labs (blood work) drawn today and your tests are completely normal, you will receive your results only by: Marland Kitchen MyChart Message (if you have MyChart) OR . A paper copy in the mail If you have any lab test that is abnormal or we need to change your treatment, we will call you to review the results.  Testing/Procedures: Your physician has requested that you have an echocardiogram. Echocardiography is a painless test that uses sound waves to create images of your heart. It provides your doctor with information about the size and shape of your heart and how well your heart's chambers and valves are working. This procedure takes approximately one hour. There are no restrictions for this procedure.  Follow-Up: At [facility], you and your health needs are our priority.  As part of our continuing mission to provide you with exceptional heart care, we have created designated Provider Care Teams.  These Care Teams include your primary Cardiologist (physician) and Advanced Practice Providers (APPs -  Physician Assistants and Nurse Practitioners) who all work together to provide you with the care you need, when you need it. You will need a follow up appointment in 3 to 4 weeks.  You may see Dr. Johnsie Cancel or one of the following Advanced Practice Providers on your designated Care Team:   Truitt Merle, NP Cecilie Kicks, NP . Kathyrn Drown, NP

## 2019-08-29 ENCOUNTER — Other Ambulatory Visit: Payer: Self-pay | Admitting: Family Medicine

## 2019-09-06 ENCOUNTER — Other Ambulatory Visit: Payer: Self-pay

## 2019-09-06 ENCOUNTER — Ambulatory Visit (HOSPITAL_COMMUNITY): Payer: Self-pay | Attending: Cardiology

## 2019-09-06 DIAGNOSIS — I4891 Unspecified atrial fibrillation: Secondary | ICD-10-CM

## 2019-09-11 ENCOUNTER — Telehealth: Payer: Self-pay | Admitting: *Deleted

## 2019-09-11 DIAGNOSIS — I480 Paroxysmal atrial fibrillation: Secondary | ICD-10-CM

## 2019-09-11 NOTE — Telephone Encounter (Signed)
Pt has been notified of echo results by phone with verbal understanding. Pt did ask if Dr. Johnsie Cancel said anything in regards to a DCCV. Pt states Dr. Johnsie Cancel d/w him at the last appt. I stated he did not make any mention in the echo results/interpretation at this time. Pt is agreeable to repeat echo in 1 yr. Order placed today. Patient notified of result.  Please refer to phone note from today for complete details.   Julaine Hua, CMA 09/11/2019 1:37 PM

## 2019-09-11 NOTE — Telephone Encounter (Signed)
-----   Message from Josue Hector, MD sent at 09/08/2019  9:38 AM EDT ----- EF normal mild to moderate AR f/u echo in a year

## 2019-09-16 ENCOUNTER — Ambulatory Visit: Payer: BC Managed Care – PPO | Admitting: Cardiovascular Disease

## 2019-09-18 NOTE — Progress Notes (Addendum)
CARDIOLOGY OFFICE NOTE  Date:  09/25/2019    Sean [name] Date of Birth: 09/21/1956 Medical Record #[id number]  PCP:  Kristian Covey, MD  Cardiologist:  Eden Emms  Chief Complaint  Patient presents with  . Atrial Fibrillation    Seen for Dr. Eden Emms    History of Present Illness: Sean [name] is a 63 y.o. male who presents today for a one month check. Seen for Dr. Eden Emms.   He was seen last month at request of PCP for new onset AF - CHADSVASC of 0. Not symptomatic. Has cut back alcohol use. Dr. Eden Emms discussed rate control versus restoration strategies. Eliquis was started. To return for arrangement of cardioversion if remained in AF.   The patient does not have symptoms concerning for COVID-19 infection (fever, chills, cough, or new shortness of breath).   Comes in today. Here alone. He feels pretty good. Really has no awareness of his atrial fib. Some rare palpitation. Not dizzy or lightheaded. No chest pain. Remains pretty active.   Past Medical History:  Diagnosis Date  . Atrial fibrillation [facility])     Past Surgical History:  Procedure Laterality Date  . CATARACT EXTRACTION, BILATERAL    . EYE SURGERY  07/30/2019     Medications: Current Meds  Medication Sig  . apixaban (ELIQUIS) 5 MG TABS tablet Take 1 tablet (5 mg total) by mouth 2 (two) times daily.  Marland Kitchen escitalopram (LEXAPRO) 10 MG tablet Take 10 mg by mouth as needed.  . Multiple Vitamins-Minerals (CENTRUM SILVER ADULT 50+ PO) Take by mouth.  . pseudoephedrine (SUDAFED) 120 MG 12 hr tablet Take by mouth as needed.   . zolpidem (AMBIEN) 10 MG tablet Take 1 tablet (10 mg total) by mouth at bedtime as needed. for sleep     Allergies: No Known Allergies  Social History: The patient  reports that he has been smoking cigars. He has never used smokeless tobacco. He reports current alcohol use of about 4.0 - 5.0 standard drinks of alcohol per week. He reports that he does not use drugs.   Family  History: The patient's family history is not on file. His father died at 70 - had sudden death - no autopsy. Mother died at 39 with colon cancer.   Review of Systems: Please see the history of present illness.   All other systems are reviewed and negative.   Physical Exam: VS:  BP 120/68   Pulse 94   Ht 6\' 1"  (1.854 m)   Wt 202 lb (91.6 kg)   SpO2 97%   BMI 26.65 kg/m  .  BMI Body mass index is 26.65 kg/m.  Wt Readings from Last 3 Encounters:  09/25/19 202 lb (91.6 kg)  08/28/19 198 lb 12.8 oz (90.2 kg)  08/20/19 204 lb (92.5 kg)    General: Pleasant. Well developed, well nourished and in no acute distress. He is tall.   HEENT: Normal.  Neck: Supple, no JVD, carotid bruits, or masses noted.  Cardiac: Irregular irregular rhythm. Rate is ok. No edema.  Respiratory:  Lungs are clear to auscultation bilaterally with normal work of breathing.  GI: Soft and nontender.  MS: No deformity or atrophy. Gait and ROM intact.  Skin: Warm and dry. Color is normal.  Neuro:  Strength and sensation are intact and no gross focal deficits noted.  Psych: Alert, appropriate and with normal affect.   LABORATORY DATA:  EKG:  EKG is ordered today. This demonstrates AF with fair VR -  HR is 94.  Lab Results  Component Value Date   WBC 5.8 07/31/2019   HGB 14.1 07/31/2019   HCT 41.8 07/31/2019   PLT 167.0 07/31/2019   GLUCOSE 98 07/31/2019   CHOL 178 08/31/2018   TRIG 167.0 (H) 08/31/2018   HDL 56.80 08/31/2018   LDLDIRECT 137.4 10/15/2010   LDLCALC 88 08/31/2018   ALT 42 07/31/2019   AST 38 (H) 07/31/2019   NA 138 07/31/2019   K 4.2 07/31/2019   CL 103 07/31/2019   CREATININE 1.10 07/31/2019   BUN 18 07/31/2019   CO2 27 07/31/2019   TSH 1.98 07/31/2019   PSA 3.14 08/31/2018     BNP (last 3 results) No results for input(s): BNP in the last 8760 hours.  ProBNP (last 3 results) No results for input(s): PROBNP in the last 8760 hours.   Other Studies Reviewed Today:  ECHO  IMPRESSIONS 08/2019   1. Left ventricular ejection fraction, by visual estimation, is 55 to 60%. The left ventricle has normal function. Normal left ventricular size. There is mildly increased left ventricular hypertrophy.  2. NA due to afib pattern of LV diastolic filling.  3. Global right ventricle has mildly reduced systolic function.The right ventricular size is normal. No increase in right ventricular wall thickness.  4. Left atrial size was normal.  5. Right atrial size was normal.  6. The mitral valve is normal in structure. Trace mitral valve regurgitation. No evidence of mitral stenosis.  7. The tricuspid valve is normal in structure. Tricuspid valve regurgitation is trivial.  8. The aortic valve is tricuspid Aortic valve regurgitation is mild to moderate by color flow Doppler. Structurally normal aortic valve, with no evidence of sclerosis or stenosis.  9. The pulmonic valve was grossly normal. Pulmonic valve regurgitation is not visualized by color flow Doppler. 10. Aortic dilatation noted. 11. There is moderate dilatation of the ascending aorta measuring 40 mm. 12. Normal pulmonary artery systolic pressure. 13. The inferior vena cava is normal in size with greater than 50% respiratory variability, suggesting right atrial pressure of 3 mmHg.   Notes recorded by Josue Hector, MD on 09/08/2019 at 9:38 AM EDT  EF normal mild to moderate AR f/u echo in a year   Assessment/Plan:  1. Atrial fib - persistent - has normal atrial size - hopefully will be able to restore NSR. He has had a month of anticoagulation uninterrupted. He is agreeable to proceeding with cardioversion. Procedure/risk/benefits reviewed in detail and he is willing to proceed - arranged for Tuesday November 3rd - COVID testing this Friday. Lab today. He is to continue his Eliquis.   2. Anticoagulation - no problems noted. He will need 4 to 6 weeks of therapy post cardioversion and then will discuss with Dr. Johnsie Cancel  about long term use.   3. 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   4. Dilated aorta - will need following - consider arranging imaging on return. His father died with sudden death - this a presumed MI. Precautions were discussed (Bp control, no Valsalva, no Fluoroquinolones).    5. Mild to moderate AI - will need following - to have echo in one year.    6. COVID-19 Education: The signs and symptoms of COVID-19 were discussed with the patient and how to seek care for testing (follow up with PCP or arrange E-visit).  The importance of social distancing, staying at home, hand hygiene and wearing a mask when out in public were discussed today.  Current medicines are reviewed with the patient today.  The patient does not have concerns regarding medicines other than what has been noted above.  The following changes have been made:  See above.  Labs/ tests ordered today include:    Orders Placed This Encounter  Procedures  . CBC  . PT and PTT  . Basic metabolic panel  . EKG 12-Lead     Disposition:   FU with us in a few weeks post cardioversion.    Patient is agreeable to this plan and will call if any problems develop in the interim.   SignedNorma Fredrickson: Falyn Rubel, NP  09/25/2019 4:18 PM  [facility] [address] [place], [place]  [address] Phone: [phone] Fax: [phone]

## 2019-09-18 NOTE — H&P (View-Only) (Signed)
CARDIOLOGY OFFICE NOTE  Date:  09/25/2019    Sean [name] Date of Birth: 09/21/1956 Medical Record #[id number]  PCP:  Sean Covey, MD  Cardiologist:  Sean [name]  Chief Complaint  Patient presents with  . Atrial Fibrillation    Seen for Dr. Eden [name]    History of Present Illness: Sean [name] is a 63 y.o. male who presents today for a one month check. Seen for Dr. Eden [name].   He was seen last month at request of PCP for new onset AF - CHADSVASC of 0. Not symptomatic. Has cut back alcohol use. Dr. Eden [name] discussed rate control versus restoration strategies. Eliquis was started. To return for arrangement of cardioversion if remained in AF.   The patient does not have symptoms concerning for COVID-19 infection (fever, chills, cough, or new shortness of breath).   Comes in today. Here alone. He feels pretty good. Really has no awareness of his atrial fib. Some rare palpitation. Not dizzy or lightheaded. No chest pain. Remains pretty active.   Past Medical History:  Diagnosis Date  . Atrial fibrillation [facility])     Past Surgical History:  Procedure Laterality Date  . CATARACT EXTRACTION, BILATERAL    . EYE SURGERY  07/30/2019     Medications: Current Meds  Medication Sig  . apixaban (ELIQUIS) 5 MG TABS tablet Take 1 tablet (5 mg total) by mouth 2 (two) times daily.  Marland Kitchen escitalopram (LEXAPRO) 10 MG tablet Take 10 mg by mouth as needed.  . Multiple Vitamins-Minerals (CENTRUM SILVER ADULT 50+ PO) Take by mouth.  . pseudoephedrine (SUDAFED) 120 MG 12 hr tablet Take by mouth as needed.   . zolpidem (AMBIEN) 10 MG tablet Take 1 tablet (10 mg total) by mouth at bedtime as needed. for sleep     Allergies: No Known Allergies  Social History: The patient  reports that he has been smoking cigars. He has never used smokeless tobacco. He reports current alcohol use of about 4.0 - 5.0 standard drinks of alcohol per week. He reports that he does not use drugs.   Family  History: The patient's family history is not on file. His father died at 70 - had sudden death - no autopsy. Mother died at 39 with colon cancer.   Review of Systems: Please see the history of present illness.   All other systems are reviewed and negative.   Physical Exam: VS:  BP 120/68   Pulse 94   Ht 6\' 1"  (1.854 m)   Wt 202 lb (91.6 kg)   SpO2 97%   BMI 26.65 kg/m  .  BMI Body mass index is 26.65 kg/m.  Wt Readings from Last 3 Encounters:  09/25/19 202 lb (91.6 kg)  08/28/19 198 lb 12.8 oz (90.2 kg)  08/20/19 204 lb (92.5 kg)    General: Pleasant. Well developed, well nourished and in no acute distress. He is tall.   HEENT: Normal.  Neck: Supple, no JVD, carotid bruits, or masses noted.  Cardiac: Irregular irregular rhythm. Rate is ok. No edema.  Respiratory:  Lungs are clear to auscultation bilaterally with normal work of breathing.  GI: Soft and nontender.  MS: No deformity or atrophy. Gait and ROM intact.  Skin: Warm and dry. Color is normal.  Neuro:  Strength and sensation are intact and no gross focal deficits noted.  Psych: Alert, appropriate and with normal affect.   LABORATORY DATA:  EKG:  EKG is ordered today. This demonstrates AF with fair VR -  HR is 94.  Lab Results  Component Value Date   WBC 5.8 07/31/2019   HGB 14.1 07/31/2019   HCT 41.8 07/31/2019   PLT 167.0 07/31/2019   GLUCOSE 98 07/31/2019   CHOL 178 08/31/2018   TRIG 167.0 (H) 08/31/2018   HDL 56.80 08/31/2018   LDLDIRECT 137.4 10/15/2010   LDLCALC 88 08/31/2018   ALT 42 07/31/2019   AST 38 (H) 07/31/2019   NA 138 07/31/2019   K 4.2 07/31/2019   CL 103 07/31/2019   CREATININE 1.10 07/31/2019   BUN 18 07/31/2019   CO2 27 07/31/2019   TSH 1.98 07/31/2019   PSA 3.14 08/31/2018     BNP (last 3 results) No results for input(s): BNP in the last 8760 hours.  ProBNP (last 3 results) No results for input(s): PROBNP in the last 8760 hours.   Other Studies Reviewed Today:  ECHO  IMPRESSIONS 08/2019   1. Left ventricular ejection fraction, by visual estimation, is 55 to 60%. The left ventricle has normal function. Normal left ventricular size. There is mildly increased left ventricular hypertrophy.  2. NA due to afib pattern of LV diastolic filling.  3. Global right ventricle has mildly reduced systolic function.The right ventricular size is normal. No increase in right ventricular wall thickness.  4. Left atrial size was normal.  5. Right atrial size was normal.  6. The mitral valve is normal in structure. Trace mitral valve regurgitation. No evidence of mitral stenosis.  7. The tricuspid valve is normal in structure. Tricuspid valve regurgitation is trivial.  8. The aortic valve is tricuspid Aortic valve regurgitation is mild to moderate by color flow Doppler. Structurally normal aortic valve, with no evidence of sclerosis or stenosis.  9. The pulmonic valve was grossly normal. Pulmonic valve regurgitation is not visualized by color flow Doppler. 10. Aortic dilatation noted. 11. There is moderate dilatation of the ascending aorta measuring 40 mm. 12. Normal pulmonary artery systolic pressure. 13. The inferior vena cava is normal in size with greater than 50% respiratory variability, suggesting right atrial pressure of 3 mmHg.   Notes recorded by Sean Hector, MD on 09/08/2019 at 9:38 AM EDT  EF normal mild to moderate AR f/u echo in a year   Assessment/Plan:  1. Atrial fib - persistent - has normal atrial size - hopefully will be able to restore NSR. He has had a month of anticoagulation uninterrupted. He is agreeable to proceeding with cardioversion. Procedure/risk/benefits reviewed in detail and he is willing to proceed - arranged for Tuesday November 3rd - COVID testing this Friday. Lab today. He is to continue his Eliquis.   2. Anticoagulation - no problems noted. He will need 4 to 6 weeks of therapy post cardioversion and then will discuss with Dr. Johnsie [name]  about long term use.   3. 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   4. Dilated aorta - will need following - consider arranging imaging on return. His father died with sudden death - this a presumed MI. Precautions were discussed (Bp control, no Valsalva, no Fluoroquinolones).    5. Mild to moderate AI - will need following - to have echo in one year.    6. COVID-19 Education: The signs and symptoms of COVID-19 were discussed with the patient and how to seek care for testing (follow up with PCP or arrange E-visit).  The importance of social distancing, staying at home, hand hygiene and wearing a mask when out in public were discussed today.  Current medicines are reviewed with the patient today.  The patient does not have concerns regarding medicines other than what has been noted above.  The following changes have been made:  See above.  Labs/ tests ordered today include:    Orders Placed This Encounter  Procedures  . CBC  . PT and PTT  . Basic metabolic panel  . EKG 12-Lead     Disposition:   FU with us in a few weeks post cardioversion.    Patient is agreeable to this plan and will call if any problems develop in the interim.   Signed: Blake Goya, NP  09/25/2019 4:18 PM  [facility] [address] [place], [place]  [address] Phone: [phone] Fax: [phone]        

## 2019-09-25 ENCOUNTER — Ambulatory Visit: Payer: BC Managed Care – PPO | Admitting: Nurse Practitioner

## 2019-09-25 ENCOUNTER — Other Ambulatory Visit: Payer: Self-pay

## 2019-09-25 ENCOUNTER — Encounter: Payer: Self-pay | Admitting: Nurse Practitioner

## 2019-09-25 ENCOUNTER — Other Ambulatory Visit: Payer: Self-pay | Admitting: Nurse Practitioner

## 2019-09-25 VITALS — BP 120/68 | HR 94 | Ht 73.0 in | Wt 202.0 lb

## 2019-09-25 DIAGNOSIS — I351 Nonrheumatic aortic (valve) insufficiency: Secondary | ICD-10-CM

## 2019-09-25 DIAGNOSIS — Z7189 Other specified counseling: Secondary | ICD-10-CM

## 2019-09-25 DIAGNOSIS — I4819 Other persistent atrial fibrillation: Secondary | ICD-10-CM

## 2019-09-25 DIAGNOSIS — Z7901 Long term (current) use of anticoagulants: Secondary | ICD-10-CM

## 2019-09-25 DIAGNOSIS — I480 Paroxysmal atrial fibrillation: Secondary | ICD-10-CM | POA: Diagnosis not present

## 2019-09-25 DIAGNOSIS — I7789 Other specified disorders of arteries and arterioles: Secondary | ICD-10-CM

## 2019-09-25 NOTE — Patient Instructions (Addendum)
After Visit Summary:  We will be checking the following labs today - BMET, CBC, PT and PTT   Medication Instructions:    Continue with your current medicines.    If you need a refill on your cardiac medications before your next appointment, please call your pharmacy.     Testing/Procedures To Be Arranged:  Cardioversion  Follow-Up:   See Dr. Eden Emms in about a month with EKG    At [facility], you and your health needs are our priority.  As part of our continuing mission to provide you with exceptional heart care, we have created designated Provider Care Teams.  These Care Teams include your primary Cardiologist (physician) and Advanced Practice Providers (APPs -  Physician Assistants and Nurse Practitioners) who all work together to provide you with the care you need, when you need it.  Special Instructions:  . Stay safe, stay home, wash your hands for at least 20 seconds and wear a mask when out in public.  . It was good to talk with you today.   Your provider has recommended a cardioversion.   COVID SCREENING INFORMATION: You are scheduled for your COVID screening on Friday, October 30th at ___________________________. [employer] (old [facility]) [address] Stay in the RIGHT lane and proceed under the brick awning (NOT the tent) and tell them you are there for pre-procedure testing Do NOT bring any pets with you to the testing site Go before 3:15PM on your appointment date.    You are scheduled for a cardioversion on Tuesday, November 3rd at 2:30PM with Dr. Elease Hashimoto or associates. Please go to [facility] ([address] 2nd Floor Short Stay at Tuesday, November 3rd at 1:00PM.  There is free valet parking available.  Enter through the [employer] Do not have any food or drink after midnight on Monday.  You may take your medicines with a sip of water on the day of your procedure.   Hold the following medicines  NONE  DO NOT STOP YOUR ANTICOAGULANT (BLOOD THINNER) ELIQUIS - YOU WILL NEED TO CONTINUE YOUR ANTICOAGULANT AFTER YOUR PROCEDURE UNTIL YOU ARE TOLD BY YOUR PROVIDER IT IS SAFE TO STOP  You will need someone to drive you home following your procedure and stay in the waiting room during your procedure. Failure to do so could result in your procedure being cancelled.   Every effort is made to have your procedure done on time. Occasionally there are emergencies that occur at the hospital that may cause delays.   Call the [facility] office at [phone] if you have any questions, problems or concerns.     Electrical Cardioversion Electrical cardioversion is the delivery of a jolt of electricity to change the rhythm of the heart. Sticky patches or metal paddles are placed on the chest to deliver the electricity from a device. This is done to restore a normal rhythm. A rhythm that is too fast or not regular keeps the heart from pumping well. Electrical cardioversion is done in an emergency if:   There is low or no blood pressure as a result of the heart rhythm.    Normal rhythm must be restored as fast as possible to protect the brain and heart from further damage.    It may save a life. Cardioversion may be done for heart rhythms that are not immediately life threatening, such as atrial fibrillation or flutter, in which:   The heart  is beating too fast or is not regular.    Medicine to change the rhythm has not worked.    It is safe to wait in order to allow time for preparation.  Symptoms of the abnormal rhythm are bothersome.  The risk of stroke and other serious problems can be reduced.  LET [facility] CARE PROVIDER KNOW ABOUT:   Any allergies you have.  All medicines you are taking, including vitamins, herbs, eye drops, creams, and over-the-counter medicines.  Previous problems you or members of your family have had with the use of anesthetics.     Any blood disorders you have.    Previous surgeries you have had.    Medical conditions you have.   RISKS AND COMPLICATIONS  Generally, this is a safe procedure. However, problems can occur and include:   Breathing problems related to the anesthetic used.  A blood clot that breaks free and travels to other parts of your body. This could cause a stroke or other problems. The risk of this is lowered by use of blood-thinning medicine (anticoagulant) prior to the procedure.  Cardiac arrest (rare).   BEFORE THE PROCEDURE   You may have tests to detect blood clots in your heart and to evaluate heart function.   You may start taking anticoagulants so your blood does not clot as easily.    Medicines may be given to help stabilize your heart rate and rhythm.   PROCEDURE  You will be given medicine through an IV tube to reduce discomfort and make you sleepy (sedative).    An electrical shock will be delivered.   AFTER THE PROCEDURE Your heart rhythm will be watched to make sure it does not change. You will need someone to drive you home.      Call the [place] office at [phone] if you have any questions, problems or concerns.

## 2019-09-26 LAB — BASIC METABOLIC PANEL
BUN/Creatinine Ratio: 13 (ref 10–24)
BUN: 17 mg/dL (ref 8–27)
CO2: 22 mmol/L (ref 20–29)
Calcium: 9.3 mg/dL (ref 8.6–10.2)
Chloride: 107 mmol/L — ABNORMAL HIGH (ref 96–106)
Creatinine, Ser: 1.31 mg/dL — ABNORMAL HIGH (ref 0.76–1.27)
GFR calc Af Amer: 67 mL/min/{1.73_m2} (ref >59)
GFR calc non Af Amer: 58 mL/min/{1.73_m2} — ABNORMAL LOW (ref >59)
Glucose: 100 mg/dL — ABNORMAL HIGH (ref 65–99)
Potassium: 4 mmol/L (ref 3.5–5.2)
Sodium: 141 mmol/L (ref 134–144)

## 2019-09-26 LAB — PT AND PTT
INR: 1.1 (ref 0.9–1.2)
Prothrombin Time: 11.4 s (ref 9.1–12.0)
aPTT: 27 s (ref 24–33)

## 2019-09-26 LAB — CBC
Hematocrit: 44.1 % (ref 37.5–51.0)
Hemoglobin: 15.2 g/dL (ref 13.0–17.7)
MCH: 33.1 pg — ABNORMAL HIGH (ref 26.6–33.0)
MCHC: 34.5 g/dL (ref 31.5–35.7)
MCV: 96 fL (ref 79–97)
Platelets: 207 10*3/uL (ref 150–450)
RBC: 4.59 x10E6/uL (ref 4.14–5.80)
RDW: 11.6 % (ref 11.6–15.4)
WBC: 6.8 10*3/uL (ref 3.4–10.8)

## 2019-09-27 ENCOUNTER — Other Ambulatory Visit (HOSPITAL_COMMUNITY)
Admission: RE | Admit: 2019-09-27 | Discharge: 2019-09-27 | Disposition: A | Discharge status: Home / Self Care | Payer: BC Managed Care – PPO | Source: Ambulatory Visit | Attending: Cardiovascular Disease | Admitting: Cardiovascular Disease

## 2019-09-27 DIAGNOSIS — Z20828 Contact with and (suspected) exposure to other viral communicable diseases: Secondary | ICD-10-CM | POA: Diagnosis not present

## 2019-09-27 DIAGNOSIS — Z01812 Encounter for preprocedural laboratory examination: Secondary | ICD-10-CM | POA: Insufficient documentation

## 2019-09-28 LAB — NOVEL CORONAVIRUS, NAA (HOSP ORDER, SEND-OUT TO REF LAB; TAT 18-24 HRS): SARS-CoV-2, NAA: NOT DETECTED

## 2019-09-30 NOTE — Progress Notes (Signed)
Spoke with patient; has quarantined and has no symptoms. All questions answered.

## 2019-10-01 ENCOUNTER — Encounter (HOSPITAL_COMMUNITY): Payer: Self-pay | Admitting: Emergency Medicine

## 2019-10-01 ENCOUNTER — Other Ambulatory Visit: Payer: Self-pay

## 2019-10-01 ENCOUNTER — Ambulatory Visit (HOSPITAL_COMMUNITY): Payer: BC Managed Care – PPO | Admitting: Anesthesiology

## 2019-10-01 ENCOUNTER — Encounter (HOSPITAL_COMMUNITY)
Admission: RE | Disposition: A | Discharge status: Home / Self Care | Payer: Self-pay | Source: Ambulatory Visit | Attending: Cardiovascular Disease

## 2019-10-01 ENCOUNTER — Ambulatory Visit (HOSPITAL_COMMUNITY)
Admission: RE | Admit: 2019-10-01 | Discharge: 2019-10-01 | Disposition: A | Discharge status: Home / Self Care | Payer: BC Managed Care – PPO | Source: Ambulatory Visit | Attending: Cardiovascular Disease | Admitting: Cardiovascular Disease

## 2019-10-01 DIAGNOSIS — E785 Hyperlipidemia, unspecified: Secondary | ICD-10-CM | POA: Diagnosis not present

## 2019-10-01 DIAGNOSIS — I4819 Other persistent atrial fibrillation: Secondary | ICD-10-CM | POA: Diagnosis not present

## 2019-10-01 DIAGNOSIS — I4891 Unspecified atrial fibrillation: Secondary | ICD-10-CM | POA: Diagnosis not present

## 2019-10-01 DIAGNOSIS — F17290 Nicotine dependence, other tobacco product, uncomplicated: Secondary | ICD-10-CM | POA: Insufficient documentation

## 2019-10-01 DIAGNOSIS — Z79899 Other long term (current) drug therapy: Secondary | ICD-10-CM | POA: Diagnosis not present

## 2019-10-01 DIAGNOSIS — Z7901 Long term (current) use of anticoagulants: Secondary | ICD-10-CM | POA: Diagnosis not present

## 2019-10-01 DIAGNOSIS — N289 Disorder of kidney and ureter, unspecified: Secondary | ICD-10-CM | POA: Diagnosis not present

## 2019-10-01 HISTORY — PX: CARDIOVERSION: SHX1299

## 2019-10-01 SURGERY — CARDIOVERSION
Anesthesia: General

## 2019-10-01 MED ORDER — PROPOFOL 10 MG/ML IV BOLUS
INTRAVENOUS | Status: DC | PRN
  Administered 2019-10-01: 100 mg via INTRAVENOUS

## 2019-10-01 MED ORDER — SODIUM CHLORIDE 0.9 % IV SOLN
INTRAVENOUS | Status: DC
  Administered 2019-10-01: 14:00:00 via INTRAVENOUS

## 2019-10-01 MED ORDER — LIDOCAINE 2% (20 MG/ML) 5 ML SYRINGE
INTRAMUSCULAR | Status: DC | PRN
  Administered 2019-10-01: 80 mg via INTRAVENOUS

## 2019-10-01 NOTE — Anesthesia Preprocedure Evaluation (Addendum)
Anesthesia Evaluation  Patient identified by MRN, date of birth, ID band Patient awake    Reviewed: Allergy & Precautions, NPO status , Patient's Chart, lab work & pertinent test results  History of Anesthesia Complications Negative for: history of anesthetic complications  Airway Mallampati: II  TM Distance: >3 FB Neck ROM: Full    Dental  (+) Teeth Intact   Pulmonary Current Smoker and Patient abstained from smoking.,    Pulmonary exam normal        Cardiovascular + dysrhythmias Atrial Fibrillation + Valvular Problems/Murmurs AI  Rhythm:Irregular Rate:Tachycardia   '20 TTE - EF 55 to 60%. Mildly increased left ventricular hypertrophy. Global right ventricle has mildly reduced systolic function. Trace mitral valve regurgitation. Tricuspid valve regurgitation is trivial. AI is mild to moderate by color flow Doppler. There is moderate dilatation of the ascending aorta measuring 40 mm    Neuro/Psych negative neurological ROS  negative psych ROS   GI/Hepatic negative GI ROS, Neg liver ROS,   Endo/Other  negative endocrine ROS  Renal/GU Renal InsufficiencyRenal disease     Musculoskeletal negative musculoskeletal ROS (+)   Abdominal   Peds  Hematology negative hematology ROS (+)   Anesthesia Other Findings   Reproductive/Obstetrics                            Anesthesia Physical Anesthesia Plan  ASA: III  Anesthesia Plan: General   Post-op Pain Management:    Induction: Intravenous  PONV Risk Score and Plan: 1 and Treatment may vary due to age or medical condition and Propofol infusion  Airway Management Planned: Mask and Natural Airway  Additional Equipment: None  Intra-op Plan:   Post-operative Plan:   Informed Consent: I have reviewed the patients History and Physical, chart, labs and discussed the procedure including the risks, benefits and alternatives for the proposed  anesthesia with the patient or authorized representative who has indicated his/her understanding and acceptance.       Plan Discussed with: CRNA and Anesthesiologist  Anesthesia Plan Comments:        Anesthesia Quick Evaluation

## 2019-10-01 NOTE — Interval H&P Note (Signed)
History and Physical Interval Note:  10/01/2019 2:02 PM  Sean [name]  has presented today for surgery, with the diagnosis of ATRIAL FIBRILLATION.  The various methods of treatment have been discussed with the patient and family. After consideration of risks, benefits and other options for treatment, the patient has consented to  Procedure(s): CARDIOVERSION (N/A) as a surgical intervention.  The patient's history has been reviewed, patient examined, no change in status, stable for surgery.  I have reviewed the patient's chart and labs.  Questions were answered to the patient's satisfaction.     Mertie Moores

## 2019-10-01 NOTE — Discharge Instructions (Signed)

## 2019-10-01 NOTE — Anesthesia Procedure Notes (Signed)
Procedure Name: General with mask airway Date/Time: 10/01/2019 2:11 PM Performed by: Imagene Riches, CRNA Pre-anesthesia Checklist: Patient identified, Emergency Drugs available, Suction available and Patient being monitored Patient Re-evaluated:Patient Re-evaluated prior to induction Oxygen Delivery Method: Ambu bag

## 2019-10-01 NOTE — CV Procedure (Signed)
    Cardioversion Note  SAI ZINN [id number] [birth year]  Procedure: DC Cardioversion Indications: Atrial fib   Procedure Details Consent: Obtained Time Out: Verified patient identification, verified procedure, site/side was marked, verified correct patient position, special equipment/implants available, Radiology Safety Procedures followed,  medications/allergies/relevent history reviewed, required imaging and test results available.  Performed  The patient has been on adequate anticoagulation.  The patient received IV Lidocaine 80 mg iv followed by Propofol 100 mg IV  for sedation.  Synchronous cardioversion was performed at  200  joules.  The cardioversion was successful     Complications: No apparent complications Patient did tolerate procedure well.   Thayer Headings, Brooke Bonito., MD, [facility] 10/01/2019, 2:32 PM

## 2019-10-01 NOTE — Transfer of Care (Signed)
Immediate Anesthesia Transfer of Care Note  Patient: Sean [name]  Procedure(s) Performed: CARDIOVERSION (N/A )  Patient Location: Endoscopy Unit  Anesthesia Type:General  Level of Consciousness: drowsy  Airway & Oxygen Therapy: Patient Spontanous Breathing  Post-op Assessment: Report given to RN and Post -op Vital signs reviewed and stable  Post vital signs: Reviewed and stable  Last Vitals:  Vitals Value Taken Time  BP 99/66 10/01/19 1412  Temp 36.2 C 10/01/19 1412  Pulse 68 10/01/19 1412  Resp 47 10/01/19 1412  SpO2 92 % 10/01/19 1412    Last Pain:  Vitals:   10/01/19 1412  TempSrc: Temporal  PainSc: 0-No pain         Complications: No apparent anesthesia complications

## 2019-10-02 ENCOUNTER — Encounter (HOSPITAL_COMMUNITY): Payer: Self-pay | Admitting: Cardiovascular Disease

## 2019-10-02 ENCOUNTER — Other Ambulatory Visit: Payer: Self-pay | Admitting: Family Medicine

## 2019-10-02 NOTE — Anesthesia Postprocedure Evaluation (Signed)
Anesthesia Post Note  Patient: Sean [name]  Procedure(s) Performed: CARDIOVERSION (N/A )     Patient location during evaluation: PACU Anesthesia Type: General Level of consciousness: awake and alert Pain management: pain level controlled Vital Signs Assessment: post-procedure vital signs reviewed and stable Respiratory status: spontaneous breathing, nonlabored ventilation and respiratory function stable Cardiovascular status: blood pressure returned to baseline and stable Postop Assessment: no apparent nausea or vomiting Anesthetic complications: no    Last Vitals:  Vitals:   10/01/19 1420 10/01/19 1430  BP: 94/74 104/76  Pulse: (!) 58 62  Resp: 13 17  Temp:    SpO2: 97% 96%    Last Pain:  Vitals:   10/02/19 1412  TempSrc:   PainSc: 0-No pain                 [name]

## 2019-10-05 NOTE — Telephone Encounter (Signed)
Refill for one year 

## 2019-10-07 MED ORDER — ESCITALOPRAM OXALATE 10 MG PO TABS: 10.0000 mg | ORAL_TABLET | Freq: Every day | ORAL | 2 refills | Status: DC

## 2019-10-07 NOTE — Telephone Encounter (Signed)
Take one po daily.

## 2019-10-07 NOTE — Telephone Encounter (Signed)
Please advise on instructions, you never filled this before. Current med list says as needed in the past by Dr. Raliegh Ip it states daily

## 2019-10-08 NOTE — Progress Notes (Signed)
CARDIOLOGY OFFICE NOTE  Date:  10/10/2019    Sean [name] Date of Birth: 06-Apr-1956 Medical Record #[id number]  PCP:  Kristian Covey, MD  Cardiologist:  Francina Ames chief complaint on file.   History of Present Illness: Sean [name] is a 63 y.o. male first seen 08/28/19 for new onset PAF   CHADSVASC of 0. Not symptomatic. Has cut back alcohol use. Successful [facility] 10/01/19   Echo 09/06/19  EF 55-60% normal atrial size Mild/Moderate AR Trace MR  Back in afib today He noted higher HR;s at gym but is not very symptomatic discussed rational for AAT and repeat attempt before EP referral Normal EF and no history of CAD and normal baseline ECG Start flecainide   He is still able to ride bike 1.5 hours on [address] He has cut back to one glass of red wine nightly   The patient does not have symptoms concerning for COVID-19 infection (fever, chills, cough, or new shortness of breath).     Past Medical History:  Diagnosis Date  . Atrial fibrillation [facility])     Past Surgical History:  Procedure Laterality Date  . CARDIOVERSION N/A 10/01/2019   Procedure: CARDIOVERSION;  Surgeon: Elease Hashimoto, Deloris Ping, MD;  Location: [facility] ENDOSCOPY;  Service: Cardiovascular;  Laterality: N/A;  . CATARACT EXTRACTION, BILATERAL    . EYE SURGERY  07/30/2019     Medications: Current Meds  Medication Sig  . apixaban (ELIQUIS) 5 MG TABS tablet Take 1 tablet (5 mg total) by mouth 2 (two) times daily.  Marland Kitchen escitalopram (LEXAPRO) 10 MG tablet Take 1 tablet (10 mg total) by mouth daily.  . Multiple Vitamins-Minerals (CENTRUM SILVER ADULT 50+ PO) Take by mouth.  . zolpidem (AMBIEN) 10 MG tablet Take 1 tablet (10 mg total) by mouth at bedtime as needed. for sleep (Patient taking differently: Take 10 mg by mouth at bedtime as needed for sleep. )     Allergies: No Known Allergies  Social History: The patient  reports that he has been smoking cigars. He has never used smokeless tobacco. He  reports current alcohol use of about 4.0 - 5.0 standard drinks of alcohol per week. He reports that he does not use drugs.   Family History: The patient's family history is not on file. His father died at 47 - had sudden death - no autopsy. Mother died at 61 with colon cancer.   Review of Systems: Please see the history of present illness.   All other systems are reviewed and negative.   Physical Exam: VS:  BP 110/74   Pulse 79   Ht 6\' 1"  (1.854 m)   Wt 199 lb (90.3 kg)   SpO2 97%   BMI 26.25 kg/m  .  BMI Body mass index is 26.25 kg/m.  Wt Readings from Last 3 Encounters:  10/10/19 199 lb (90.3 kg)  10/01/19 202 lb 13.2 oz (92 kg)  09/25/19 202 lb (91.6 kg)    Affect appropriate Healthy:  appears stated age HEENT: normal Neck supple with no adenopathy JVP normal no bruits no thyromegaly Lungs clear with no wheezing and good diaphragmatic motion Heart:  S1/S2 AR  murmur, no rub, gallop or click PMI normal Abdomen: benighn, BS positve, no tenderness, no AAA no bruit.  No HSM or HJR Distal pulses intact with no bruits No edema Neuro non-focal Skin warm and dry No muscular weakness    LABORATORY DATA:  EKG:  10/01/19 SR rate 56 normal   Lab  Results  Component Value Date   WBC 6.8 09/25/2019   HGB 15.2 09/25/2019   HCT 44.1 09/25/2019   PLT 207 09/25/2019   GLUCOSE 100 (H) 09/25/2019   CHOL 178 08/31/2018   TRIG 167.0 (H) 08/31/2018   HDL 56.80 08/31/2018   LDLDIRECT 137.4 10/15/2010   LDLCALC 88 08/31/2018   ALT 42 07/31/2019   AST 38 (H) 07/31/2019   NA 141 09/25/2019   K 4.0 09/25/2019   CL 107 (H) 09/25/2019   CREATININE 1.31 (H) 09/25/2019   BUN 17 09/25/2019   CO2 22 09/25/2019   TSH 1.98 07/31/2019   PSA 3.14 08/31/2018   INR 1.1 09/25/2019     BNP (last 3 results) No results for input(s): BNP in the last 8760 hours.  ProBNP (last 3 results) No results for input(s): PROBNP in the last 8760 hours.   Other Studies Reviewed Today:  ECHO  IMPRESSIONS 08/2019   1. Left ventricular ejection fraction, by visual estimation, is 55 to 60%. The left ventricle has normal function. Normal left ventricular size. There is mildly increased left ventricular hypertrophy.  2. NA due to afib pattern of LV diastolic filling.  3. Global right ventricle has mildly reduced systolic function.The right ventricular size is normal. No increase in right ventricular wall thickness.  4. Left atrial size was normal.  5. Right atrial size was normal.  6. The mitral valve is normal in structure. Trace mitral valve regurgitation. No evidence of mitral stenosis.  7. The tricuspid valve is normal in structure. Tricuspid valve regurgitation is trivial.  8. The aortic valve is tricuspid Aortic valve regurgitation is mild to moderate by color flow Doppler. Structurally normal aortic valve, with no evidence of sclerosis or stenosis.  9. The pulmonic valve was grossly normal. Pulmonic valve regurgitation is not visualized by color flow Doppler. 10. Aortic dilatation noted. 11. There is moderate dilatation of the ascending aorta measuring 40 mm. 12. Normal pulmonary artery systolic pressure. 13. The inferior vena cava is normal in size with greater than 50% respiratory variability, suggesting right atrial pressure of 3 mmHg.   Notes recorded by Josue Hector, MD on 09/08/2019 at 9:38 AM EDT  EF normal mild to moderate AR f/u echo in a year   Assessment/Plan:  1. Atrial fib - persistent - has normal atrial size - successful [facility] 10/01/19 CHADVASC 0 ERA Start flecainide 75 bid R/O CAD and pro arrhythmia with exercise myovue 10 days Return in 4 weeks to discuss repeat [facility] on AAT Rx   2. Anticoagulation -  No bleeding issues on eliquis    3. 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   4. Dilated aorta - will need following - consider arranging imaging on return. His father died with sudden death - this a presumed MI. Precautions were discussed  (Bp control, no Valsalva, no Fluoroquinolones).     5. Mild to moderate AI - will need following - to have echo in 6 months from time of restoration of NSR   6. COVID-19 Education: The signs and symptoms of COVID-19 were discussed with the patient and how to seek care for testing (follow up with PCP or arrange E-visit).  The importance of social distancing, staying at home, hand hygiene and wearing a mask when out in public were discussed today.  Current medicines are reviewed with the patient today.  The patient does not have concerns regarding medicines other than what has been noted above.  The following changes  have been made:  Flecainide 75 bid  Labs/ tests ordered today include:    Echo 6 months from time of repeat conversion  Ex Myovue 10 days from start of flecainide    Disposition:   FU with me in a month   Patient is agreeable to this plan and will call if any problems develop in the interim.   Signed: Charlton HawsPeter Nishan, MD  10/10/2019 3:27 PM  [facility] [address] [place], [place]  [address] Phone: [phone]) [phone] Fax: [phone]

## 2019-10-10 ENCOUNTER — Encounter: Payer: Self-pay | Admitting: Cardiovascular Disease

## 2019-10-10 ENCOUNTER — Other Ambulatory Visit: Payer: Self-pay

## 2019-10-10 ENCOUNTER — Ambulatory Visit: Payer: BC Managed Care – PPO | Admitting: Cardiovascular Disease

## 2019-10-10 VITALS — BP 110/74 | HR 79 | Ht 73.0 in | Wt 199.0 lb

## 2019-10-10 DIAGNOSIS — I4891 Unspecified atrial fibrillation: Secondary | ICD-10-CM

## 2019-10-10 DIAGNOSIS — I77810 Thoracic aortic ectasia: Secondary | ICD-10-CM | POA: Diagnosis not present

## 2019-10-10 MED ORDER — FLECAINIDE ACETATE 50 MG PO TABS: 75.0000 mg | ORAL_TABLET | Freq: Two times a day (BID) | ORAL | 3 refills | Status: DC

## 2019-10-10 NOTE — Patient Instructions (Addendum)
Medication Instructions:  Your physician has recommended you make the following change in your medication:  1-START Flecainide 75 mg (1 1/2 tablets) by mouth twice daily.  *If you need a refill on your cardiac medications before your next appointment, please call your pharmacy*  Lab Work:  If you have labs (blood work) drawn today and your tests are completely normal, you will receive your results only by: Marland Kitchen MyChart Message (if you have MyChart) OR . A paper copy in the mail If you have any lab test that is abnormal or we need to change your treatment, we will call you to review the results.  Testing/Procedures: Your physician has requested that you have an echocardiogram in 6 months. Echocardiography is a painless test that uses sound waves to create images of your heart. It provides your doctor with information about the size and shape of your heart and how well your heart's chambers and valves are working. This procedure takes approximately one hour. There are no restrictions for this procedure.  Your physician has requested that you have en exercise stress myoview in 10 days. For further information please visit [email or web address]. Please follow instruction sheet, as given.  Follow-Up: At [facility], you and your health needs are our priority.  As part of our continuing mission to provide you with exceptional heart care, we have created designated Provider Care Teams.  These Care Teams include your primary Cardiologist (physician) and Advanced Practice Providers (APPs -  Physician Assistants and Nurse Practitioners) who all work together to provide you with the care you need, when you need it.  Your next appointment:   4 to 5 weeks  The format for your next appointment:   In Person  Provider:   You may see Dr. Johnsie Cancel or one of the following Advanced Practice Providers on your designated Care Team:    Truitt Merle, NP  Cecilie Kicks, NP  Kathyrn Drown, NP

## 2019-10-11 ENCOUNTER — Telehealth: Payer: Self-pay | Admitting: Cardiovascular Disease

## 2019-10-11 NOTE — Telephone Encounter (Signed)
Spoke with pt who states he has only been taking Lexapro prn for anxiety. Discussed that this is not a recommended dosing for an SSRI as well as interacts with his flecainide. Pt states he will stop taking his Lexapro and will follow up with his PCP regarding anxiety if needed. Med list updated.

## 2019-10-11 NOTE — Telephone Encounter (Signed)
[facility] stating that there is an interaction with pt's medications flecainide and escitalopram, QT prolongation with escitalopram, pharmacy wanted to make sure Dr. Johnsie Cancel is aware. Please address

## 2019-10-11 NOTE — Telephone Encounter (Signed)
Would have him talk with primary Needs to be on flecainide Can substitute Celexa for Lexapro and be ok  Sean Blossom do you agree and can you suggest an equivalent dose ??

## 2019-10-15 ENCOUNTER — Encounter (HOSPITAL_COMMUNITY): Payer: Self-pay | Admitting: *Deleted

## 2019-10-15 ENCOUNTER — Telehealth (HOSPITAL_COMMUNITY): Payer: Self-pay | Admitting: *Deleted

## 2019-10-15 NOTE — Telephone Encounter (Signed)
Attempted to leave a message on voicemail in reference to upcoming appointment scheduled for 10/21/2019 but no answer and voicemail full . My chart letter sent with instructions. Sean [name], Sean [name]

## 2019-10-17 ENCOUNTER — Other Ambulatory Visit (HOSPITAL_COMMUNITY)
Admission: RE | Admit: 2019-10-17 | Discharge: 2019-10-17 | Disposition: A | Discharge status: Home / Self Care | Payer: BC Managed Care – PPO | Source: Ambulatory Visit | Attending: Cardiovascular Disease | Admitting: Cardiovascular Disease

## 2019-10-17 DIAGNOSIS — Z01812 Encounter for preprocedural laboratory examination: Secondary | ICD-10-CM | POA: Diagnosis not present

## 2019-10-17 DIAGNOSIS — Z20828 Contact with and (suspected) exposure to other viral communicable diseases: Secondary | ICD-10-CM | POA: Diagnosis not present

## 2019-10-18 LAB — NOVEL CORONAVIRUS, NAA (HOSP ORDER, SEND-OUT TO REF LAB; TAT 18-24 HRS): SARS-CoV-2, NAA: NOT DETECTED

## 2019-10-21 ENCOUNTER — Ambulatory Visit (HOSPITAL_COMMUNITY): Payer: BC Managed Care – PPO | Attending: Cardiovascular Disease

## 2019-10-21 ENCOUNTER — Other Ambulatory Visit: Payer: Self-pay

## 2019-10-21 DIAGNOSIS — I4891 Unspecified atrial fibrillation: Secondary | ICD-10-CM

## 2019-10-21 LAB — MYOCARDIAL PERFUSION IMAGING
Estimated workload: 7 METS
Exercise duration (min): 6 min
Exercise duration (sec): 0 s
LV dias vol: 105 mL (ref 62–150)
LV sys vol: 70 mL
MPHR: 158 {beats}/min
Peak HR: 155 {beats}/min
Percent HR: 98 %
RPE: 18
Rest HR: 106 {beats}/min
SDS: 0
SRS: 0
SSS: 0
TID: 1.09

## 2019-10-21 MED ORDER — TECHNETIUM TC 99M TETROFOSMIN IV KIT
31.9000 | PACK | Freq: Once | INTRAVENOUS | Status: AC | PRN
  Administered 2019-10-21: 31.9 via INTRAVENOUS
  Filled 2019-10-21: qty 32

## 2019-10-21 MED ORDER — TECHNETIUM TC 99M TETROFOSMIN IV KIT
10.6000 | PACK | Freq: Once | INTRAVENOUS | Status: AC | PRN
  Administered 2019-10-21: 10.6 via INTRAVENOUS
  Filled 2019-10-21: qty 11

## 2019-10-29 ENCOUNTER — Other Ambulatory Visit: Payer: Self-pay | Admitting: Family Medicine

## 2019-11-01 ENCOUNTER — Other Ambulatory Visit (HOSPITAL_COMMUNITY): Payer: BC Managed Care – PPO

## 2019-11-06 DIAGNOSIS — Z85828 Personal history of other malignant neoplasm of skin: Secondary | ICD-10-CM | POA: Diagnosis not present

## 2019-11-06 DIAGNOSIS — D225 Melanocytic nevi of trunk: Secondary | ICD-10-CM | POA: Diagnosis not present

## 2019-11-06 DIAGNOSIS — L821 Other seborrheic keratosis: Secondary | ICD-10-CM | POA: Diagnosis not present

## 2019-11-06 DIAGNOSIS — D485 Neoplasm of uncertain behavior of skin: Secondary | ICD-10-CM | POA: Diagnosis not present

## 2019-11-13 NOTE — H&P (View-Only) (Signed)
CARDIOLOGY OFFICE NOTE  Date:  11/18/2019    Sean [name] Date of Birth: 1956/02/13 Medical Record #[id number]  PCP:  Sean Covey, MD  Cardiologist:  Sean [name]    Chief Complaint  Patient presents with  . Follow-up    Seen for Dr. Eden [name]    History of Present Illness: Sean [name] is a 63 y.o. male who presents today for a one month check. Seen for Dr. Eden [name].   He was seen back in September at request of PCP for new onset AF - CHADSVASC of 0. Not symptomatic. Had cut back alcohol use. Dr. Eden [name] discussed rate control versus restoration strategies. Eliquis was started. I arranged for cardioversion which was successful but short lived.   He saw Dr. Eden [name] back last month - was back in AF. Flecainide was started with plans to repeat cardioversion after 4 weeks of therapy. Stress testing was done as well. No ischemia noted - EF was reduced but he was in AF and does not correlate to his recent echo that showed normal EF.   The patient does not have symptoms concerning for COVID-19 infection (fever, chills, cough, or new shortness of breath).   Comes in today. Here alone. He is doing well. Feels like his heart has "calmed" a bit. Tolerating his medicines without issue. No bleeding. No missed doses. Remains in AF.    Past Medical History:  Diagnosis Date  . Atrial fibrillation [facility])     Past Surgical History:  Procedure Laterality Date  . CARDIOVERSION N/A 10/01/2019   Procedure: CARDIOVERSION;  Surgeon: Sean [name], Sean Ping, MD;  Location: [facility] ENDOSCOPY;  Service: Cardiovascular;  Laterality: N/A;  . CATARACT EXTRACTION, BILATERAL    . EYE SURGERY  07/30/2019     Medications: Current Meds  Medication Sig  . apixaban (ELIQUIS) 5 MG TABS tablet Take 1 tablet (5 mg total) by mouth 2 (two) times daily.  . flecainide (TAMBOCOR) 50 MG tablet Take 1.5 tablets (75 mg total) by mouth 2 (two) times daily.  . Multiple Vitamins-Minerals (CENTRUM SILVER ADULT 50+  PO) Take by mouth.  . zolpidem (AMBIEN) 10 MG tablet TAKE 1 TABLET (10 MG TOTAL) BY MOUTH AT BEDTIME AS NEEDED. FOR SLEEP     Allergies: No Known Allergies  Social History: The patient  reports that he has been smoking cigars. He has never used smokeless tobacco. He reports current alcohol use of about 4.0 - 5.0 standard drinks of alcohol per week. He reports that he does not use drugs.   Family History: The patient's family history is not on file.   Review of Systems: Please see the history of present illness.   All other systems are reviewed and negative.   Physical Exam: VS:  BP 118/82   Pulse (!) 115   Ht 6\' 1"  (1.854 m)   Wt 199 lb 1.9 oz (90.3 kg)   SpO2 97%   BMI 26.27 kg/m  .  BMI Body mass index is 26.27 kg/m.  Wt Readings from Last 3 Encounters:  11/18/19 199 lb 1.9 oz (90.3 kg)  10/21/19 199 lb (90.3 kg)  10/10/19 199 lb (90.3 kg)    General: Pleasant. Alert and in no acute distress.   HEENT: Normal.  Neck: Supple, no JVD, carotid bruits, or masses noted.  Cardiac: Irregular irregular rhythm. His rate is ok.  Respiratory:  Lungs are clear to auscultation bilaterally with normal work of breathing.  GI: Soft and nontender.  MS: No  deformity or atrophy. Gait and ROM intact.  Skin: Warm and dry. Color is normal.  Neuro:  Strength and sensation are intact and no gross focal deficits noted.  Psych: Alert, appropriate and with normal affect.   LABORATORY DATA:  EKG:  EKG is ordered today. This demonstrates AF/flutter with controlled rate of 87.  Lab Results  Component Value Date   WBC 6.8 09/25/2019   HGB 15.2 09/25/2019   HCT 44.1 09/25/2019   PLT 207 09/25/2019   GLUCOSE 100 (H) 09/25/2019   CHOL 178 08/31/2018   TRIG 167.0 (H) 08/31/2018   HDL 56.80 08/31/2018   LDLDIRECT 137.4 10/15/2010   LDLCALC 88 08/31/2018   ALT 42 07/31/2019   AST 38 (H) 07/31/2019   NA 141 09/25/2019   K 4.0 09/25/2019   CL 107 (H) 09/25/2019   CREATININE 1.31 (H)  09/25/2019   BUN 17 09/25/2019   CO2 22 09/25/2019   TSH 1.98 07/31/2019   PSA 3.14 08/31/2018   INR 1.1 09/25/2019     BNP (last 3 results) No results for input(s): BNP in the last 8760 hours.  ProBNP (last 3 results) No results for input(s): PROBNP in the last 8760 hours.   Other Studies Reviewed Today:  Myoview Study Highlights 09/2019   The patient's resting heart rate was 106.  The patient walked on a standard Bruce protocol treadmill test for 6 minutes. He achieved a peak heart rate of 158 which is 98% predicted maximal heart rate.  At peak exercise there were no ST or T wave changes. There is no significant QRS widening with exercise.  Nuclear stress EF: 33%. The left ventricular ejection fraction is moderately decreased (30-44%).  This is a high risk study based on reduction of LV function. There is no evidence of ischemia or previous infarction     Sean Hector, MD  10/21/2019 4:18 PM EST    No ischemia or infarct no interval issues with flecainide Would have commented that EF not accurate as patient was in afib EF normal by echo 55-60%     ECHO IMPRESSIONS 08/2019  1. Left ventricular ejection fraction, by visual estimation, is 55 to 60%. The left ventricle has normal function. Normal left ventricular size. There is mildly increased left ventricular hypertrophy. 2. NA due to afib pattern of LV diastolic filling. 3. Global right ventricle has mildly reduced systolic function.The right ventricular size is normal. No increase in right ventricular wall thickness. 4. Left atrial size was normal. 5. Right atrial size was normal. 6. The mitral valve is normal in structure. Trace mitral valve regurgitation. No evidence of mitral stenosis. 7. The tricuspid valve is normal in structure. Tricuspid valve regurgitation is trivial. 8. The aortic valve is tricuspid Aortic valve regurgitation is mild to moderate by color flow Doppler. Structurally normal  aortic valve, with no evidence of sclerosis or stenosis. 9. The pulmonic valve was grossly normal. Pulmonic valve regurgitation is not visualized by color flow Doppler. 10. Aortic dilatation noted. 11. There is moderate dilatation of the ascending aorta measuring 40 mm. 12. Normal pulmonary artery systolic pressure. 13. The inferior vena cava is normal in size with greater than 50% respiratory variability, suggesting right atrial pressure of 3 mmHg.   Notes recorded by Sean Hector, MD on 09/08/2019 at 9:38 AM EDT  EF normal mild to moderate AR f/u echo in a year   Assessment/Plan:  1. PAF - now persistent - he had short lived results with cardioversion last month -  now on Flecainide - has had stress Myoview - EF was low but normal by prior echo and patient was in AF during that study - no ischemia and no pro-arrhythmia. CHADSVASC is 0. Will arrange repeat cardioversion - he does not wish to do until after the holidays and with limited interruption in his work schedule. Arranged for Monday, January 18th at 9am. He will have labs and COVID testing on Friday, January 15th. Procedure has been reviewed with him and he is willing to proceed.   2. Chronic anticoagulation - no problems noted. No missed doses.   3. 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   4. Dilated aorta - will need following - consider arranging imaging on return. His father died with sudden death - this was a presumed MI. Precautions were previously discussed (Bp control, no Valsalva, no Fluoroquinolones).     5. Mild to moderate AI - will need following - to have echo in 6 months from time of restoration back to NSR   6. COVID-19 Education: The signs and symptoms of COVID-19 were discussed with the patient and how to seek care for testing (follow up with PCP or arrange E-visit).  The importance of social distancing, staying at home, hand hygiene and wearing a mask when out in public were discussed  today.  Current medicines are reviewed with the patient today.  The patient does not have concerns regarding medicines other than what has been noted above.  The following changes have been made:  See above.  Labs/ tests ordered today include:    Orders Placed This Encounter  Procedures  . Basic metabolic panel  . CBC no Diff  . PT and PTT  . HEART 12-Lead     Disposition:   FU with us in about 5 to 6 weeks post cardioversion.   Patient is agreeable to this plan and will call if any problems develop in the interim.   Signed: Mandeep Kiser, NP  11/18/2019 4:19 PM  [facility] [address] [place], [place]  [address] Phone: [phone] Fax: [phone]        

## 2019-11-13 NOTE — Progress Notes (Signed)
CARDIOLOGY OFFICE NOTE  Date:  11/18/2019    Sean [name] Date of Birth: 1956/02/13 Medical Record #[id number]  PCP:  Kristian Covey, MD  Cardiologist:  Townsend Roger    Chief Complaint  Patient presents with  . Follow-up    Seen for Dr. Eden Emms    History of Present Illness: Sean [name] is a 63 y.o. male who presents today for a one month check. Seen for Dr. Eden Emms.   He was seen back in September at request of PCP for new onset AF - CHADSVASC of 0. Not symptomatic. Had cut back alcohol use. Dr. Eden Emms discussed rate control versus restoration strategies. Eliquis was started. I arranged for cardioversion which was successful but short lived.   He saw Dr. Eden Emms back last month - was back in AF. Flecainide was started with plans to repeat cardioversion after 4 weeks of therapy. Stress testing was done as well. No ischemia noted - EF was reduced but he was in AF and does not correlate to his recent echo that showed normal EF.   The patient does not have symptoms concerning for COVID-19 infection (fever, chills, cough, or new shortness of breath).   Comes in today. Here alone. He is doing well. Feels like his heart has "calmed" a bit. Tolerating his medicines without issue. No bleeding. No missed doses. Remains in AF.    Past Medical History:  Diagnosis Date  . Atrial fibrillation [facility])     Past Surgical History:  Procedure Laterality Date  . CARDIOVERSION N/A 10/01/2019   Procedure: CARDIOVERSION;  Surgeon: Elease Hashimoto, Deloris Ping, MD;  Location: [facility] ENDOSCOPY;  Service: Cardiovascular;  Laterality: N/A;  . CATARACT EXTRACTION, BILATERAL    . EYE SURGERY  07/30/2019     Medications: Current Meds  Medication Sig  . apixaban (ELIQUIS) 5 MG TABS tablet Take 1 tablet (5 mg total) by mouth 2 (two) times daily.  . flecainide (TAMBOCOR) 50 MG tablet Take 1.5 tablets (75 mg total) by mouth 2 (two) times daily.  . Multiple Vitamins-Minerals (CENTRUM SILVER ADULT 50+  PO) Take by mouth.  . zolpidem (AMBIEN) 10 MG tablet TAKE 1 TABLET (10 MG TOTAL) BY MOUTH AT BEDTIME AS NEEDED. FOR SLEEP     Allergies: No Known Allergies  Social History: The patient  reports that he has been smoking cigars. He has never used smokeless tobacco. He reports current alcohol use of about 4.0 - 5.0 standard drinks of alcohol per week. He reports that he does not use drugs.   Family History: The patient's family history is not on file.   Review of Systems: Please see the history of present illness.   All other systems are reviewed and negative.   Physical Exam: VS:  BP 118/82   Pulse (!) 115   Ht 6\' 1"  (1.854 m)   Wt 199 lb 1.9 oz (90.3 kg)   SpO2 97%   BMI 26.27 kg/m  .  BMI Body mass index is 26.27 kg/m.  Wt Readings from Last 3 Encounters:  11/18/19 199 lb 1.9 oz (90.3 kg)  10/21/19 199 lb (90.3 kg)  10/10/19 199 lb (90.3 kg)    General: Pleasant. Alert and in no acute distress.   HEENT: Normal.  Neck: Supple, no JVD, carotid bruits, or masses noted.  Cardiac: Irregular irregular rhythm. His rate is ok.  Respiratory:  Lungs are clear to auscultation bilaterally with normal work of breathing.  GI: Soft and nontender.  MS: No  deformity or atrophy. Gait and ROM intact.  Skin: Warm and dry. Color is normal.  Neuro:  Strength and sensation are intact and no gross focal deficits noted.  Psych: Alert, appropriate and with normal affect.   LABORATORY DATA:  EKG:  EKG is ordered today. This demonstrates AF/flutter with controlled rate of 87.  Lab Results  Component Value Date   WBC 6.8 09/25/2019   HGB 15.2 09/25/2019   HCT 44.1 09/25/2019   PLT 207 09/25/2019   GLUCOSE 100 (H) 09/25/2019   CHOL 178 08/31/2018   TRIG 167.0 (H) 08/31/2018   HDL 56.80 08/31/2018   LDLDIRECT 137.4 10/15/2010   LDLCALC 88 08/31/2018   ALT 42 07/31/2019   AST 38 (H) 07/31/2019   NA 141 09/25/2019   K 4.0 09/25/2019   CL 107 (H) 09/25/2019   CREATININE 1.31 (H)  09/25/2019   BUN 17 09/25/2019   CO2 22 09/25/2019   TSH 1.98 07/31/2019   PSA 3.14 08/31/2018   INR 1.1 09/25/2019     BNP (last 3 results) No results for input(s): BNP in the last 8760 hours.  ProBNP (last 3 results) No results for input(s): PROBNP in the last 8760 hours.   Other Studies Reviewed Today:  Myoview Study Highlights 09/2019   The patient's resting heart rate was 106.  The patient walked on a standard Bruce protocol treadmill test for 6 minutes. He achieved a peak heart rate of 158 which is 98% predicted maximal heart rate.  At peak exercise there were no ST or T wave changes. There is no significant QRS widening with exercise.  Nuclear stress EF: 33%. The left ventricular ejection fraction is moderately decreased (30-44%).  This is a high risk study based on reduction of LV function. There is no evidence of ischemia or previous infarction     Josue Hector, MD  10/21/2019 4:18 PM EST    No ischemia or infarct no interval issues with flecainide Would have commented that EF not accurate as patient was in afib EF normal by echo 55-60%     ECHO IMPRESSIONS 08/2019  1. Left ventricular ejection fraction, by visual estimation, is 55 to 60%. The left ventricle has normal function. Normal left ventricular size. There is mildly increased left ventricular hypertrophy. 2. NA due to afib pattern of LV diastolic filling. 3. Global right ventricle has mildly reduced systolic function.The right ventricular size is normal. No increase in right ventricular wall thickness. 4. Left atrial size was normal. 5. Right atrial size was normal. 6. The mitral valve is normal in structure. Trace mitral valve regurgitation. No evidence of mitral stenosis. 7. The tricuspid valve is normal in structure. Tricuspid valve regurgitation is trivial. 8. The aortic valve is tricuspid Aortic valve regurgitation is mild to moderate by color flow Doppler. Structurally normal  aortic valve, with no evidence of sclerosis or stenosis. 9. The pulmonic valve was grossly normal. Pulmonic valve regurgitation is not visualized by color flow Doppler. 10. Aortic dilatation noted. 11. There is moderate dilatation of the ascending aorta measuring 40 mm. 12. Normal pulmonary artery systolic pressure. 13. The inferior vena cava is normal in size with greater than 50% respiratory variability, suggesting right atrial pressure of 3 mmHg.   Notes recorded by Josue Hector, MD on 09/08/2019 at 9:38 AM EDT  EF normal mild to moderate AR f/u echo in a year   Assessment/Plan:  1. PAF - now persistent - he had short lived results with cardioversion last month -  now on Flecainide - has had stress Myoview - EF was low but normal by prior echo and patient was in AF during that study - no ischemia and no pro-arrhythmia. CHADSVASC is 0. Will arrange repeat cardioversion - he does not wish to do until after the holidays and with limited interruption in his work schedule. Ar[facility] for [facility], [facility]. He will have labs and COVID testing on Friday, January 15th. Procedure has been reviewed with him and he is willing to proceed.   2. Chronic anticoagulation - no problems noted. No missed doses.   3. 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   4. Dilated aorta - will need following - consider arranging imaging on return. His father died with sudden death - this was a presumed MI. Precautions were previously discussed (Bp control, no Valsalva, no Fluoroquinolones).     5. Mild to moderate AI - will need following - to have echo in 6 months from time of restoration back to NSR   6. COVID-19 Education: The signs and symptoms of COVID-19 were discussed with the patient and how to seek care for testing (follow up with PCP or arrange E-visit).  The importance of social distancing, staying at home, hand hygiene and wearing a mask when out in public were discussed  today.  Current medicines are reviewed with the patient today.  The patient does not have concerns regarding medicines other than what has been noted above.  The following changes have been made:  See above.  Labs/ tests ordered today include:    Orders Placed This Encounter  Procedures  . Basic metabolic panel  . CBC no Diff  . PT and PTT  . HEART 12-Lead     Disposition:   FU with us in about 5 to 6 weeks post cardioversion.   Patient is agreeable to this plan and will call if any problems develop in the interim.   SignedNorma Fredrickson: Yamilee Harmes, NP  11/18/2019 4:19 PM  [facility] [address] [place], [place]  [id number] Phone: [phone](336) [phone] Fax: [phone]

## 2019-11-18 ENCOUNTER — Encounter: Payer: Self-pay | Admitting: Nurse Practitioner

## 2019-11-18 ENCOUNTER — Ambulatory Visit: Payer: BC Managed Care – PPO | Admitting: Nurse Practitioner

## 2019-11-18 ENCOUNTER — Other Ambulatory Visit: Payer: Self-pay

## 2019-11-18 VITALS — BP 118/82 | HR 115 | Ht 73.0 in | Wt 199.1 lb

## 2019-11-18 DIAGNOSIS — I480 Paroxysmal atrial fibrillation: Secondary | ICD-10-CM

## 2019-11-18 DIAGNOSIS — I4819 Other persistent atrial fibrillation: Secondary | ICD-10-CM

## 2019-11-18 DIAGNOSIS — Z7189 Other specified counseling: Secondary | ICD-10-CM

## 2019-11-18 DIAGNOSIS — I77819 Aortic ectasia, unspecified site: Secondary | ICD-10-CM

## 2019-11-18 DIAGNOSIS — Z79899 Other long term (current) drug therapy: Secondary | ICD-10-CM | POA: Diagnosis not present

## 2019-11-18 DIAGNOSIS — I77810 Thoracic aortic ectasia: Secondary | ICD-10-CM

## 2019-11-18 DIAGNOSIS — Z7901 Long term (current) use of anticoagulants: Secondary | ICD-10-CM

## 2019-11-18 NOTE — Patient Instructions (Addendum)
After Visit Summary:  We will be checking the following labs today - NONE  Lab on Friday morning January 15th BMET, CBC, PT & PTT   Medication Instructions:    Continue with your current medicines.    If you need a refill on your cardiac medications before your next appointment, please call your pharmacy.     Testing/Procedures To Be Arranged:  Cardioversion  Follow-Up:   See me or Dr. Johnsie Cancel towards the end of January with repeat EKG    At [facility], you and your health needs are our priority.  As part of our continuing mission to provide you with exceptional heart care, we have created designated Provider Care Teams.  These Care Teams include your primary Cardiologist (physician) and Advanced Practice Providers (APPs -  Physician Assistants and Nurse Practitioners) who all work together to provide you with the care you need, when you need it.  Special Instructions:  . Stay safe, stay home, wash your hands for at least 20 seconds and wear a mask when out in public.  . It was good to talk with you today.    Your provider has recommended a cardioversion.   COVID SCREENING INFORMATION: You are scheduled for your COVID screening on Friday, January 15th  at _9:25am_. [facility] ([facility]) [address] Stay in the RIGHT lane and proceed under the brick awning (NOT the tent) and tell them you are there for pre-procedure testing Do NOT bring any pets with you to the testing site You will need to quarantine following your COVID test until you present to the hospital for your procedure   You are scheduled for a cardioversion on Monday, January 18th at [facility] with Dr. Alba Cory or associates. Please go to [facility] ([address] 2nd [place] Stay at Monday, January 18th at 7:30AM.  There is free valet parking available.  Enter through the [place] not have any food or drink after midnight on Sunday  You may  take your medicines with a sip of water on the day of your procedure.   Hold the following medicines - NONE   DO NOT STOP YOUR ANTICOAGULANT (BLOOD THINNER)  You will need someone to drive you home following your procedure and stay in the waiting room during your procedure. Failure to do so could result in your procedure being cancelled.   Every effort is made to have your procedure done on time. Occasionally there are emergencies that occur at the hospital that may cause delays.   Call the [place] office at [phone] if you have any questions, problems or concerns.     Electrical Cardioversion Electrical cardioversion is the delivery of a jolt of electricity to change the rhythm of the heart. Sticky patches or metal paddles are placed on the chest to deliver the electricity from a device. This is done to restore a normal rhythm. A rhythm that is too fast or not regular keeps the heart from pumping well. Electrical cardioversion is done in an emergency if:   There is low or no blood pressure as a result of the heart rhythm.    Normal rhythm must be restored as fast as possible to protect the brain and heart from further damage.    It may save a life. Cardioversion may be done for heart rhythms that are not immediately life threatening, such as atrial fibrillation or flutter, in which:  The heart is beating too fast or is not regular.    Medicine to change the rhythm has not worked.    It is safe to wait in order to allow time for preparation.  Symptoms of the abnormal rhythm are bothersome.  The risk of stroke and other serious problems can be reduced.  LET [facility] CARE PROVIDER KNOW ABOUT:   Any allergies you have.  All medicines you are taking, including vitamins, herbs, eye drops, creams, and over-the-counter medicines.  Previous problems you or members of your family have had with the use of anesthetics.    Any blood disorders you  have.    Previous surgeries you have had.    Medical conditions you have.   RISKS AND COMPLICATIONS  Generally, this is a safe procedure. However, problems can occur and include:   Breathing problems related to the anesthetic used.  A blood clot that breaks free and travels to other parts of your body. This could cause a stroke or other problems. The risk of this is lowered by use of blood-thinning medicine (anticoagulant) prior to the procedure.  Cardiac arrest (rare).   BEFORE THE PROCEDURE   You may have tests to detect blood clots in your heart and to evaluate heart function.   You may start taking anticoagulants so your blood does not clot as easily.    Medicines may be given to help stabilize your heart rate and rhythm.   PROCEDURE  You will be given medicine through an IV tube to reduce discomfort and make you sleepy (sedative).    An electrical shock will be delivered.   AFTER THE PROCEDURE Your heart rhythm will be watched to make sure it does not change. You will need someone to drive you home.      Call the [facility] office at [phone] if you have any questions, problems or concerns.

## 2019-12-13 ENCOUNTER — Other Ambulatory Visit (HOSPITAL_COMMUNITY)
Admission: RE | Admit: 2019-12-13 | Discharge: 2019-12-13 | Disposition: A | Discharge status: Home / Self Care | Payer: BC Managed Care – PPO | Source: Ambulatory Visit | Attending: Internal Medicine | Admitting: Internal Medicine

## 2019-12-13 DIAGNOSIS — Z01812 Encounter for preprocedural laboratory examination: Secondary | ICD-10-CM | POA: Diagnosis not present

## 2019-12-13 DIAGNOSIS — Z20822 Contact with and (suspected) exposure to covid-19: Secondary | ICD-10-CM | POA: Insufficient documentation

## 2019-12-13 LAB — SARS CORONAVIRUS 2 (TAT 6-24 HRS): SARS Coronavirus 2: NEGATIVE

## 2019-12-16 ENCOUNTER — Encounter (HOSPITAL_COMMUNITY)
Admission: RE | Disposition: A | Discharge status: Home / Self Care | Payer: Self-pay | Source: Home / Self Care | Attending: Internal Medicine

## 2019-12-16 ENCOUNTER — Ambulatory Visit (HOSPITAL_COMMUNITY)
Admission: RE | Admit: 2019-12-16 | Discharge: 2019-12-16 | Disposition: A | Discharge status: Home / Self Care | Payer: BC Managed Care – PPO | Attending: Internal Medicine | Admitting: Internal Medicine

## 2019-12-16 ENCOUNTER — Ambulatory Visit (HOSPITAL_COMMUNITY): Payer: BC Managed Care – PPO | Admitting: Anesthesiology

## 2019-12-16 ENCOUNTER — Other Ambulatory Visit: Payer: Self-pay

## 2019-12-16 DIAGNOSIS — I4819 Other persistent atrial fibrillation: Secondary | ICD-10-CM | POA: Diagnosis not present

## 2019-12-16 DIAGNOSIS — E785 Hyperlipidemia, unspecified: Secondary | ICD-10-CM | POA: Diagnosis not present

## 2019-12-16 DIAGNOSIS — Z7901 Long term (current) use of anticoagulants: Secondary | ICD-10-CM | POA: Diagnosis not present

## 2019-12-16 DIAGNOSIS — Z79899 Other long term (current) drug therapy: Secondary | ICD-10-CM | POA: Diagnosis not present

## 2019-12-16 DIAGNOSIS — I480 Paroxysmal atrial fibrillation: Secondary | ICD-10-CM | POA: Diagnosis not present

## 2019-12-16 DIAGNOSIS — F17290 Nicotine dependence, other tobacco product, uncomplicated: Secondary | ICD-10-CM | POA: Insufficient documentation

## 2019-12-16 DIAGNOSIS — I509 Heart failure, unspecified: Secondary | ICD-10-CM | POA: Diagnosis not present

## 2019-12-16 DIAGNOSIS — J309 Allergic rhinitis, unspecified: Secondary | ICD-10-CM | POA: Diagnosis not present

## 2019-12-16 DIAGNOSIS — I4891 Unspecified atrial fibrillation: Secondary | ICD-10-CM | POA: Diagnosis not present

## 2019-12-16 HISTORY — PX: CARDIOVERSION: SHX1299

## 2019-12-16 LAB — POCT I-STAT, CHEM 8
BUN: 21 mg/dL (ref 8–23)
Calcium, Ion: 1.16 mmol/L (ref 1.15–1.40)
Chloride: 108 mmol/L (ref 98–111)
Creatinine, Ser: 1.1 mg/dL (ref 0.61–1.24)
Glucose, Bld: 105 mg/dL — ABNORMAL HIGH (ref 70–99)
HCT: 45 % (ref 39.0–52.0)
Hemoglobin: 15.3 g/dL (ref 13.0–17.0)
Potassium: 4 mmol/L (ref 3.5–5.1)
Sodium: 139 mmol/L (ref 135–145)
TCO2: 23 mmol/L (ref 22–32)

## 2019-12-16 SURGERY — CARDIOVERSION
Anesthesia: General

## 2019-12-16 MED ORDER — LIDOCAINE 2% (20 MG/ML) 5 ML SYRINGE
INTRAMUSCULAR | Status: DC | PRN
  Administered 2019-12-16: 60 mg via INTRAVENOUS

## 2019-12-16 MED ORDER — SODIUM CHLORIDE 0.9 % IV SOLN: INTRAVENOUS | Status: DC

## 2019-12-16 MED ORDER — PROPOFOL 10 MG/ML IV BOLUS
INTRAVENOUS | Status: DC | PRN
  Administered 2019-12-16: 70 mg via INTRAVENOUS

## 2019-12-16 NOTE — Transfer of Care (Signed)
Immediate Anesthesia Transfer of Care Note  Patient: Sean [name]  Procedure(s) Performed: CARDIOVERSION (N/A )  Patient Location: Endoscopy Unit  Anesthesia Type:General  Level of Consciousness: drowsy  Airway & Oxygen Therapy: Patient Spontanous Breathing  Post-op Assessment: Report given to RN and Post -op Vital signs reviewed and stable  Post vital signs: Reviewed and stable  Last Vitals:  Vitals Value Taken Time  BP    Temp    Pulse    Resp    SpO2      Last Pain:  Vitals:   12/16/19 0819  TempSrc: Temporal  PainSc: 0-No pain         Complications: No apparent anesthesia complications

## 2019-12-16 NOTE — Anesthesia Procedure Notes (Signed)
Procedure Name: General with mask airway Performed by: Samara Deist, CRNA Pre-anesthesia Checklist: Suction available, Patient identified, Emergency Drugs available, Patient being monitored and Timeout performed Patient Re-evaluated:Patient Re-evaluated prior to induction Oxygen Delivery Method: Ambu bag Preoxygenation: Pre-oxygenation with 100% oxygen Induction Type: IV induction Placement Confirmation: positive ETCO2 Dental Injury: Teeth and Oropharynx as per pre-operative assessment

## 2019-12-16 NOTE — Anesthesia Preprocedure Evaluation (Addendum)
Anesthesia Evaluation  Patient identified by MRN, date of birth, ID band Patient awake    Reviewed: Allergy & Precautions, NPO status , Patient's Chart, lab work & pertinent test results  History of Anesthesia Complications Negative for: history of anesthetic complications  Airway Mallampati: II  TM Distance: >3 FB Neck ROM: Full    Dental  (+) Dental Advisory Given, Teeth Intact   Pulmonary Current Smoker,    Pulmonary exam normal        Cardiovascular +CHF  + dysrhythmias Atrial Fibrillation + Valvular Problems/Murmurs AI  Rhythm:Irregular Rate:Normal   '20 Myoperfusion -  The patient's resting heart rate was 106. The patient walked on a standard Bruce protocol treadmill test for 6 minutes. He achieved a peak heart rate of 158 which is 98% predicted maximal heart rate. At peak exercise there were no ST or T wave changes. There is no significant QRS widening with exercise. Nuclear stress EF: 33%. The left ventricular ejection fraction is moderately decreased (30-44%). This is a high risk study based on reduction of LV function. There is no evidence of ischemia or previous infarction  '20 TTE (prior to above) - EF 55 to 60%. Mildly increased left ventricular hypertrophy. Global right ventricle has mildly reduced systolic function. Trace MR and TR. 8. Mild to moderate AI. Moderate dilatation of the ascending aorta measuring 40 mm.    Neuro/Psych negative neurological ROS  negative psych ROS   GI/Hepatic negative GI ROS, Neg liver ROS,   Endo/Other  negative endocrine ROS  Renal/GU negative Renal ROS     Musculoskeletal negative musculoskeletal ROS (+)   Abdominal   Peds  Hematology negative hematology ROS (+)   Anesthesia Other Findings Covid neg 1/15   Reproductive/Obstetrics                            Anesthesia Physical Anesthesia Plan  ASA: III  Anesthesia Plan: General    Post-op Pain Management:    Induction: Intravenous  PONV Risk Score and Plan: 1 and Treatment may vary due to age or medical condition and Propofol infusion  Airway Management Planned: Mask and Natural Airway  Additional Equipment: None  Intra-op Plan:   Post-operative Plan:   Informed Consent: I have reviewed the patients History and Physical, chart, labs and discussed the procedure including the risks, benefits and alternatives for the proposed anesthesia with the patient or authorized representative who has indicated his/her understanding and acceptance.       Plan Discussed with: CRNA and Anesthesiologist  Anesthesia Plan Comments:        Anesthesia Quick Evaluation

## 2019-12-16 NOTE — CV Procedure (Signed)
Procedure: Electrical Cardioversion Indications:  Atrial Fibrillation  Procedure Details:  Consent: Risks of procedure as well as the alternatives and risks of each were explained to the (patient/caregiver).  Consent for procedure obtained.  Time Out: Verified patient identification, verified procedure, site/side was marked, verified correct patient position, special equipment/implants available, medications/allergies/relevent history reviewed, required imaging and test results available. PERFORMED.  Patient placed on cardiac monitor, pulse oximetry, supplemental oxygen as necessary.  Sedation given: propofol and lidocaine per anesthesia Pacer pads placed anterior and posterior chest.  Cardioverted 1 time(s).  Cardioversion with synchronized biphasic 120J shock.  Evaluation: Findings: Post procedure EKG shows: NSR Complications: None Patient did tolerate procedure well.  Time Spent Directly with the Patient:  30 minutes   [name] 12/16/2019, 8:54 AM

## 2019-12-16 NOTE — Discharge Instructions (Signed)
Electrical Cardioversion  Follow these instructions at home:  Do not drive for 24 hours if you were given a sedative during your procedure.  Take over-the-counter and prescription medicines only as told by your health care provider.  Ask your health care provider how to check your pulse. Check it often.  Rest for 48 hours after the procedure or as told by your health care provider.  Avoid or limit your caffeine use as told by your health care provider.  Keep all follow-up visits as told by your health care provider. This is important. Contact a health care provider if:  You feel like your heart is beating too quickly or your pulse is not regular.  You have a serious muscle cramp that does not go away. Get help right away if:  You have discomfort in your chest.  You are dizzy or you feel faint.  You have trouble breathing or you are short of breath.  Your speech is slurred.  You have trouble moving an arm or leg on one side of your body.  Your fingers or toes turn cold or blue. Summary  Electrical cardioversion is the delivery of a jolt of electricity to restore a normal rhythm to the heart.  This procedure may be done right away in an emergency or may be a scheduled procedure if the condition is not an emergency.  Generally, this is a safe procedure.  After the procedure, check your pulse often as told by your health care provider. This information is not intended to replace advice given to you by your health care provider. Make sure you discuss any questions you have with your health care provider. Document Revised: 06/17/2019 Document Reviewed: 06/17/2019 Elsevier Patient Education  2020 Elsevier Inc.  

## 2019-12-16 NOTE — Anesthesia Postprocedure Evaluation (Signed)
Anesthesia Post Note  Patient: Sean [name]  Procedure(s) Performed: CARDIOVERSION (N/A )     Patient location during evaluation: PACU Anesthesia Type: General Level of consciousness: awake and alert Pain management: pain level controlled Vital Signs Assessment: post-procedure vital signs reviewed and stable Respiratory status: spontaneous breathing, nonlabored ventilation and respiratory function stable Cardiovascular status: blood pressure returned to baseline and stable Postop Assessment: no apparent nausea or vomiting Anesthetic complications: no    Last Vitals:  Vitals:   12/16/19 0915 12/16/19 0928  BP: (!) 88/69 109/82  Pulse: (!) 52   Resp: 18 17  Temp:    SpO2: 99% 100%    Last Pain:  Vitals:   12/16/19 0928  TempSrc:   PainSc: 0-No pain                 Beryle Lathe

## 2019-12-16 NOTE — Interval H&P Note (Signed)
History and Physical Interval Note:  12/16/2019 8:42 AM  Sean [name]  has presented today for surgery, with the diagnosis of AFIB.  The various methods of treatment have been discussed with the patient and family. After consideration of risks, benefits and other options for treatment, the patient has consented to  Procedure(s): CARDIOVERSION (N/A) as a surgical intervention.  The patient's history has been reviewed, patient examined, no change in status, stable for surgery.  I have reviewed the patient's chart and labs.  Questions were answered to the patient's satisfaction.     [name]

## 2019-12-23 NOTE — Progress Notes (Signed)
CARDIOLOGY OFFICE NOTE  Date:  12/27/2019    [name] Date of Birth: 1956-08-06 Medical Record #[id number]  PCP:  Kristian Covey, MD  Cardiologist:  Francina Ames chief complaint on file.   History of Present Illness: Sean [name] is a 64 y.o. male first seen 08/28/19 for new onset PAF   CHADSVASC of 0. Not symptomatic. Has cut back alcohol use. Successful [facility] 10/01/19 with relapse And repeat [facility] on flecainide 12/16/19 Issues with lexapro she was taking PRN stopped due to interaction With flecainide   Echo 09/06/19  EF 55-60% normal atrial size Mild/Moderate AR Trace MR  Myovue 10/21/19 non ischemia EF not accurate due to afib and normal by echo   He is still able to ride bike 1.5 hours on [address] He has cut back to one glass of red wine nightly   Back in afib/flutter again today rate 92 Also with bad headache for past 5 days that Tylenol has not helped with  Discussed need for ablation with EP referral Also given DOAC will get head CT to r/o SDH  The patient does not have symptoms concerning for COVID-19 infection (fever, chills, cough, or new shortness of breath).     Past Medical History:  Diagnosis Date  . Atrial fibrillation [facility])     Past Surgical History:  Procedure Laterality Date  . CARDIOVERSION N/A 10/01/2019   Procedure: CARDIOVERSION;  Surgeon: Elease Hashimoto Deloris Ping, MD;  Location: [facility] ENDOSCOPY;  Service: Cardiovascular;  Laterality: N/A;  . CARDIOVERSION N/A 12/16/2019   Procedure: CARDIOVERSION;  Surgeon: [name], MD;  Location: [facility] ENDOSCOPY;  Service: Cardiovascular;  Laterality: N/A;  . CATARACT EXTRACTION, BILATERAL    . EYE SURGERY  07/30/2019     Medications: Current Meds  Medication Sig  . apixaban (ELIQUIS) 5 MG TABS tablet Take 1 tablet (5 mg total) by mouth 2 (two) times daily.  . flecainide (TAMBOCOR) 50 MG tablet Take 1.5 tablets (75 mg total) by mouth 2 (two) times daily.  . Multiple Vitamins-Minerals (CENTRUM  SILVER ADULT 50+ PO) Take 1 tablet by mouth daily.   Marland Kitchen zolpidem (AMBIEN) 10 MG tablet TAKE 1 TABLET (10 MG TOTAL) BY MOUTH AT BEDTIME AS NEEDED. FOR SLEEP (Patient taking differently: Take 10 mg by mouth at bedtime as needed for sleep. )     Allergies: No Known Allergies  Social History: The patient  reports that he has been smoking cigars. He has never used smokeless tobacco. He reports current alcohol use of about 4.0 - 5.0 standard drinks of alcohol per week. He reports that he does not use drugs.   Family History: The patient's family history is not on file. His father died at 21 - had sudden death - no autopsy. Mother died at 45 with colon cancer.   Review of Systems: Please see the history of present illness.   All other systems are reviewed and negative.   Physical Exam: VS:  BP 130/90   Pulse 92   Ht 6\' 1"  (1.854 m)   Wt 200 lb 3.2 oz (90.8 kg)   SpO2 97%   BMI 26.41 kg/m  .  BMI Body mass index is 26.41 kg/m.  Wt Readings from Last 3 Encounters:  12/27/19 200 lb 3.2 oz (90.8 kg)  12/16/19 200 lb (90.7 kg)  11/18/19 199 lb 1.9 oz (90.3 kg)    Affect appropriate Healthy:  appears stated age HEENT: normal Neck supple with no adenopathy JVP normal no  bruits no thyromegaly Lungs clear with no wheezing and good diaphragmatic motion Heart:  S1/S2 AR  murmur, no rub, gallop or click PMI normal Abdomen: benighn, BS positve, no tenderness, no AAA no bruit.  No HSM or HJR Distal pulses intact with no bruits No edema Neuro non-focal Skin warm and dry No muscular weakness    LABORATORY DATA:  EKG:  12/24/19 afibr/flutter rate 92 otherwise normal   Lab Results  Component Value Date   WBC 6.8 09/25/2019   HGB 15.3 12/16/2019   HCT 45.0 12/16/2019   PLT 207 09/25/2019   GLUCOSE 105 (H) 12/16/2019   CHOL 178 08/31/2018   TRIG 167.0 (H) 08/31/2018   HDL 56.80 08/31/2018   LDLDIRECT 137.4 10/15/2010   LDLCALC 88 08/31/2018   ALT 42 07/31/2019   AST 38 (H)  07/31/2019   NA 139 12/16/2019   K 4.0 12/16/2019   CL 108 12/16/2019   CREATININE 1.10 12/16/2019   BUN 21 12/16/2019   CO2 22 09/25/2019   TSH 1.98 07/31/2019   PSA 3.14 08/31/2018   INR 1.1 09/25/2019     BNP (last 3 results) No results for input(s): BNP in the last 8760 hours.  ProBNP (last 3 results) No results for input(s): PROBNP in the last 8760 hours.   Other Studies Reviewed Today:  ECHO IMPRESSIONS 08/2019   1. Left ventricular ejection fraction, by visual estimation, is 55 to 60%. The left ventricle has normal function. Normal left ventricular size. There is mildly increased left ventricular hypertrophy.  2. NA due to afib pattern of LV diastolic filling.  3. Global right ventricle has mildly reduced systolic function.The right ventricular size is normal. No increase in right ventricular wall thickness.  4. Left atrial size was normal.  5. Right atrial size was normal.  6. The mitral valve is normal in structure. Trace mitral valve regurgitation. No evidence of mitral stenosis.  7. The tricuspid valve is normal in structure. Tricuspid valve regurgitation is trivial.  8. The aortic valve is tricuspid Aortic valve regurgitation is mild to moderate by color flow Doppler. Structurally normal aortic valve, with no evidence of sclerosis or stenosis.  9. The pulmonic valve was grossly normal. Pulmonic valve regurgitation is not visualized by color flow Doppler. 10. Aortic dilatation noted. 11. There is moderate dilatation of the ascending aorta measuring 40 mm. 12. Normal pulmonary artery systolic pressure. 13. The inferior vena cava is normal in size with greater than 50% respiratory variability, suggesting right atrial pressure of 3 mmHg.   Notes recorded by Josue Hector, MD on 09/08/2019 at 9:38 AM EDT  EF normal mild to moderate AR f/u echo in a year   Assessment/Plan:  1. Atrial fib - persistent - has normal atrial size - successful [facility] 10/01/19 CHADVASC 0  ERA Repeat DCC done 12/16/19 on flecainide with again early failure refer to EP for ablation   2. Anticoagulation -  No bleeding issues on eliquis  Bad headache for 5 days non contrast head CT ordered no  History of trauma   3. 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   4. Dilated aorta - chest CTA ordered His father died with sudden death - this a presumed MI. Precautions were discussed (Bp control, no Valsalva, no Fluoroquinolones).  Cr normal 1.1 12/16/19    5. Mild to moderate AI - f/u echo 08/2020    6. COVID-19 Education: The signs and symptoms of COVID-19 were discussed with the patient and how  to seek care for testing (follow up with PCP or arrange E-visit).  The importance of social distancing, staying at home, hand hygiene and wearing a mask when out in public were discussed today.  Current medicines are reviewed with the patient today.  The patient does not have concerns regarding medicines other than what has been noted above.  The following changes have been made:  None   Labs/ tests ordered today include: Head CT, referral to EP for ablation    Echo 10/21 AR/PAF CTA chest aortic aneurysm normal CR 12/16/19   Disposition:   FU with me in 3 months   Patient is agreeable to this plan and will call if any problems develop in the interim.   Signed: Charlton Haws, MD  12/27/2019 11:11 AM  [facility] [address] [place], [place]  [address] Phone: [phone] Fax: [phone]

## 2019-12-27 ENCOUNTER — Encounter: Payer: Self-pay | Admitting: Cardiovascular Disease

## 2019-12-27 ENCOUNTER — Other Ambulatory Visit: Payer: Self-pay | Admitting: Family Medicine

## 2019-12-27 ENCOUNTER — Ambulatory Visit (INDEPENDENT_AMBULATORY_CARE_PROVIDER_SITE_OTHER)
Admission: RE | Admit: 2019-12-27 | Discharge: 2019-12-27 | Disposition: A | Discharge status: Home / Self Care | Payer: BC Managed Care – PPO | Source: Ambulatory Visit | Attending: Cardiovascular Disease | Admitting: Cardiovascular Disease

## 2019-12-27 ENCOUNTER — Ambulatory Visit: Payer: BC Managed Care – PPO | Admitting: Cardiovascular Disease

## 2019-12-27 ENCOUNTER — Other Ambulatory Visit: Payer: Self-pay

## 2019-12-27 VITALS — BP 130/90 | HR 92 | Ht 73.0 in | Wt 200.2 lb

## 2019-12-27 DIAGNOSIS — R519 Headache, unspecified: Secondary | ICD-10-CM

## 2019-12-27 DIAGNOSIS — I4891 Unspecified atrial fibrillation: Secondary | ICD-10-CM | POA: Diagnosis not present

## 2019-12-27 NOTE — Patient Instructions (Signed)
Medication Instructions:   *If you need a refill on your cardiac medications before your next appointment, please call your pharmacy*  Lab Work:  If you have labs (blood work) drawn today and your tests are completely normal, you will receive your results only by: Marland Kitchen MyChart Message (if you have MyChart) OR . A paper copy in the mail If you have any lab test that is abnormal or we need to change your treatment, we will call you to review the results.  Testing/Procedures: Non-Cardiac CT scanning of head, (CAT scanning), is a noninvasive, special x-ray that produces cross-sectional images of the body using x-rays and a computer. CT scans help physicians diagnose and treat medical conditions. For some CT exams, a contrast material is used to enhance visibility in the area of the body being studied. CT scans provide greater clarity and reveal more details than regular x-ray exams.  Follow-Up: At [facility], you and your health needs are our priority.  As part of our continuing mission to provide you with exceptional heart care, we have created designated Provider Care Teams.  These Care Teams include your primary Cardiologist (physician) and Advanced Practice Providers (APPs -  Physician Assistants and Nurse Practitioners) who all work together to provide you with the care you need, when you need it.  Your next appointment:   3 month(s)  The format for your next appointment:   In Person  Provider:   You may see Dr. Eden Emms or one of the following Advanced Practice Providers on your designated Care Team:    Norma Fredrickson, NP  Nada Boozer, NP  Georgie Chard, NP  You have been referred to have a virtual visit with Dr. Johney Frame on Monday.

## 2019-12-30 ENCOUNTER — Encounter: Payer: Self-pay | Admitting: Internal Medicine

## 2019-12-30 ENCOUNTER — Telehealth (INDEPENDENT_AMBULATORY_CARE_PROVIDER_SITE_OTHER): Payer: BC Managed Care – PPO | Admitting: Internal Medicine

## 2019-12-30 VITALS — Ht 73.0 in | Wt 200.0 lb

## 2019-12-30 DIAGNOSIS — I4819 Other persistent atrial fibrillation: Secondary | ICD-10-CM

## 2019-12-30 NOTE — Telephone Encounter (Signed)
Refill once but recommend trying to taper off if possible.

## 2019-12-30 NOTE — Progress Notes (Signed)
Electrophysiology TeleHealth Note   Due to national recommendations of social distancing due to [place] 19, Audio/video telehealth visit is felt to be most appropriate for this patient at this time.  See MyChart message from today for patient consent regarding telehealth for [name].   Date:  12/30/2019   ID:  Sean [name], DOB October 23, 1956, MRN [id number]  Location: home  Provider location: [place]  [place] Evaluation Performed: New patient consult  PCP:  Sean Post, MD  Cardiologist:  Dr Sean [name] Electrophysiologist:  None   Chief Complaint:  afib  History of Present Illness:    Sean [name] is a 64 y.o. male who presents via audio/video conferencing for a telehealth visit today.   The patient is referred for new consultation regarding afib by Dr Sean [name].  He was found to have atrial fibrillation perioperatively at time of his cataract surgery several months ago. He has had in retrospect palpitations for about 6 months.  He is in afib all the time.  He underwent cardioversions 10/01/2019 and 12/16/2019.  He has failed medical therapy with flecainide. He is tired on the weekends. Today, he denies symptoms of chest pain, shortness of breath, orthopnea, PND, lower extremity edema, claudication, dizziness, presyncope, syncope, bleeding, or neurologic sequela. The patient is tolerating medications without difficulties and is otherwise without complaint today.   he denies symptoms of cough, fevers, chills, or new SOB worrisome for COVID 19.   Past Medical History:  Diagnosis Date  . Atrial fibrillation [facility])     Past Surgical History:  Procedure Laterality Date  . CARDIOVERSION N/A 10/01/2019   Procedure: CARDIOVERSION;  Surgeon: Acie Fredrickson Wonda Cheng, MD;  Location: [facility] ENDOSCOPY;  Service: Cardiovascular;  Laterality: N/A;  . CARDIOVERSION N/A 12/16/2019   Procedure: CARDIOVERSION;  Surgeon: Elouise Munroe, MD;  Location: [facility] ENDOSCOPY;  Service: Cardiovascular;   Laterality: N/A;  . CATARACT EXTRACTION, BILATERAL    . EYE SURGERY  07/30/2019    Current Outpatient Medications  Medication Sig Dispense Refill  . apixaban (ELIQUIS) 5 MG TABS tablet Take 1 tablet (5 mg total) by mouth 2 (two) times daily. 180 tablet 3  . flecainide (TAMBOCOR) 50 MG tablet Take 1.5 tablets (75 mg total) by mouth 2 (two) times daily. 270 tablet 3  . Multiple Vitamins-Minerals (CENTRUM SILVER ADULT 50+ PO) Take 1 tablet by mouth daily.     Marland Kitchen zolpidem (AMBIEN) 10 MG tablet TAKE 1 TABLET (10 MG TOTAL) BY MOUTH AT BEDTIME AS NEEDED. FOR SLEEP 30 tablet 0   [name] current facility-administered medications for this visit.    Allergies:   Patient has [name] known allergies.   Social History:  The patient  reports that he has been smoking cigars. He has never used smokeless tobacco. He reports current alcohol use of about 4.0 - 5.0 standard drinks of alcohol per week. He reports that he does not use drugs.   Family History:  + colon CA   ROS:  Please see the history of present illness.   All other systems are personally reviewed and negative.    Exam:    Vital Signs:  Ht 6\' 1"  (1.854 m)   Wt 200 lb (90.7 kg)   BMI 26.39 kg/m    Well appearing, alert and conversant, regular work of breathing,  good skin color Eyes- anicteric, neuro- grossly intact, skin- [name] apparent rash or lesions or cyanosis, mouth- oral mucosa is pink   Labs/Other Tests and Data Reviewed:    Recent  Labs: 07/31/2019: ALT 42; TSH 1.98 09/25/2019: Platelets 207 12/16/2019: BUN 21; Creatinine, Ser 1.10; Hemoglobin 15.3; Potassium 4.0; Sodium 139   Wt Readings from Last 3 Encounters:  12/30/19 200 lb (90.7 kg)  12/27/19 200 lb 3.2 oz (90.8 kg)  12/16/19 200 lb (90.7 kg)     Other studies personally reviewed: Additional studies/ records that were reviewed today include: Dr Ricki Miller notes,  Prior echo, ekgs  Review of the above records today demonstrates: as above  ASSESSMENT & PLAN:    1.  Persistent  atrial fibrillation The patient has minimally symptomatic, recurrent persistent atrial fibrillation. he has failed medical therapy with flecainide Chads2vasc score is 0.  he is anticoagulated with eliquis AHA guidelines would suggest that he would not require anticoagulation long term. We will continue eliquis for the short term only.  . Therapeutic strategies for afib including rate control and rhythm control were discussed in detail with the patient today. Risk, benefits, and alternatives to EP study and radiofrequency ablation for afib were also discussed in detail today.  Given his atrial enlargement and persistent afib refractory to cardioversion, his anticipated success rates with ablation are probably 60-65% with 1/4 patients requiring repeat procedure.  Given paucity of symptoms, I question the benefit of ablation in this patient.  The patient and I have discussed at length.  He wishes to continue his current approach for now.  If symptoms worsen, he may be more willing to consider ablation.  Return in 2 months If he decides to continue rate control we will stop eliquis at that time.     Current medicines are reviewed at length with the patient today.   The patient does not have concerns regarding his medicines.  The following changes were made today:  none  Labs/ tests ordered today include:  [name] orders of the defined types were placed in this encounter.   Patient Risk:  after full review of this patients clinical status, I feel that they are at moderate risk at this time.   Today, I have spent 30 minutes with the patient with telehealth technology discussing afib .    Signed, Hillis Range MD, [facility] 12/30/2019 3:33 PM   [facility] [address] [place] [address] [phone] (office) [phone] (fax)

## 2020-02-04 ENCOUNTER — Telehealth: Payer: Self-pay | Admitting: Internal Medicine

## 2020-02-04 NOTE — Telephone Encounter (Signed)
We are recommending the COVID-19 vaccine to all of our patients. Cardiac medications (including blood thinners) should not deter anyone from being vaccinated and there is no need to hold any of those medications prior to vaccine administration.     Currently, there is a hotline to call (active 12/06/19) to schedule vaccination appointments as no walk-ins will be accepted.   Number: [phone].    If an appointment is not available please go to [email or web address] to sign up for notification when additional vaccine appointments are available.   If you have further questions or concerns about the vaccine process, please visit [email or web address] or contact your primary care physician.   

## 2020-03-02 ENCOUNTER — Telehealth (INDEPENDENT_AMBULATORY_CARE_PROVIDER_SITE_OTHER): Payer: BC Managed Care – PPO | Admitting: Internal Medicine

## 2020-03-02 ENCOUNTER — Other Ambulatory Visit: Payer: Self-pay

## 2020-03-02 DIAGNOSIS — I4819 Other persistent atrial fibrillation: Secondary | ICD-10-CM | POA: Diagnosis not present

## 2020-03-02 NOTE — Progress Notes (Signed)
Electrophysiology TeleHealth Note   Due to national recommendations of social distancing due to COVID 19, an audio/video telehealth visit is felt to be most appropriate for this patient at this time.  See MyChart message from today for the patient's consent to telehealth for [facility].  Date:  03/02/2020   ID:  Sean [name], DOB 09/24/1956, MRN [id number]  Location: patient's home  Provider location:  [facility]  Evaluation Performed: Follow-up visit  PCP:  Kristian Covey, MD   Electrophysiologist:  Dr Johney Frame  Chief Complaint:  palpitations  History of Present Illness:    Sean [name] is a 64 y.o. male who presents via telehealth conferencing today.  Since last being seen in our clinic, the patient reports doing very well.  He is very active.  He went skiing in [place] recently without cardiac limitation.  He has multiple trips planned in the near future, including a diving trip in [place].   Today, he denies symptoms of afib at this time.  He is occasional tired in the evening but feels that this has improved.  He had headaches which resolved upon stopping flecainide.  The patient is otherwise without complaint today.    Past Medical History:  Diagnosis Date  . Atrial fibrillation [facility])     Past Surgical History:  Procedure Laterality Date  . CARDIOVERSION N/A 10/01/2019   Procedure: CARDIOVERSION;  Surgeon: Elease Hashimoto Deloris Ping, MD;  Location: [facility] ENDOSCOPY;  Service: Cardiovascular;  Laterality: N/A;  . CARDIOVERSION N/A 12/16/2019   Procedure: CARDIOVERSION;  Surgeon: [name], MD;  Location: [facility] ENDOSCOPY;  Service: Cardiovascular;  Laterality: N/A;  . CATARACT EXTRACTION, BILATERAL    . EYE SURGERY  07/30/2019    Current Outpatient Medications  Medication Sig Dispense Refill  . apixaban (ELIQUIS) 5 MG TABS tablet Take 1 tablet (5 mg total) by mouth 2 (two) times daily. 180 tablet 3  . Multiple Vitamins-Minerals (CENTRUM SILVER ADULT 50+ PO) Take 1  tablet by mouth daily.     Marland Kitchen zolpidem (AMBIEN) 10 MG tablet TAKE 1 TABLET (10 MG TOTAL) BY MOUTH AT BEDTIME AS NEEDED. FOR SLEEP 30 tablet 0   No current facility-administered medications for this visit.    Allergies:   Patient has no known allergies.   Social History:  The patient  reports that he has been smoking cigars. He has never used smokeless tobacco. He reports current alcohol use of about 4.0 - 5.0 standard drinks of alcohol per week. He reports that he does not use drugs.   ROS:  Please see the history of present illness.   All other systems are personally reviewed and negative.    Exam:    Vital Signs:  There were no vitals taken for this visit.  Well sounding and appearing, alert and conversant, regular work of breathing,  good skin color Eyes- anicteric, neuro- grossly intact, skin- no apparent rash or lesions or cyanosis, mouth- oral mucosa is pink  Labs/Other Tests and Data Reviewed:    Recent Labs: 07/31/2019: ALT 42; TSH 1.98 09/25/2019: Platelets 207 12/16/2019: BUN 21; Creatinine, Ser 1.10; Hemoglobin 15.3; Potassium 4.0; Sodium 139   Wt Readings from Last 3 Encounters:  12/30/19 200 lb (90.7 kg)  12/27/19 200 lb 3.2 oz (90.8 kg)  12/16/19 200 lb (90.7 kg)       ASSESSMENT & PLAN:    1.  Persistent atrial fibrillation Minimally symptomatic Rate controlled Doing well Would like to continue rate control as his  long term strategy but will let me know if he develops any symptoms in the interim. chads2vasc score is 0.  He would like to stay on eliquis.   Follow-up:  6 months   Patient Risk:  after full review of this patients clinical status, I feel that they are at moderate risk at this time.  Today, I have spent 15 minutes with the patient with telehealth technology discussing arrhythmia management .    Army Fossa, MD  03/02/2020 3:42 PM     [place] [address] [phone]  (office) [phone] (fax)

## 2020-03-25 NOTE — Progress Notes (Signed)
CARDIOLOGY OFFICE NOTE  Date:  03/27/2020    Sean [name] Date of Birth: 29-Jan-1956 Medical Record #[id number]  PCP:  Kristian Covey, MD  Cardiologist:  Francina Ames chief complaint on file.   History of Present Illness: Sean [name] is a 64 y.o. male first seen 08/28/19 for new onset PAF   CHADSVASC of 0. Not symptomatic. Has cut back alcohol use one glass red wine in evening . Successful [facility] 10/01/19 with relapse And repeat [facility] on flecainide 12/16/19 with early failure again Seen by Allred 03/02/20 and ablation or alternative AAT deferred   Echo 09/06/19  EF 55-60% normal atrial size Mild/Moderate AR Trace MR  Myovue 10/21/19 non ischemia EF not accurate due to afib and normal by echo   He is still able to ride bike 1.5 hours on [address] Traveling to [place]/ [place] may do some scuba diving  Discussed starting beta blocker for some rate control He notes HR a bit high with exercise     The patient does not have symptoms concerning for COVID-19 infection (fever, chills, cough, or new shortness of breath).     Past Medical History:  Diagnosis Date  . Atrial fibrillation [facility])     Past Surgical History:  Procedure Laterality Date  . CARDIOVERSION N/A 10/01/2019   Procedure: CARDIOVERSION;  Surgeon: Elease Hashimoto Deloris Ping, MD;  Location: [facility] ENDOSCOPY;  Service: Cardiovascular;  Laterality: N/A;  . CARDIOVERSION N/A 12/16/2019   Procedure: CARDIOVERSION;  Surgeon: [name], MD;  Location: [facility] ENDOSCOPY;  Service: Cardiovascular;  Laterality: N/A;  . CATARACT EXTRACTION, BILATERAL    . EYE SURGERY  07/30/2019     Medications: Current Meds  Medication Sig  . apixaban (ELIQUIS) 5 MG TABS tablet Take 1 tablet (5 mg total) by mouth 2 (two) times daily.  . Multiple Vitamins-Minerals (CENTRUM SILVER ADULT 50+ PO) Take 1 tablet by mouth daily.   Marland Kitchen zolpidem (AMBIEN) 10 MG tablet TAKE 1 TABLET (10 MG TOTAL) BY MOUTH AT BEDTIME AS NEEDED. FOR SLEEP      Allergies: No Known Allergies  Social History: The patient  reports that he has been smoking cigars. He has never used smokeless tobacco. He reports current alcohol use of about 4.0 - 5.0 standard drinks of alcohol per week. He reports that he does not use drugs.   Family History: The patient's family history includes Colon cancer in his mother. His father died at 52 - had sudden death - no autopsy. Mother died at 46 with colon cancer.   Review of Systems: Please see the history of present illness.   All other systems are reviewed and negative.   Physical Exam: VS:  BP 130/68   Pulse 84   Ht 6\' 1"  (1.854 m)   Wt 203 lb (92.1 kg)   SpO2 98%   BMI 26.78 kg/m  .  BMI Body mass index is 26.78 kg/m.  Wt Readings from Last 3 Encounters:  03/27/20 203 lb (92.1 kg)  12/30/19 200 lb (90.7 kg)  12/27/19 200 lb 3.2 oz (90.8 kg)    Affect appropriate Healthy:  appears stated age HEENT: normal Neck supple with no adenopathy JVP normal no bruits no thyromegaly Lungs clear with no wheezing and good diaphragmatic motion Heart:  S1/S2 AR  murmur, no rub, gallop or click PMI normal Abdomen: benighn, BS positve, no tenderness, no AAA no bruit.  No HSM or HJR Distal pulses intact with no bruits No edema Neuro non-focal Skin  warm and dry No muscular weakness    LABORATORY DATA:  EKG:  12/24/19 afibr/flutter rate 92 otherwise normal   Lab Results  Component Value Date   WBC 6.8 09/25/2019   HGB 15.3 12/16/2019   HCT 45.0 12/16/2019   PLT 207 09/25/2019   GLUCOSE 105 (H) 12/16/2019   CHOL 178 08/31/2018   TRIG 167.0 (H) 08/31/2018   HDL 56.80 08/31/2018   LDLDIRECT 137.4 10/15/2010   LDLCALC 88 08/31/2018   ALT 42 07/31/2019   AST 38 (H) 07/31/2019   NA 139 12/16/2019   K 4.0 12/16/2019   CL 108 12/16/2019   CREATININE 1.10 12/16/2019   BUN 21 12/16/2019   CO2 22 09/25/2019   TSH 1.98 07/31/2019   PSA 3.14 08/31/2018   INR 1.1 09/25/2019     BNP (last 3 results)  No results for input(s): BNP in the last 8760 hours.  ProBNP (last 3 results) No results for input(s): PROBNP in the last 8760 hours.   Other Studies Reviewed Today:  ECHO IMPRESSIONS 08/2019   1. Left ventricular ejection fraction, by visual estimation, is 55 to 60%. The left ventricle has normal function. Normal left ventricular size. There is mildly increased left ventricular hypertrophy.  2. NA due to afib pattern of LV diastolic filling.  3. Global right ventricle has mildly reduced systolic function.The right ventricular size is normal. No increase in right ventricular wall thickness.  4. Left atrial size was normal.  5. Right atrial size was normal.  6. The mitral valve is normal in structure. Trace mitral valve regurgitation. No evidence of mitral stenosis.  7. The tricuspid valve is normal in structure. Tricuspid valve regurgitation is trivial.  8. The aortic valve is tricuspid Aortic valve regurgitation is mild to moderate by color flow Doppler. Structurally normal aortic valve, with no evidence of sclerosis or stenosis.  9. The pulmonic valve was grossly normal. Pulmonic valve regurgitation is not visualized by color flow Doppler. 10. Aortic dilatation noted. 11. There is moderate dilatation of the ascending aorta measuring 40 mm. 12. Normal pulmonary artery systolic pressure. 13. The inferior vena cava is normal in size with greater than 50% respiratory variability, suggesting right atrial pressure of 3 mmHg.   Notes recorded by Wendall Stade, MD on 09/08/2019 at 9:38 AM EDT  EF normal mild to moderate AR f/u echo in a year   Assessment/Plan:  1. Atrial fib - persistent - has normal atrial size - successful [facility] 10/01/19 CHADVASC 0 ERA Repeat DCC done 12/16/19 on flecainide with again early failure Seen by Dr Johney Frame 03/02/20 deferred ablation Flecainide caused severe headache Start Toprol 25 mg daily for rate control   2. Anticoagulation -  No bleeding issues on eliquis   Despite CHADVASC  0 he prefers to stay on Eliquis   3. 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   4. Dilated aorta - chest CTA ordered His father died with sudden death - this a presumed MI. Precautions were discussed (Bp control, no Valsalva, no Fluoroquinolones).  Cr normal 1.1 12/16/19 CTA next y ear    5. Mild to moderate AI - f/u echo 08/2020    6. COVID-19 Education: The signs and symptoms of COVID-19 were discussed with the patient and how to seek care for testing (follow up with PCP or arrange E-visit).  The importance of social distancing, staying at home, hand hygiene and wearing a mask when out in public were discussed today.  Current medicines are  reviewed with the patient today.  The patient does not have concerns regarding medicines other than what has been noted above.  The following changes have been made:  None   Echo 10/21 AR/PAF CTA chest aortic aneurysm next year   Disposition:   FU with Allred 6 months and me in a year   Patient is agreeable to this plan and will call if any problems develop in the interim.   Signed: Jenkins Rouge, MD  03/27/2020 8:56 AM  [place] [address] [place], [place]  [address] Phone: [phone] Fax: [phone]

## 2020-03-27 ENCOUNTER — Encounter: Payer: Self-pay | Admitting: Cardiovascular Disease

## 2020-03-27 ENCOUNTER — Ambulatory Visit: Payer: BC Managed Care – PPO | Admitting: Cardiovascular Disease

## 2020-03-27 ENCOUNTER — Other Ambulatory Visit: Payer: Self-pay

## 2020-03-27 VITALS — BP 130/68 | HR 84 | Ht 73.0 in | Wt 203.0 lb

## 2020-03-27 DIAGNOSIS — I482 Chronic atrial fibrillation, unspecified: Secondary | ICD-10-CM | POA: Diagnosis not present

## 2020-03-27 MED ORDER — METOPROLOL SUCCINATE ER 25 MG PO TB24: 25.0000 mg | ORAL_TABLET | Freq: Every day | ORAL | 3 refills | Status: DC

## 2020-03-27 NOTE — Patient Instructions (Signed)
Medication Instructions:  Your physician has recommended you make the following change in your medication:  1-Start Metoprolol 25 mg by mouth daily.  *If you need a refill on your cardiac medications before your next appointment, please call your pharmacy*  Lab Work: If you have labs (blood work) drawn today and your tests are completely normal, you will receive your results only by: Marland Kitchen MyChart Message (if you have MyChart) OR . A paper copy in the mail If you have any lab test that is abnormal or we need to change your treatment, we will call you to review the results.  Testing/Procedures: None ordered today.  Follow-Up: At [facility], you and your health needs are our priority.  As part of our continuing mission to provide you with exceptional heart care, we have created designated Provider Care Teams.  These Care Teams include your primary Cardiologist (physician) and Advanced Practice Providers (APPs -  Physician Assistants and Nurse Practitioners) who all work together to provide you with the care you need, when you need it.  We recommend signing up for the patient portal called "MyChart".  Sign up information is provided on this After Visit Summary.  MyChart is used to connect with patients for Virtual Visits (Telemedicine).  Patients are able to view lab/test results, encounter notes, upcoming appointments, etc.  Non-urgent messages can be sent to your provider as well.   To learn more about what you can do with MyChart, go to [email or web address].    Your next appointment:   6 month(s)  The format for your next appointment:   In Person  Provider:   You may see Dr. Eden Emms or one of the following Advanced Practice Providers on your designated Care Team:    Norma Fredrickson, NP  Nada Boozer, NP  Georgie Chard, NP

## 2020-05-04 ENCOUNTER — Other Ambulatory Visit: Payer: Self-pay | Admitting: Family Medicine

## 2020-05-04 NOTE — Telephone Encounter (Signed)
Please advise last ov:08/18/2019

## 2020-05-06 DIAGNOSIS — D3612 Benign neoplasm of peripheral nerves and autonomic nervous system, upper limb, including shoulder: Secondary | ICD-10-CM | POA: Diagnosis not present

## 2020-05-06 DIAGNOSIS — D485 Neoplasm of uncertain behavior of skin: Secondary | ICD-10-CM | POA: Diagnosis not present

## 2020-05-06 DIAGNOSIS — C4401 Basal cell carcinoma of skin of lip: Secondary | ICD-10-CM | POA: Diagnosis not present

## 2020-05-06 DIAGNOSIS — L570 Actinic keratosis: Secondary | ICD-10-CM | POA: Diagnosis not present

## 2020-05-06 DIAGNOSIS — C44319 Basal cell carcinoma of skin of other parts of face: Secondary | ICD-10-CM | POA: Diagnosis not present

## 2020-05-06 DIAGNOSIS — L821 Other seborrheic keratosis: Secondary | ICD-10-CM | POA: Diagnosis not present

## 2020-05-06 DIAGNOSIS — D225 Melanocytic nevi of trunk: Secondary | ICD-10-CM | POA: Diagnosis not present

## 2020-05-06 DIAGNOSIS — Z85828 Personal history of other malignant neoplasm of skin: Secondary | ICD-10-CM | POA: Diagnosis not present

## 2020-05-08 ENCOUNTER — Other Ambulatory Visit: Payer: Self-pay

## 2020-05-08 ENCOUNTER — Ambulatory Visit (HOSPITAL_COMMUNITY): Payer: BC Managed Care – PPO | Attending: Cardiology

## 2020-05-08 DIAGNOSIS — I77810 Thoracic aortic ectasia: Secondary | ICD-10-CM | POA: Diagnosis not present

## 2020-05-11 ENCOUNTER — Telehealth: Payer: Self-pay

## 2020-05-11 NOTE — Telephone Encounter (Signed)
-----   Message from Wendall Stade, MD sent at 05/09/2020  1:44 PM EDT ----- AR just mild good EF normal good Known aortic root dilatation planning CT next year

## 2020-05-11 NOTE — Telephone Encounter (Signed)
Left message for patient to call back  

## 2020-05-12 NOTE — Telephone Encounter (Signed)
Left message for patient to call back. Will send results through Mychart.

## 2020-06-07 DIAGNOSIS — M791 Myalgia, unspecified site: Secondary | ICD-10-CM | POA: Diagnosis not present

## 2020-06-17 DIAGNOSIS — L989 Disorder of the skin and subcutaneous tissue, unspecified: Secondary | ICD-10-CM | POA: Diagnosis not present

## 2020-06-17 DIAGNOSIS — D0339 Melanoma in situ of other parts of face: Secondary | ICD-10-CM | POA: Diagnosis not present

## 2020-06-17 DIAGNOSIS — L905 Scar conditions and fibrosis of skin: Secondary | ICD-10-CM | POA: Diagnosis not present

## 2020-07-07 ENCOUNTER — Telehealth: Payer: Self-pay | Admitting: Cardiovascular Disease

## 2020-07-07 NOTE — Telephone Encounter (Signed)
Pt called and wanted to know if it was necessary to see Dr. Johney Frame since he is seeing Dr. Eden Emms in November. Please call to discuss

## 2020-07-07 NOTE — Telephone Encounter (Signed)
Called patient back about his message. Informed him, per his last office visit note, he is suppose to see Dr. Johney [name] in 6 months (October 2021) and see Dr. Eden [name] in 1 year (April 2022). Patient has appointment with Dr. Johney [name] in October and a recall has been placed for Dr. Eden [name]. Patient agreed to plan.

## 2020-07-29 ENCOUNTER — Other Ambulatory Visit: Payer: Self-pay | Admitting: Family Medicine

## 2020-08-10 ENCOUNTER — Other Ambulatory Visit: Payer: Self-pay | Admitting: Family Medicine

## 2020-08-10 NOTE — Telephone Encounter (Signed)
Pt wife Marylu Lund called to say he needs a refill on his medication zolpidem (AMBIEN) 10 MG tablet   Please [facility] to [id number] [name], [place] - [address]  [address] [place] [address]  Phone:  [phone] Fax:  [phone]   Please advise

## 2020-08-11 NOTE — Telephone Encounter (Signed)
Spoke with the pts wife and informed her [facility].

## 2020-08-13 ENCOUNTER — Telehealth: Payer: Self-pay | Admitting: Cardiovascular Disease

## 2020-08-13 MED ORDER — APIXABAN 5 MG PO TABS: 5.0000 mg | ORAL_TABLET | Freq: Two times a day (BID) | ORAL | 0 refills | Status: DC

## 2020-08-13 NOTE — Telephone Encounter (Signed)
Pt called and stated that he left he Eliquis at home and is currently in [place], [place] attending a funeral. Wants to know if we can send around a week's supply of Eliquis to a [facility] in [place]. Please call back

## 2020-08-13 NOTE — Telephone Encounter (Signed)
The med was already sent to that address as requested, I spoke with prior to this last message.

## 2020-08-13 NOTE — Telephone Encounter (Signed)
Eliquis 5mg  refill request received. Patient is 64 years old, weight-92.1kg, Crea-1.10 on 12/16/2019, Diagnosis-Afib, and last seen by Dr. 12/18/2019 on 03/27/20. Dose is appropriate based on dosing criteria. Will send in refill to requested pharmacy as pt is out of town for a funeral.   03/29/20 pt and confirmed address of where to send. Pt stated to send 8 tabs and I did to the selected pharmacy in [place].

## 2020-08-13 NOTE — Telephone Encounter (Signed)
Sean [name] is calling back with the address to the pharmacy he is needing it sent to. He states he would like it to go to [facility] [address], [place] [address]. Their phone number is [phone].

## 2020-08-21 ENCOUNTER — Other Ambulatory Visit: Payer: Self-pay | Admitting: Cardiovascular Disease

## 2020-08-21 NOTE — Telephone Encounter (Signed)
Pt last saw Dr Eden Emms 03/27/20, last labs 12/16/19 Creat 1.10, age 64, weight 92.1kg, based on specified criteria pt is on appropriate dosage of Eliquis 5mg  BID.  Will refill rx.

## 2020-08-25 DIAGNOSIS — H00012 Hordeolum externum right lower eyelid: Secondary | ICD-10-CM | POA: Diagnosis not present

## 2020-09-11 ENCOUNTER — Encounter: Payer: Self-pay | Admitting: Internal Medicine

## 2020-09-11 ENCOUNTER — Other Ambulatory Visit: Payer: Self-pay

## 2020-09-11 ENCOUNTER — Ambulatory Visit: Payer: BC Managed Care – PPO | Admitting: Internal Medicine

## 2020-09-11 VITALS — BP 110/84 | HR 95 | Ht 73.0 in | Wt 204.4 lb

## 2020-09-11 DIAGNOSIS — I4819 Other persistent atrial fibrillation: Secondary | ICD-10-CM | POA: Diagnosis not present

## 2020-09-11 NOTE — Patient Instructions (Signed)
Medication Instructions:  Your physician recommends that you continue on your current medications as directed. Please refer to the Current Medication list given to you today.  *If you need a refill on your cardiac medications before your next appointment, please call your pharmacy*  Lab Work: None ordered.  If you have labs (blood work) drawn today and your tests are completely normal, you will receive your results only by: . MyChart Message (if you have MyChart) OR . A paper copy in the mail If you have any lab test that is abnormal or we need to change your treatment, we will call you to review the results.  Testing/Procedures: None ordered.  Follow-Up: At [facility], you and your health needs are our priority.  As part of our continuing mission to provide you with exceptional heart care, we have created designated Provider Care Teams.  These Care Teams include your primary Cardiologist (physician) and Advanced Practice Providers (APPs -  Physician Assistants and Nurse Practitioners) who all work together to provide you with the care you need, when you need it.  We recommend signing up for the patient portal called "MyChart".  Sign up information is provided on this After Visit Summary.  MyChart is used to connect with patients for Virtual Visits (Telemedicine).  Patients are able to view lab/test results, encounter notes, upcoming appointments, etc.  Non-urgent messages can be sent to your provider as well.   To learn more about what you can do with MyChart, go to [email or web address].    Your next appointment:   Your physician wants you to follow-up in: 1 year with Dr. Allred. You will receive a reminder letter in the mail two months in advance. If you don't receive a letter, please call our office to schedule the follow-up appointment.   Other Instructions:  

## 2020-09-11 NOTE — Progress Notes (Signed)
PCP: Kristian Covey, MD Primary Cardiologist: Dr Eden Emms Primary EP: Dr Johney Frame  Sean [name] is a 64 y.o. male who presents today for routine electrophysiology followup.  Since last being seen in our clinic, the patient reports doing very well.  He is active, working as a Clinical research associate.  He rides his bike without difficulty.  No symptoms of his afib.  Energy is good.  Today, he denies symptoms of palpitations, chest pain, shortness of breath,  lower extremity edema, dizziness, presyncope, or syncope.  The patient is otherwise without complaint today.   Past Medical History:  Diagnosis Date  . Atrial fibrillation [facility])    Past Surgical History:  Procedure Laterality Date  . CARDIOVERSION N/A 10/01/2019   Procedure: CARDIOVERSION;  Surgeon: Elease Hashimoto Deloris Ping, MD;  Location: [facility] ENDOSCOPY;  Service: Cardiovascular;  Laterality: N/A;  . CARDIOVERSION N/A 12/16/2019   Procedure: CARDIOVERSION;  Surgeon: [name], MD;  Location: [facility] ENDOSCOPY;  Service: Cardiovascular;  Laterality: N/A;  . CATARACT EXTRACTION, BILATERAL    . EYE SURGERY  07/30/2019    ROS- all systems are reviewed and negatives except as per HPI above  Current Outpatient Medications  Medication Sig Dispense Refill  . ELIQUIS 5 MG TABS tablet TAKE 1 TABLET BY MOUTH TWICE A DAY 180 tablet 1  . metoprolol succinate (TOPROL XL) 25 MG 24 hr tablet Take 1 tablet (25 mg total) by mouth daily. 90 tablet 3  . Multiple Vitamins-Minerals (CENTRUM SILVER ADULT 50+ PO) Take 1 tablet by mouth daily.     Marland Kitchen zolpidem (AMBIEN) 10 MG tablet TAKE 1 TABLET (10 MG TOTAL) BY MOUTH AT BEDTIME AS NEEDED. FOR SLEEP 30 tablet 0   No current facility-administered medications for this visit.    Physical Exam: Vitals:   09/11/20 1612  BP: 110/84  Pulse: 95  SpO2: 97%  Weight: 204 lb 6.4 oz (92.7 kg)  Height: 6\' 1"  (1.854 m)    GEN- The patient is well appearing, alert and oriented x 3 today.   Head- normocephalic, atraumatic Eyes-   Sclera clear, conjunctiva pink Ears- hearing intact Oropharynx- clear Lungs- Clear to ausculation bilaterally, normal work of breathing Heart- irregular rate and rhythm  GI- soft, NT, ND, + BS Extremities- no clubbing, cyanosis, or edema  Wt Readings from Last 3 Encounters:  09/11/20 204 lb 6.4 oz (92.7 kg)  03/27/20 203 lb (92.1 kg)  12/30/19 200 lb (90.7 kg)    EKG tracing ordered today is personally reviewed and shows afib, V rate 93 bpm, incomplete RBBB  Assessment and Plan:  1. Persistent afib Rate controlled and asymptomatic Did not tolerate flecainide due to headaches chads2vasc score is 0.  He would like to stay on eliquis Moderate LA enlargement We again discussed options of rate control and rhythm control at length today.  We again discussed ablation as an option.  He would prefer to continue rate control for now. Given paucity of symptoms, I think that this is very reasonable. We discussed rate control strategies and importance of resting heart rate ideally < 80 bpm and mostly <100 bpm going forward He wishes to reserve toprol for worsening V rates.  He has scheduled follow-up with Dr 02/27/20 in 6 months I will see in a year so as to stagger appointments He is aware that I am happy to see him at any time should problems arise.  Risks, benefits and potential toxicities for medications prescribed and/or refilled reviewed with patient today.   Eden Emms  MD, [facility] 09/11/2020 4:17 PM

## 2020-10-06 ENCOUNTER — Ambulatory Visit: Payer: BC Managed Care – PPO | Admitting: Cardiovascular Disease

## 2020-11-05 DIAGNOSIS — D2261 Melanocytic nevi of right upper limb, including shoulder: Secondary | ICD-10-CM | POA: Diagnosis not present

## 2020-11-05 DIAGNOSIS — L821 Other seborrheic keratosis: Secondary | ICD-10-CM | POA: Diagnosis not present

## 2020-11-05 DIAGNOSIS — D485 Neoplasm of uncertain behavior of skin: Secondary | ICD-10-CM | POA: Diagnosis not present

## 2020-11-05 DIAGNOSIS — D2262 Melanocytic nevi of left upper limb, including shoulder: Secondary | ICD-10-CM | POA: Diagnosis not present

## 2020-11-05 DIAGNOSIS — Z85828 Personal history of other malignant neoplasm of skin: Secondary | ICD-10-CM | POA: Diagnosis not present

## 2020-11-05 DIAGNOSIS — D3617 Benign neoplasm of peripheral nerves and autonomic nervous system of trunk, unspecified: Secondary | ICD-10-CM | POA: Diagnosis not present

## 2020-11-16 ENCOUNTER — Other Ambulatory Visit: Payer: Self-pay | Admitting: *Deleted

## 2020-11-16 MED ORDER — METOPROLOL SUCCINATE ER 25 MG PO TB24: 25.0000 mg | ORAL_TABLET | Freq: Every day | ORAL | 0 refills | Status: DC

## 2020-11-16 NOTE — Telephone Encounter (Signed)
Patient called and stated that he went to the pharmacy to pick up a medication that Dr Eden Emms prescribed for him some time back to have on hand as an optional medication but the pharmacy did not have anything other than the eliquis. Upon review of his chart, it was determined that the med that he was referring to was metoprolol succinate which has refills remaining and still should be an active rx. I called and spoke with the pharmacist and he stated that the patient requested to deactivate this rx back in August. I will update and send to patients pharmacy as requested.

## 2020-12-16 DIAGNOSIS — C4401 Basal cell carcinoma of skin of lip: Secondary | ICD-10-CM | POA: Diagnosis not present

## 2021-01-28 ENCOUNTER — Other Ambulatory Visit: Payer: Self-pay | Admitting: Cardiovascular Disease

## 2021-01-28 DIAGNOSIS — I480 Paroxysmal atrial fibrillation: Secondary | ICD-10-CM

## 2021-01-28 NOTE — Telephone Encounter (Signed)
Prescription refill request for Eliquis received. Indication: a fib Last office visit: 09/11/20 Scr: 1.1 Age: 65 Weight: 92 kg

## 2021-01-30 ENCOUNTER — Other Ambulatory Visit: Payer: Self-pay | Admitting: Family Medicine

## 2021-02-01 NOTE — Telephone Encounter (Signed)
Needs follow up. Not seen in one year.  

## 2021-02-01 NOTE — Telephone Encounter (Signed)
Is this okay to refill? 

## 2021-02-02 DIAGNOSIS — M25511 Pain in right shoulder: Secondary | ICD-10-CM | POA: Diagnosis not present

## 2021-02-08 ENCOUNTER — Other Ambulatory Visit: Payer: Self-pay

## 2021-02-08 ENCOUNTER — Encounter: Payer: Self-pay | Admitting: Family Medicine

## 2021-02-08 ENCOUNTER — Ambulatory Visit: Payer: BC Managed Care – PPO | Admitting: Family Medicine

## 2021-02-08 VITALS — BP 100/72 | HR 74 | Temp 98.3°F | Ht 73.0 in | Wt 205.2 lb

## 2021-02-08 DIAGNOSIS — G4700 Insomnia, unspecified: Secondary | ICD-10-CM

## 2021-02-08 MED ORDER — ZOLPIDEM TARTRATE 10 MG PO TABS: 10.0000 mg | ORAL_TABLET | Freq: Every evening | ORAL | 0 refills | Status: DC | PRN

## 2021-02-08 NOTE — Patient Instructions (Signed)

## 2021-02-08 NOTE — Progress Notes (Signed)
Established Patient Office Visit  Subjective:  Patient ID: Sean [name], male    DOB: 1956-04-27  Age: 65 y.o. MRN: [id number]  CC:  Chief Complaint  Patient presents with  . Medication Refill    HPI ROHN FRITSCH presents for chronic intermittent insomnia.  He has atrial fibrillation followed by cardiology for that.  He is maintained on Eliquis and was recently prescribed metoprolol which she has not yet started.  He was out in [place] couple weeks ago and had a skiing accident.  He had some right shoulder pain but this is slowly improving.  No head injury.  No fractures.  He has insomnia and takes half of 10 mg Ambien as needed intermittently.  He tries uses sparingly.  Past Medical History:  Diagnosis Date  . Atrial fibrillation [facility])     Past Surgical History:  Procedure Laterality Date  . CARDIOVERSION N/A 10/01/2019   Procedure: CARDIOVERSION;  Surgeon: Elease Hashimoto Deloris Ping, MD;  Location: [facility] ENDOSCOPY;  Service: Cardiovascular;  Laterality: N/A;  . CARDIOVERSION N/A 12/16/2019   Procedure: CARDIOVERSION;  Surgeon: [name], MD;  Location: [facility] ENDOSCOPY;  Service: Cardiovascular;  Laterality: N/A;  . CATARACT EXTRACTION, BILATERAL    . EYE SURGERY  07/30/2019    Family History  Problem Relation Age of Onset  . Colon cancer Mother     Social History   Socioeconomic History  . Marital status: Married    Spouse name: Not on file  . Number of children: Not on file  . Years of education: Not on file  . Highest education level: Not on file  Occupational History  . Not on file  Tobacco Use  . Smoking status: Current Some Day Smoker    Types: Cigars  . Smokeless tobacco: Never Used  Vaping Use  . Vaping Use: Never used  Substance and Sexual Activity  . Alcohol use: Yes    Alcohol/week: 4.0 - 5.0 standard drinks    Types: 4 - 5 Cans of beer per week  . Drug use: No  . Sexual activity: Not on file  Other Topics Concern  . Not on file  Social  History Narrative   Lives in [place] with wife and kids.   He is an Pensions consultant.      Social Determinants of Health   Financial Resource Strain: Not on file  Food Insecurity: Not on file  Transportation Needs: Not on file  Physical Activity: Not on file  Stress: Not on file  Social Connections: Not on file  Intimate Partner Violence: Not on file    Outpatient Medications Prior to Visit  Medication Sig Dispense Refill  . ELIQUIS 5 MG TABS tablet TAKE 1 TABLET BY MOUTH TWICE A DAY 180 tablet 1  . metoprolol succinate (TOPROL-XL) 25 MG 24 hr tablet TAKE 1 TABLET (25 MG TOTAL) BY MOUTH DAILY. 90 tablet 0  . Multiple Vitamins-Minerals (CENTRUM SILVER ADULT 50+ PO) Take 1 tablet by mouth daily.     Marland Kitchen zolpidem (AMBIEN) 10 MG tablet TAKE 1 TABLET (10 MG TOTAL) BY MOUTH AT BEDTIME AS NEEDED. FOR SLEEP 30 tablet 0   No facility-administered medications prior to visit.    No Known Allergies  ROS Review of Systems  Constitutional: Negative for fatigue.  Eyes: Negative for visual disturbance.  Respiratory: Negative for cough, chest tightness and shortness of breath.   Cardiovascular: Negative for chest pain, palpitations and leg swelling.  Neurological: Negative for dizziness, syncope, weakness, light-headedness and headaches.  Objective:    Physical Exam Vitals reviewed.  Constitutional:      Appearance: Normal appearance.  Cardiovascular:     Rate and Rhythm: Normal rate.     Comments: Irregular rhythm Pulmonary:     Effort: Pulmonary effort is normal.     Breath sounds: Normal breath sounds.  Neurological:     Mental Status: He is alert.  Psychiatric:        Mood and Affect: Mood normal.     BP 100/72 (BP Location: Left Arm, Patient Position: Sitting, Cuff Size: Large)   Pulse 74   Temp 98.3 F (36.8 C) (Oral)   Ht 6\' 1"  (1.854 m)   Wt 205 lb 3.2 oz (93.1 kg)   SpO2 96%   BMI 27.07 kg/m  Wt Readings from Last 3 Encounters:  02/08/21 205 lb 3.2 oz (93.1 kg)   09/11/20 204 lb 6.4 oz (92.7 kg)  03/27/20 203 lb (92.1 kg)     Health Maintenance Due  Topic Date Due  . TETANUS/TDAP  11/28/2016  . INFLUENZA VACCINE  06/28/2020  . COVID-19 Vaccine (2 - Pfizer 3-dose series) 10/19/2020    There are no preventive care reminders to display for this patient.  Lab Results  Component Value Date   TSH 1.98 07/31/2019   Lab Results  Component Value Date   WBC 6.8 09/25/2019   HGB 15.3 12/16/2019   HCT 45.0 12/16/2019   MCV 96 09/25/2019   PLT 207 09/25/2019   Lab Results  Component Value Date   NA 139 12/16/2019   K 4.0 12/16/2019   CO2 22 09/25/2019   GLUCOSE 105 (H) 12/16/2019   BUN 21 12/16/2019   CREATININE 1.10 12/16/2019   BILITOT 0.5 07/31/2019   ALKPHOS 59 07/31/2019   AST 38 (H) 07/31/2019   ALT 42 07/31/2019   PROT 6.5 07/31/2019   ALBUMIN 4.1 07/31/2019   CALCIUM 9.3 09/25/2019   GFR 67.74 07/31/2019   Lab Results  Component Value Date   CHOL 178 08/31/2018   Lab Results  Component Value Date   HDL 56.80 08/31/2018   Lab Results  Component Value Date   LDLCALC 88 08/31/2018   Lab Results  Component Value Date   TRIG 167.0 (H) 08/31/2018   Lab Results  Component Value Date   CHOLHDL 3 08/31/2018   No results found for: HGBA1C    Assessment & Plan:   Problem List Items Addressed This Visit      Unprioritized   INSOMNIA, CHRONIC, MILD - Primary    -Continue good sleep hygiene with avoidance of late the use of caffeine and avoidance of regular alcohol use at night  -We did discuss potential downsides of medication such as Ambien including potential for memory loss with long-term use.  He uses those very sporadically.  Refilled Ambien 10 mg 1/2 tablet to 1 tablet nightly as needed #30 with no refills  Meds ordered this encounter  Medications  . zolpidem (AMBIEN) 10 MG tablet    Sig: Take 1 tablet (10 mg total) by mouth at bedtime as needed. for sleep    Dispense:  30 tablet    Refill:  0    Not to  exceed 5 additional fills before 10/31/2020    Follow-up: No follow-ups on file.    14/02/2020, MD

## 2021-02-10 DIAGNOSIS — M25511 Pain in right shoulder: Secondary | ICD-10-CM | POA: Diagnosis not present

## 2021-02-12 DIAGNOSIS — M25511 Pain in right shoulder: Secondary | ICD-10-CM | POA: Diagnosis not present

## 2021-02-16 DIAGNOSIS — M25511 Pain in right shoulder: Secondary | ICD-10-CM | POA: Diagnosis not present

## 2021-02-25 DIAGNOSIS — M25511 Pain in right shoulder: Secondary | ICD-10-CM | POA: Diagnosis not present

## 2021-03-08 DIAGNOSIS — M25511 Pain in right shoulder: Secondary | ICD-10-CM | POA: Diagnosis not present

## 2021-03-18 DIAGNOSIS — M25511 Pain in right shoulder: Secondary | ICD-10-CM | POA: Diagnosis not present

## 2021-03-25 DIAGNOSIS — M25511 Pain in right shoulder: Secondary | ICD-10-CM | POA: Diagnosis not present

## 2021-03-30 DIAGNOSIS — M25511 Pain in right shoulder: Secondary | ICD-10-CM | POA: Diagnosis not present

## 2021-04-02 DIAGNOSIS — M25511 Pain in right shoulder: Secondary | ICD-10-CM | POA: Diagnosis not present

## 2021-04-06 DIAGNOSIS — M25511 Pain in right shoulder: Secondary | ICD-10-CM | POA: Diagnosis not present

## 2021-04-08 DIAGNOSIS — M25511 Pain in right shoulder: Secondary | ICD-10-CM | POA: Diagnosis not present

## 2021-04-09 ENCOUNTER — Ambulatory Visit: Payer: BC Managed Care – PPO | Admitting: Adult Health

## 2021-04-09 ENCOUNTER — Other Ambulatory Visit: Payer: Self-pay

## 2021-04-09 VITALS — BP 130/70 | HR 115 | Temp 98.2°F | Ht 73.0 in | Wt 200.0 lb

## 2021-04-09 DIAGNOSIS — R112 Nausea with vomiting, unspecified: Secondary | ICD-10-CM

## 2021-04-09 NOTE — Progress Notes (Signed)
Subjective:    Patient ID: Sean [name], male    DOB: March 20, 1956, 65 y.o.   MRN: [id number]  HPI 65 year old male who  has a past medical history of Atrial fibrillation (HCC).   He presents to the office today for an acute issue.  Reports that yesterday morning he had a sausage egg biscuit from [employer], soon after eating he encountered an episode of vomiting and nausea.  Throughout the day he continued to have nausea and had another 2 episodes of vomiting around 3 AM in the afternoon with diarrhea.  Associated symptoms included abdominal pain.  He did not notice any blood in his vomit or stool.  He woke up this morning symptoms had resolved completely.  Had a normal bowel movement this morning.  He is leaving for [facility] tomorrow morning to go scuba diving and wants to make sure he is okay.   Review of Systems See HPI   Past Medical History:  Diagnosis Date  . Atrial fibrillation [facility])     Social History   Socioeconomic History  . Marital status: Married    Spouse name: Not on file  . Number of children: Not on file  . Years of education: Not on file  . Highest education level: Not on file  Occupational History  . Not on file  Tobacco Use  . Smoking status: Current Some Day Smoker    Types: Cigars  . Smokeless tobacco: Never Used  Vaping Use  . Vaping Use: Never used  Substance and Sexual Activity  . Alcohol use: Yes    Alcohol/week: 4.0 - 5.0 standard drinks    Types: 4 - 5 Cans of beer per week  . Drug use: No  . Sexual activity: Not on file  Other Topics Concern  . Not on file  Social History Narrative   Lives in [place] with wife and kids.   He is an Pensions consultant.      Social Determinants of Health   Financial Resource Strain: Not on file  Food Insecurity: Not on file  Transportation Needs: Not on file  Physical Activity: Not on file  Stress: Not on file  Social Connections: Not on file  Intimate Partner Violence: Not on file    Past Surgical  History:  Procedure Laterality Date  . CARDIOVERSION N/A 10/01/2019   Procedure: CARDIOVERSION;  Surgeon: Elease Hashimoto Deloris Ping, MD;  Location: [facility] ENDOSCOPY;  Service: Cardiovascular;  Laterality: N/A;  . CARDIOVERSION N/A 12/16/2019   Procedure: CARDIOVERSION;  Surgeon: [name], MD;  Location: [facility] ENDOSCOPY;  Service: Cardiovascular;  Laterality: N/A;  . CATARACT EXTRACTION, BILATERAL    . EYE SURGERY  07/30/2019    Family History  Problem Relation Age of Onset  . Colon cancer Mother     No Known Allergies  Current Outpatient Medications on File Prior to Visit  Medication Sig Dispense Refill  . ELIQUIS 5 MG TABS tablet TAKE 1 TABLET BY MOUTH TWICE A DAY 180 tablet 1  . metoprolol succinate (TOPROL-XL) 25 MG 24 hr tablet TAKE 1 TABLET (25 MG TOTAL) BY MOUTH DAILY. 90 tablet 0  . zolpidem (AMBIEN) 10 MG tablet Take 1 tablet (10 mg total) by mouth at bedtime as needed. for sleep 30 tablet 0   No current facility-administered medications on file prior to visit.    BP 130/70 (BP Location: Left Arm, Patient Position: Sitting, Cuff Size: Normal)   Pulse (!) 115   Temp 98.2 F (36.8 C) (Oral)  Ht 6\' 1"  (1.854 m)   Wt 200 lb (90.7 kg)   SpO2 95%   BMI 26.39 kg/m       Objective:   Physical Exam Vitals and nursing note reviewed.  Constitutional:      Appearance: Normal appearance.  Cardiovascular:     Rate and Rhythm: Normal rate and regular rhythm.     Pulses: Normal pulses.     Heart sounds: Normal heart sounds.  Pulmonary:     Effort: Pulmonary effort is normal.     Breath sounds: Normal breath sounds.  Abdominal:     General: Abdomen is flat. Bowel sounds are normal.     Palpations: Abdomen is soft.     Tenderness: There is no abdominal tenderness.  Musculoskeletal:        General: Normal range of motion.  Skin:    General: Skin is warm and dry.  Neurological:     General: No focal deficit present.     Mental Status: He is alert and oriented to person,  place, and time.  Psychiatric:        Mood and Affect: Mood normal.        Behavior: Behavior normal.        Thought Content: Thought content normal.        Judgment: Judgment normal.       Assessment & Plan:  1. Nausea and vomiting, intractability of vomiting not specified, unspecified vomiting type -Likely from food.  Symptoms have completely resolved.  Benign exam.  , NP

## 2021-04-23 DIAGNOSIS — S46091A Other injury of muscle(s) and tendon(s) of the rotator cuff of right shoulder, initial encounter: Secondary | ICD-10-CM | POA: Diagnosis not present

## 2021-04-27 ENCOUNTER — Telehealth: Payer: Self-pay | Admitting: *Deleted

## 2021-04-27 ENCOUNTER — Other Ambulatory Visit: Payer: Self-pay | Admitting: Internal Medicine

## 2021-04-27 NOTE — Telephone Encounter (Signed)
   Name: Sean [name]  DOB: 1956/06/12  MRN: [id number]   Primary Cardiologist: None  Chart reviewed as part of pre-operative protocol coverage.   Left a voicemail for patient to call back for ongoing preop assessment.   Beatriz Stallion, PA-C 04/27/2021, 3:19 PM

## 2021-04-27 NOTE — Telephone Encounter (Signed)
Patient with diagnosis of afib on Eliquis for anticoagulation.    Procedure: RIGHT SHOULDER SCOPE w/RCR  Date of procedure: 05/14/21  CHA2DS2-VASc Score = 0  This indicates a 0.2% annual risk of stroke. The patient's score is based upon: CHF History: No HTN History: No Diabetes History: No Stroke History: No Vascular Disease History: No Age Score: 0 Gender Score: 0      CrCl 76.6 ml/min  Per office protocol, patient can hold Eliquis for 2 days prior to procedure.

## 2021-04-27 NOTE — Telephone Encounter (Signed)
   [facility] Pre-operative Risk Assessment    Patient Name: Sean [name]  DOB: [birth year]  MRN: [id number]   HEARTCARE STAFF: - Please ensure there is not already an duplicate clearance open for this procedure. - Under Visit Info/Reason for Call, type in Other and utilize the format Clearance MM/DD/YY or Clearance TBD. Do not use dashes or single digits. - If request is for dental extraction, please clarify the # of teeth to be extracted. - If the patient is currently at the dentist's office, call Pre-Op APP to address. If the patient is not currently in the dentist office, please route to the Pre-Op pool  Request for surgical clearance:  1. What type of surgery is being performed? RIGHT SHOULDER SCOPE w/RCR   2. When is this surgery scheduled? 05/14/21   3. What type of clearance is required (medical clearance vs. Pharmacy clearance to hold med vs. Both)? BOTH  4. Are there any medications that need to be held prior to surgery and how long? ELIQUIS   5. Practice name and name of physician performing surgery? [facility]; DR. Lennette Bihari SUPPLE   6. What is the office phone number? [phone]   7.   What is the office fax number? [phone] ATTN: [place]  8.   Anesthesia type (None, local, MAC, general) ? GENERAL   Julaine Hua 04/27/2021, 12:28 PM  _________________________________________________________________   (provider comments below)

## 2021-04-29 NOTE — Telephone Encounter (Signed)
    Sean [name] DOB:  1956/03/04  MRN:  [id number]   Primary Cardiologist: None  Primary Electrophysiologist: Dr. Johney Frame  Chart reviewed as part of pre-operative protocol coverage.   Left VM requesting call back.   Alver Sorrow, NP 04/29/2021, 1:09 PM

## 2021-05-03 NOTE — Telephone Encounter (Signed)
    Sean [name] DOB:  [birth year]  MRN:  [id number]   Primary Cardiologist: Dr. Eden Emms, MD   Chart reviewed as part of pre-operative protocol coverage. Given past medical history and time since last visit, based on ACC/AHA guidelines, Sean [name] would be at acceptable risk for the planned procedure without further cardiovascular testing.   Patient with diagnosis of afib on Eliquis for anticoagulation.    Procedure: RIGHT SHOULDER SCOPE w/RCR Date of procedure: 05/14/21  CHA2DS2-VASc Score = 0  This indicates a 0.2% annual risk of stroke. The patient's score is based upon: CHF History: No HTN History: No Diabetes History: No Stroke History: No Vascular Disease History: No Age Score: 0 Gender Score: 0    CrCl 76.6 ml/min  Per office protocol, patient can hold Eliquis for 2 days prior to procedure.    The patient was advised that if he develops new symptoms prior to surgery to contact our office to arrange for a follow-up visit, and he verbalized understanding.  I will route this recommendation to the requesting party via Epic fax function and remove from pre-op pool.  Please call with questions.  Georgie Chard, NP 05/03/2021, 1:18 PM

## 2021-05-12 DIAGNOSIS — L821 Other seborrheic keratosis: Secondary | ICD-10-CM | POA: Diagnosis not present

## 2021-05-12 DIAGNOSIS — L814 Other melanin hyperpigmentation: Secondary | ICD-10-CM | POA: Diagnosis not present

## 2021-05-12 DIAGNOSIS — Z85828 Personal history of other malignant neoplasm of skin: Secondary | ICD-10-CM | POA: Diagnosis not present

## 2021-05-12 DIAGNOSIS — L281 Prurigo nodularis: Secondary | ICD-10-CM | POA: Diagnosis not present

## 2021-05-14 DIAGNOSIS — S46091A Other injury of muscle(s) and tendon(s) of the rotator cuff of right shoulder, initial encounter: Secondary | ICD-10-CM | POA: Diagnosis not present

## 2021-05-14 DIAGNOSIS — M659 Synovitis and tenosynovitis, unspecified: Secondary | ICD-10-CM | POA: Diagnosis not present

## 2021-05-14 DIAGNOSIS — S45011A Laceration of axillary artery, right side, initial encounter: Secondary | ICD-10-CM | POA: Diagnosis not present

## 2021-05-14 DIAGNOSIS — X58XXXA Exposure to other specified factors, initial encounter: Secondary | ICD-10-CM | POA: Diagnosis not present

## 2021-05-14 DIAGNOSIS — S46121A Laceration of muscle, fascia and tendon of long head of biceps, right arm, initial encounter: Secondary | ICD-10-CM | POA: Diagnosis not present

## 2021-05-14 DIAGNOSIS — M24611 Ankylosis, right shoulder: Secondary | ICD-10-CM | POA: Diagnosis not present

## 2021-05-14 DIAGNOSIS — Y9323 Activity, snow (alpine) (downhill) skiing, snow boarding, sledding, tobogganing and snow tubing: Secondary | ICD-10-CM | POA: Diagnosis not present

## 2021-05-14 DIAGNOSIS — M7501 Adhesive capsulitis of right shoulder: Secondary | ICD-10-CM | POA: Diagnosis not present

## 2021-05-14 DIAGNOSIS — Z4889 Encounter for other specified surgical aftercare: Secondary | ICD-10-CM | POA: Diagnosis not present

## 2021-05-14 DIAGNOSIS — M65811 Other synovitis and tenosynovitis, right shoulder: Secondary | ICD-10-CM | POA: Diagnosis not present

## 2021-05-14 DIAGNOSIS — Y998 Other external cause status: Secondary | ICD-10-CM | POA: Diagnosis not present

## 2021-05-14 DIAGNOSIS — G8918 Other acute postprocedural pain: Secondary | ICD-10-CM | POA: Diagnosis not present

## 2021-05-14 DIAGNOSIS — M24111 Other articular cartilage disorders, right shoulder: Secondary | ICD-10-CM | POA: Diagnosis not present

## 2021-05-14 DIAGNOSIS — I890 Lymphedema, not elsewhere classified: Secondary | ICD-10-CM | POA: Diagnosis not present

## 2021-05-14 DIAGNOSIS — M25511 Pain in right shoulder: Secondary | ICD-10-CM | POA: Diagnosis not present

## 2021-05-24 DIAGNOSIS — M25511 Pain in right shoulder: Secondary | ICD-10-CM | POA: Diagnosis not present

## 2021-06-03 DIAGNOSIS — M25511 Pain in right shoulder: Secondary | ICD-10-CM | POA: Diagnosis not present

## 2021-06-03 DIAGNOSIS — S46091A Other injury of muscle(s) and tendon(s) of the rotator cuff of right shoulder, initial encounter: Secondary | ICD-10-CM | POA: Diagnosis not present

## 2021-06-09 DIAGNOSIS — M25511 Pain in right shoulder: Secondary | ICD-10-CM | POA: Diagnosis not present

## 2021-06-09 DIAGNOSIS — S46091A Other injury of muscle(s) and tendon(s) of the rotator cuff of right shoulder, initial encounter: Secondary | ICD-10-CM | POA: Diagnosis not present

## 2021-06-11 DIAGNOSIS — S46091A Other injury of muscle(s) and tendon(s) of the rotator cuff of right shoulder, initial encounter: Secondary | ICD-10-CM | POA: Diagnosis not present

## 2021-06-11 DIAGNOSIS — M25511 Pain in right shoulder: Secondary | ICD-10-CM | POA: Diagnosis not present

## 2021-06-14 DIAGNOSIS — S46091A Other injury of muscle(s) and tendon(s) of the rotator cuff of right shoulder, initial encounter: Secondary | ICD-10-CM | POA: Diagnosis not present

## 2021-06-14 DIAGNOSIS — M25511 Pain in right shoulder: Secondary | ICD-10-CM | POA: Diagnosis not present

## 2021-06-17 DIAGNOSIS — S46091A Other injury of muscle(s) and tendon(s) of the rotator cuff of right shoulder, initial encounter: Secondary | ICD-10-CM | POA: Diagnosis not present

## 2021-06-17 DIAGNOSIS — M25511 Pain in right shoulder: Secondary | ICD-10-CM | POA: Diagnosis not present

## 2021-06-21 DIAGNOSIS — S46091A Other injury of muscle(s) and tendon(s) of the rotator cuff of right shoulder, initial encounter: Secondary | ICD-10-CM | POA: Diagnosis not present

## 2021-06-21 DIAGNOSIS — M25511 Pain in right shoulder: Secondary | ICD-10-CM | POA: Diagnosis not present

## 2021-06-23 DIAGNOSIS — M25511 Pain in right shoulder: Secondary | ICD-10-CM | POA: Diagnosis not present

## 2021-06-23 DIAGNOSIS — S46091A Other injury of muscle(s) and tendon(s) of the rotator cuff of right shoulder, initial encounter: Secondary | ICD-10-CM | POA: Diagnosis not present

## 2021-06-29 DIAGNOSIS — M25511 Pain in right shoulder: Secondary | ICD-10-CM | POA: Diagnosis not present

## 2021-06-29 DIAGNOSIS — S46091A Other injury of muscle(s) and tendon(s) of the rotator cuff of right shoulder, initial encounter: Secondary | ICD-10-CM | POA: Diagnosis not present

## 2021-07-01 DIAGNOSIS — S46091A Other injury of muscle(s) and tendon(s) of the rotator cuff of right shoulder, initial encounter: Secondary | ICD-10-CM | POA: Diagnosis not present

## 2021-07-01 DIAGNOSIS — M25511 Pain in right shoulder: Secondary | ICD-10-CM | POA: Diagnosis not present

## 2021-07-07 DIAGNOSIS — M25511 Pain in right shoulder: Secondary | ICD-10-CM | POA: Diagnosis not present

## 2021-07-07 DIAGNOSIS — S46091A Other injury of muscle(s) and tendon(s) of the rotator cuff of right shoulder, initial encounter: Secondary | ICD-10-CM | POA: Diagnosis not present

## 2021-07-09 DIAGNOSIS — S46091A Other injury of muscle(s) and tendon(s) of the rotator cuff of right shoulder, initial encounter: Secondary | ICD-10-CM | POA: Diagnosis not present

## 2021-07-09 DIAGNOSIS — M25511 Pain in right shoulder: Secondary | ICD-10-CM | POA: Diagnosis not present

## 2021-07-13 DIAGNOSIS — S46091A Other injury of muscle(s) and tendon(s) of the rotator cuff of right shoulder, initial encounter: Secondary | ICD-10-CM | POA: Diagnosis not present

## 2021-07-13 DIAGNOSIS — M25511 Pain in right shoulder: Secondary | ICD-10-CM | POA: Diagnosis not present

## 2021-07-14 DIAGNOSIS — S46091A Other injury of muscle(s) and tendon(s) of the rotator cuff of right shoulder, initial encounter: Secondary | ICD-10-CM | POA: Diagnosis not present

## 2021-07-14 DIAGNOSIS — M25511 Pain in right shoulder: Secondary | ICD-10-CM | POA: Diagnosis not present

## 2021-07-20 DIAGNOSIS — S46091A Other injury of muscle(s) and tendon(s) of the rotator cuff of right shoulder, initial encounter: Secondary | ICD-10-CM | POA: Diagnosis not present

## 2021-07-20 DIAGNOSIS — M25511 Pain in right shoulder: Secondary | ICD-10-CM | POA: Diagnosis not present

## 2021-07-22 DIAGNOSIS — M25511 Pain in right shoulder: Secondary | ICD-10-CM | POA: Diagnosis not present

## 2021-07-22 DIAGNOSIS — S46091A Other injury of muscle(s) and tendon(s) of the rotator cuff of right shoulder, initial encounter: Secondary | ICD-10-CM | POA: Diagnosis not present

## 2021-07-26 ENCOUNTER — Other Ambulatory Visit: Payer: Self-pay | Admitting: Family Medicine

## 2021-07-26 NOTE — Telephone Encounter (Signed)
Last filled 02/08/2021 Last OV 04/09/2021  Ok to fill?

## 2021-07-27 DIAGNOSIS — M25511 Pain in right shoulder: Secondary | ICD-10-CM | POA: Diagnosis not present

## 2021-07-27 DIAGNOSIS — S46091A Other injury of muscle(s) and tendon(s) of the rotator cuff of right shoulder, initial encounter: Secondary | ICD-10-CM | POA: Diagnosis not present

## 2021-08-01 ENCOUNTER — Other Ambulatory Visit: Payer: Self-pay | Admitting: Internal Medicine

## 2021-08-01 DIAGNOSIS — I480 Paroxysmal atrial fibrillation: Secondary | ICD-10-CM

## 2021-08-02 ENCOUNTER — Other Ambulatory Visit: Payer: Self-pay | Admitting: Cardiovascular Disease

## 2021-08-03 NOTE — Telephone Encounter (Addendum)
Prescription refill request for Eliquis received. Indication: Afib  Last office visit: 09/11/20 ([facility])  Scr: 1.2 (12/16/2019) - appt scheduled for 09/17/21 and will have labs drawn  Age: 65 Weight: 90.7kg   Appropriate dose and refill sent to requested pharmacy.

## 2021-08-04 DIAGNOSIS — U071 COVID-19: Secondary | ICD-10-CM | POA: Diagnosis not present

## 2021-08-04 DIAGNOSIS — Z20822 Contact with and (suspected) exposure to covid-19: Secondary | ICD-10-CM | POA: Diagnosis not present

## 2021-08-11 ENCOUNTER — Other Ambulatory Visit: Payer: Self-pay | Admitting: Cardiovascular Disease

## 2021-08-31 DIAGNOSIS — S46091A Other injury of muscle(s) and tendon(s) of the rotator cuff of right shoulder, initial encounter: Secondary | ICD-10-CM | POA: Diagnosis not present

## 2021-08-31 DIAGNOSIS — M25511 Pain in right shoulder: Secondary | ICD-10-CM | POA: Diagnosis not present

## 2021-09-07 ENCOUNTER — Other Ambulatory Visit: Payer: Self-pay | Admitting: Cardiovascular Disease

## 2021-09-08 ENCOUNTER — Telehealth: Payer: Self-pay | Admitting: Cardiovascular Disease

## 2021-09-08 NOTE — Telephone Encounter (Signed)
I confirmed with dental office procedure is set for only 1 tooth to be extracted. I will update the pre op provider and Pharm-D.

## 2021-09-08 NOTE — Telephone Encounter (Signed)
   [facility] Pre-operative Risk Assessment    Request for surgical clearance:  What type of surgery is being performed? Tooth extraction    When is this surgery scheduled? 09/15/21  What type of clearance is required (medical clearance vs. Pharmacy clearance to hold med vs. Both)? Medical clearance   Are there any medications that need to be held prior to surgery and how long?apixaban (ELIQUIS) 5 MG TABS tablet   Practice name and name of physician performing surgery? Dr Arita Miss   What is your office phone number [phone]    7.   What is your office fax number [phone]  3.   Anesthesia type (None, local, MAC, general) ? local   Sean [name] 09/08/2021, 10:13 AM  _________________________________________________________________   (provider comments below)

## 2021-09-08 NOTE — Telephone Encounter (Signed)
Called the dental office and they are closed from 1-2 pm for lunch, will call back after 2pm

## 2021-09-08 NOTE — Telephone Encounter (Signed)
Recommend continuing Eliquis for single dental extraction. Pt does not require SBE prophylaxis.

## 2021-09-08 NOTE — Telephone Encounter (Signed)
Need to know how many teeth being extracted

## 2021-09-08 NOTE — Telephone Encounter (Signed)
   Patient Name: Sean [name]  DOB: Oct 29, 1956 MRN: [id number]  Primary Cardiologist: Dr. Eden Emms / Dr. Johney Frame  Chart reviewed as part of pre-operative protocol coverage.   Simple dental extractions are considered low risk procedures per guidelines and generally do not require any specific cardiac clearance. It is also generally accepted that for simple extractions and dental cleanings, there is no need to interrupt blood thinner therapy.  SBE prophylaxis is not required for the patient from a cardiac standpoint.  I will route this recommendation to the requesting party via Epic fax function and remove from pre-op pool.  Please call with questions.  [place], [place] 09/08/2021, 6:27 PM

## 2021-09-14 DIAGNOSIS — M25511 Pain in right shoulder: Secondary | ICD-10-CM | POA: Diagnosis not present

## 2021-09-14 DIAGNOSIS — S46091A Other injury of muscle(s) and tendon(s) of the rotator cuff of right shoulder, initial encounter: Secondary | ICD-10-CM | POA: Diagnosis not present

## 2021-09-15 ENCOUNTER — Other Ambulatory Visit: Payer: Self-pay | Admitting: Cardiovascular Disease

## 2021-09-17 ENCOUNTER — Ambulatory Visit: Payer: BC Managed Care – PPO | Admitting: Internal Medicine

## 2021-09-17 ENCOUNTER — Other Ambulatory Visit: Payer: Self-pay

## 2021-09-17 VITALS — BP 134/72 | HR 86 | Ht 74.0 in | Wt 201.2 lb

## 2021-09-17 DIAGNOSIS — I77810 Thoracic aortic ectasia: Secondary | ICD-10-CM | POA: Diagnosis not present

## 2021-09-17 DIAGNOSIS — Z79899 Other long term (current) drug therapy: Secondary | ICD-10-CM

## 2021-09-17 DIAGNOSIS — I4819 Other persistent atrial fibrillation: Secondary | ICD-10-CM | POA: Diagnosis not present

## 2021-09-17 LAB — BASIC METABOLIC PANEL
BUN/Creatinine Ratio: 15 (ref 10–24)
BUN: 14 mg/dL (ref 8–27)
CO2: 19 mmol/L — ABNORMAL LOW (ref 20–29)
Calcium: 9 mg/dL (ref 8.6–10.2)
Chloride: 105 mmol/L (ref 96–106)
Creatinine, Ser: 0.93 mg/dL (ref 0.76–1.27)
Glucose: 96 mg/dL (ref 70–99)
Potassium: 4.1 mmol/L (ref 3.5–5.2)
Sodium: 140 mmol/L (ref 134–144)
eGFR: 92 mL/min/{1.73_m2} (ref >59)

## 2021-09-17 LAB — CBC
Hematocrit: 43.6 % (ref 37.5–51.0)
Hemoglobin: 14.8 g/dL (ref 13.0–17.7)
MCH: 32.6 pg (ref 26.6–33.0)
MCHC: 33.9 g/dL (ref 31.5–35.7)
MCV: 96 fL (ref 79–97)
Platelets: 232 10*3/uL (ref 150–450)
RBC: 4.54 x10E6/uL (ref 4.14–5.80)
RDW: 12.2 % (ref 11.6–15.4)
WBC: 5.9 10*3/uL (ref 3.4–10.8)

## 2021-09-17 NOTE — Progress Notes (Signed)
   PCP: Kristian Covey, MD Primary Cardiologist: Dr Eden Emms Primary EP: Dr Johney Frame  Sean [name] is a 65 y.o. male who presents today for routine electrophysiology followup.  Since last being seen in our clinic, the patient reports doing very well.  Today, he denies symptoms of palpitations, chest pain, shortness of breath,  lower extremity edema, dizziness, presyncope, or syncope.  The patient is otherwise without complaint today.   Past Medical History:  Diagnosis Date   Atrial fibrillation [facility]    Past Surgical History:  Procedure Laterality Date   CARDIOVERSION N/A 10/01/2019   Procedure: CARDIOVERSION;  Surgeon: Nahser, Deloris Ping, MD;  Location: [facility] ENDOSCOPY;  Service: Cardiovascular;  Laterality: N/A;   CARDIOVERSION N/A 12/16/2019   Procedure: CARDIOVERSION;  Surgeon: [name], MD;  Location: [facility] ENDOSCOPY;  Service: Cardiovascular;  Laterality: N/A;   CATARACT EXTRACTION, BILATERAL     EYE SURGERY  07/30/2019    ROS- all systems are reviewed and negatives except as per HPI above  Current Outpatient Medications  Medication Sig Dispense Refill   apixaban (ELIQUIS) 5 MG TABS tablet TAKE 1 TABLET BY MOUTH TWICE A DAY 60 tablet 1   metoprolol succinate (TOPROL-XL) 25 MG 24 hr tablet TAKE 1 TAB BY MOUTH DAILY PLEASE MAKE APPT W/DR.NISHAN BEFORE ANYMORE REFILLS 2ND ATTEMPT 15 tablet 0   zolpidem (AMBIEN) 10 MG tablet TAKE 1 TABLET (10 MG TOTAL) BY MOUTH AT BEDTIME AS NEEDED. FOR SLEEP 30 tablet 0   No current facility-administered medications for this visit.    Physical Exam: Vitals:   09/17/21 0820  BP: 134/72  Pulse: 86  SpO2: 93%  Weight: 201 lb 3.2 oz (91.3 kg)  Height: 6\' 2"  (1.88 m)    GEN- The patient is well appearing, alert and oriented x 3 today.   Head- normocephalic, atraumatic Eyes-  Sclera clear, conjunctiva pink Ears- hearing intact Oropharynx- clear Lungs- Clear to ausculation bilaterally, normal work of breathing Heart- irregular rate and  rhythm  GI- soft, NT, ND, + BS Extremities- no clubbing, cyanosis, or edema  Wt Readings from Last 3 Encounters:  09/17/21 201 lb 3.2 oz (91.3 kg)  04/09/21 200 lb (90.7 kg)  02/08/21 205 lb 3.2 oz (93.1 kg)    EKG tracing ordered today is personally reviewed and shows afib, V rates 86 bpm  Assessment and Plan:  Persistent atrial fibrillation Rate controlled Asymptomatic Chads2vasc score is 0.  We discussed at length today  He wishes to continue eliquis.  Also not interested in rhythm control strategies currently.  Given paucity of symptoms, this is reasonable Bmet and cbc are ordered today  2. Mild to moderate AI, dilated aora Missed last appointment with Nish[facility] Echo and CTA are ordered  Follow-up with Dr 02/10/21 in 4-6 months  Eden Emms MD, [facility] 09/17/2021 8:37 AM

## 2021-09-17 NOTE — Patient Instructions (Addendum)
Medication Instructions:  Your physician recommends that you continue on your current medications as directed. Please refer to the Current Medication list given to you today. *If you need a refill on your cardiac medications before your next appointment, please call your pharmacy*  Lab Work: CBC, BMP If you have labs (blood work) drawn today and your tests are completely normal, you will receive your results only by: MyChart Message (if you have MyChart) OR A paper copy in the mail If you have any lab test that is abnormal or we need to change your treatment, we will call you to review the results.  Testing/Procedures: Your physician has requested that you have an echocardiogram. Echocardiography is a painless test that uses sound waves to create images of your heart. It provides your doctor with information about the size and shape of your heart and how well your heart's chambers and valves are working. This procedure takes approximately one hour. There are no restrictions for this procedure.  Non-Cardiac CT Angiography (CTA), is a special type of CT scan that uses a computer to produce multi-dimensional views of major blood vessels throughout the body. In CT angiography, a contrast material is injected through an IV to help visualize the blood vessels   Follow-Up: At [facility], you and your health needs are our priority.  As part of our continuing mission to provide you with exceptional heart care, we have created designated Provider Care Teams.  These Care Teams include your primary Cardiologist (physician) and Advanced Practice Providers (APPs -  Physician Assistants and Nurse Practitioners) who all work together to provide you with the care you need, when you need it.  Your physician wants you to follow-up in: 4 months with Dr. Eden Emms   We recommend signing up for the patient portal called "MyChart".  Sign up information is provided on this After Visit Summary.  MyChart is used to  connect with patients for Virtual Visits (Telemedicine).  Patients are able to view lab/test results, encounter notes, upcoming appointments, etc.  Non-urgent messages can be sent to your provider as well.   To learn more about what you can do with MyChart, go to [email or web address].    Any Other Special Instructions Will Be Listed Below (If Applicable).

## 2021-09-28 DIAGNOSIS — M25511 Pain in right shoulder: Secondary | ICD-10-CM | POA: Diagnosis not present

## 2021-09-28 DIAGNOSIS — S46091A Other injury of muscle(s) and tendon(s) of the rotator cuff of right shoulder, initial encounter: Secondary | ICD-10-CM | POA: Diagnosis not present

## 2021-10-03 ENCOUNTER — Other Ambulatory Visit: Payer: Self-pay | Admitting: Cardiovascular Disease

## 2021-10-04 ENCOUNTER — Other Ambulatory Visit: Payer: Self-pay | Admitting: Internal Medicine

## 2021-10-04 DIAGNOSIS — I480 Paroxysmal atrial fibrillation: Secondary | ICD-10-CM

## 2021-10-04 NOTE — Telephone Encounter (Signed)
Prescription refill request for Eliquis received. Indication: Afib  Last office visit: 09/17/21 ([facility])  Scr: 0.93 (09/17/21) Age: 65 Weight: 91.3kg  Appropriate dose and refill sent to requested pharmacy.

## 2021-10-12 ENCOUNTER — Ambulatory Visit (HOSPITAL_COMMUNITY): Payer: BC Managed Care – PPO | Attending: Cardiovascular Disease

## 2021-10-12 ENCOUNTER — Ambulatory Visit (INDEPENDENT_AMBULATORY_CARE_PROVIDER_SITE_OTHER)
Admission: RE | Admit: 2021-10-12 | Discharge: 2021-10-12 | Disposition: A | Discharge status: Home / Self Care | Payer: BC Managed Care – PPO | Source: Ambulatory Visit | Attending: Internal Medicine | Admitting: Internal Medicine

## 2021-10-12 ENCOUNTER — Other Ambulatory Visit: Payer: Self-pay

## 2021-10-12 DIAGNOSIS — I7121 Aneurysm of the ascending aorta, without rupture: Secondary | ICD-10-CM | POA: Diagnosis not present

## 2021-10-12 DIAGNOSIS — Z79899 Other long term (current) drug therapy: Secondary | ICD-10-CM | POA: Diagnosis not present

## 2021-10-12 DIAGNOSIS — I712 Thoracic aortic aneurysm, without rupture, unspecified: Secondary | ICD-10-CM | POA: Diagnosis not present

## 2021-10-12 DIAGNOSIS — I4819 Other persistent atrial fibrillation: Secondary | ICD-10-CM | POA: Diagnosis not present

## 2021-10-12 DIAGNOSIS — R911 Solitary pulmonary nodule: Secondary | ICD-10-CM | POA: Diagnosis not present

## 2021-10-12 DIAGNOSIS — I517 Cardiomegaly: Secondary | ICD-10-CM | POA: Diagnosis not present

## 2021-10-12 LAB — ECHOCARDIOGRAM COMPLETE
Area-P 1/2: 3.58 cm2
P 1/2 time: 438 msec
S' Lateral: 3.5 cm

## 2021-10-12 MED ORDER — IOHEXOL 350 MG/ML SOLN
100.0000 mL | Freq: Once | INTRAVENOUS | Status: AC | PRN
  Administered 2021-10-12: 100 mL via INTRAVENOUS

## 2021-10-25 ENCOUNTER — Telehealth: Payer: Self-pay | Admitting: Cardiovascular Disease

## 2021-10-25 DIAGNOSIS — I77810 Thoracic aortic ectasia: Secondary | ICD-10-CM

## 2021-10-25 NOTE — Telephone Encounter (Signed)
Patient wanted to discuss test results with Dr. Eden Emms or his Nurse

## 2021-10-25 NOTE — Telephone Encounter (Signed)
Wendall Stade, MD to Alois Cliche, RN    TTE ok with mild AR he has a thoracic aortic aneurysm 4.7 cm and will need f/u CTA in a year and f/u with CVTS  Dr Johney Frame ordered studies

## 2021-10-25 NOTE — Telephone Encounter (Signed)
Spoke with pt and advised Dr Eden Emms nor Dr Johney Frame are in the office today.  Pt requesting to review EKG and Cardiac CT results. Pt advised pt's last EKG shows AFIB as noted by Dr Johney Frame and Dr Johney Frame has sent his nurse a message re cardiac CT and she will be in touch to review his recommendations.  Current recommendation is referral to CV surgery and follow up with Dr Eden Emms.  Will aslo forward to Dr Eden Emms at pt's request as well.  Pt verbalizes understanding and thanked Charity fundraiser for the call.

## 2021-10-26 DIAGNOSIS — S46091A Other injury of muscle(s) and tendon(s) of the rotator cuff of right shoulder, initial encounter: Secondary | ICD-10-CM | POA: Diagnosis not present

## 2021-10-26 DIAGNOSIS — M25511 Pain in right shoulder: Secondary | ICD-10-CM | POA: Diagnosis not present

## 2021-10-27 ENCOUNTER — Ambulatory Visit (INDEPENDENT_AMBULATORY_CARE_PROVIDER_SITE_OTHER): Payer: BC Managed Care – PPO | Admitting: Family Medicine

## 2021-10-27 ENCOUNTER — Encounter: Payer: Self-pay | Admitting: Family Medicine

## 2021-10-27 VITALS — BP 120/80 | HR 108 | Temp 98.1°F | Ht 72.44 in | Wt 200.0 lb

## 2021-10-27 DIAGNOSIS — Z0000 Encounter for general adult medical examination without abnormal findings: Secondary | ICD-10-CM

## 2021-10-27 LAB — CBC WITH DIFFERENTIAL/PLATELET
Basophils Absolute: 0 10*3/uL (ref 0.0–0.1)
Basophils Relative: 0.9 % (ref 0.0–3.0)
Eosinophils Absolute: 0.1 10*3/uL (ref 0.0–0.7)
Eosinophils Relative: 1.6 % (ref 0.0–5.0)
HCT: 46.5 % (ref 39.0–52.0)
Hemoglobin: 15.4 g/dL (ref 13.0–17.0)
Lymphocytes Relative: 37.7 % (ref 12.0–46.0)
Lymphs Abs: 2.1 10*3/uL (ref 0.7–4.0)
MCHC: 33 g/dL (ref 30.0–36.0)
MCV: 99.5 fl (ref 78.0–100.0)
Monocytes Absolute: 0.6 10*3/uL (ref 0.1–1.0)
Monocytes Relative: 10.3 % (ref 3.0–12.0)
Neutro Abs: 2.7 10*3/uL (ref 1.4–7.7)
Neutrophils Relative %: 49.5 % (ref 43.0–77.0)
Platelets: 207 10*3/uL (ref 150.0–400.0)
RBC: 4.67 Mil/uL (ref 4.22–5.81)
RDW: 13.3 % (ref 11.5–15.5)
WBC: 5.5 10*3/uL (ref 4.0–10.5)

## 2021-10-27 LAB — LIPID PANEL
Cholesterol: 203 mg/dL — ABNORMAL HIGH (ref 0–200)
HDL: 63.7 mg/dL (ref >39.00)
LDL Cholesterol: 124 mg/dL — ABNORMAL HIGH (ref 0–99)
NonHDL: 139.78
Total CHOL/HDL Ratio: 3
Triglycerides: 77 mg/dL (ref 0.0–149.0)
VLDL: 15.4 mg/dL (ref 0.0–40.0)

## 2021-10-27 LAB — PSA: PSA: 3.65 ng/mL (ref 0.10–4.00)

## 2021-10-27 LAB — TSH: TSH: 2.75 u[IU]/mL (ref 0.35–5.50)

## 2021-10-27 LAB — BASIC METABOLIC PANEL
BUN: 17 mg/dL (ref 6–23)
CO2: 24 mEq/L (ref 19–32)
Calcium: 9.8 mg/dL (ref 8.4–10.5)
Chloride: 103 mEq/L (ref 96–112)
Creatinine, Ser: 1.04 mg/dL (ref 0.40–1.50)
GFR: 75.81 mL/min (ref >60.00)
Glucose, Bld: 101 mg/dL — ABNORMAL HIGH (ref 70–99)
Potassium: 4.2 mEq/L (ref 3.5–5.1)
Sodium: 137 mEq/L (ref 135–145)

## 2021-10-27 LAB — HEPATIC FUNCTION PANEL
ALT: 48 U/L (ref 0–53)
AST: 37 U/L (ref 0–37)
Albumin: 4.6 g/dL (ref 3.5–5.2)
Alkaline Phosphatase: 54 U/L (ref 39–117)
Bilirubin, Direct: 0.2 mg/dL (ref 0.0–0.3)
Total Bilirubin: 0.9 mg/dL (ref 0.2–1.2)
Total Protein: 7.2 g/dL (ref 6.0–8.3)

## 2021-10-27 MED ORDER — ZOLPIDEM TARTRATE 10 MG PO TABS: 10.0000 mg | ORAL_TABLET | Freq: Every evening | ORAL | 1 refills | Status: DC | PRN

## 2021-10-27 NOTE — Telephone Encounter (Signed)
The patient has been notified of the result and verbalized understanding.  All questions (if any) were answered. Sampson Goon, RN 10/27/2021 8:42 AM

## 2021-10-27 NOTE — Progress Notes (Signed)
Established Patient Office Visit  Subjective:  Patient ID: Sean [name], male    DOB: 25-Sep-1956  Age: 65 y.o. MRN: [id number]  CC:  Chief Complaint  Patient presents with   Annual Exam    HPI ACEA YAGI presents for physical exam.  He has history of chronic atrial fibrillation and is maintained on Eliquis and metoprolol.  Followed by cardiology.  He had recent CT angiogram of the chest which showed fusiform aneurysmal dilatation of the ascending aorta 47 mm.  He has pending follow-up with cardiothoracic surgery.  There is also mention of aortic atherosclerosis.  Suspected hepatic steatosis.  2 mm left lower lobe nodule.  Patient quit smoking years ago.  He quit around age 3.  Less than 20-pack-year history.  Health maintenance reviewed:  -Flu vaccine already given -Shingles vaccine completed -Prior hepatitis C screen negative -Colonoscopy due 5/24 -Has had previous COVID vaccinations  Family history-Father died age 75 unknown cause.  Possible stroke or coronary issue.  Cause of death unknown.  Mother had colon cancer.  He had a brother that died of testicular cancer complications age 27.  Social history-married.  Works as a Chief Executive Officer.  Quit smoking cigarettes age 76.  Occasional cigar use.  No consistent alcohol use.  Past Medical History:  Diagnosis Date   Atrial fibrillation [facility])     Past Surgical History:  Procedure Laterality Date   CARDIOVERSION N/A 10/01/2019   Procedure: CARDIOVERSION;  Surgeon: Nahser, Wonda Cheng, MD;  Location: [facility] ENDOSCOPY;  Service: Cardiovascular;  Laterality: N/A;   CARDIOVERSION N/A 12/16/2019   Procedure: CARDIOVERSION;  Surgeon: Elouise Munroe, MD;  Location: [facility] ENDOSCOPY;  Service: Cardiovascular;  Laterality: N/A;   CATARACT EXTRACTION, BILATERAL     EYE SURGERY  07/30/2019    Family History  Problem Relation Age of Onset   Colon cancer Mother    Cancer Brother 44       testicular cancer    Social History   Socioeconomic  History   Marital status: Married    Spouse name: Not on file   Number of children: Not on file   Years of education: Not on file   Highest education level: Not on file  Occupational History   Not on file  Tobacco Use   Smoking status: Some Days    Types: Cigars   Smokeless tobacco: Never  Vaping Use   Vaping Use: Never used  Substance and Sexual Activity   Alcohol use: Yes    Alcohol/week: 4.0 - 5.0 standard drinks    Types: 4 - 5 Cans of beer per week   Drug use: No   Sexual activity: Not on file  Other Topics Concern   Not on file  Social History Narrative   Lives in [place] with wife and kids.   He is an Forensic psychologist.      Social Determinants of Health   Financial Resource Strain: Not on file  Food Insecurity: Not on file  Transportation Needs: Not on file  Physical Activity: Not on file  Stress: Not on file  Social Connections: Not on file  Intimate Partner Violence: Not on file    Outpatient Medications Prior to Visit  Medication Sig Dispense Refill   apixaban (ELIQUIS) 5 MG TABS tablet TAKE 1 TABLET BY MOUTH TWICE A DAY 60 tablet 2   metoprolol succinate (TOPROL-XL) 25 MG 24 hr tablet TAKE 1 TAB BY MOUTH DAILY PLEASE MAKE APPT. 90 tablet 1   zolpidem (AMBIEN) 10 MG  tablet TAKE 1 TABLET (10 MG TOTAL) BY MOUTH AT BEDTIME AS NEEDED. FOR SLEEP 30 tablet 0   No facility-administered medications prior to visit.    No Known Allergies  ROS Review of Systems  Constitutional:  Negative for activity change, appetite change, fatigue and fever.  HENT:  Negative for congestion, ear pain and trouble swallowing.   Eyes:  Negative for pain and visual disturbance.  Respiratory:  Negative for cough, shortness of breath and wheezing.   Cardiovascular:  Negative for chest pain and palpitations.  Gastrointestinal:  Negative for abdominal distention, abdominal pain, blood in stool, constipation, diarrhea, nausea, rectal pain and vomiting.  Genitourinary:  Negative for dysuria,  hematuria and testicular pain.  Musculoskeletal:  Negative for arthralgias and joint swelling.  Skin:  Negative for rash.  Neurological:  Negative for dizziness, syncope and headaches.  Hematological:  Negative for adenopathy.  Psychiatric/Behavioral:  Negative for confusion and dysphoric mood.      Objective:    Physical Exam Constitutional:      General: He is not in acute distress.    Appearance: He is well-developed.  HENT:     Head: Normocephalic and atraumatic.     Right Ear: External ear normal.     Left Ear: External ear normal.  Eyes:     Conjunctiva/sclera: Conjunctivae normal.     Pupils: Pupils are equal, round, and reactive to light.  Neck:     Thyroid: No thyromegaly.     Comments: He has a small rounded nontender mobile cyst right submandibular area.  Dermatologist plans to remove this soon. Cardiovascular:     Rate and Rhythm: Normal rate and regular rhythm.     Heart sounds: Normal heart sounds. No murmur heard. Pulmonary:     Effort: No respiratory distress.     Breath sounds: No wheezing or rales.  Abdominal:     General: Bowel sounds are normal. There is no distension.     Palpations: Abdomen is soft.     Tenderness: There is no abdominal tenderness. There is no guarding or rebound.     Comments: Has a ventral and umbilical hernia which are soft and nontender.  Musculoskeletal:     Cervical back: Normal range of motion and neck supple.     Right lower leg: No edema.     Left lower leg: No edema.  Lymphadenopathy:     Cervical: No cervical adenopathy.  Skin:    Findings: No rash.  Neurological:     Mental Status: He is alert and oriented to person, place, and time.     Cranial Nerves: No cranial nerve deficit.     Deep Tendon Reflexes: Reflexes normal.    There were no vitals taken for this visit. Wt Readings from Last 3 Encounters:  09/17/21 201 lb 3.2 oz (91.3 kg)  04/09/21 200 lb (90.7 kg)  02/08/21 205 lb 3.2 oz (93.1 kg)     Health  Maintenance Due  Topic Date Due   Pneumococcal Vaccine 47-36 Years old (1 - PCV) Never done   TETANUS/TDAP  11/28/2016   COVID-19 Vaccine (2 - Pfizer risk series) 10/19/2020    There are no preventive care reminders to display for this patient.  Lab Results  Component Value Date   TSH 1.98 07/31/2019   Lab Results  Component Value Date   WBC 5.9 09/17/2021   HGB 14.8 09/17/2021   HCT 43.6 09/17/2021   MCV 96 09/17/2021   PLT 232 09/17/2021   Lab  Results  Component Value Date   NA 140 09/17/2021   K 4.1 09/17/2021   CO2 19 (L) 09/17/2021   GLUCOSE 96 09/17/2021   BUN 14 09/17/2021   CREATININE 0.93 09/17/2021   BILITOT 0.5 07/31/2019   ALKPHOS 59 07/31/2019   AST 38 (H) 07/31/2019   ALT 42 07/31/2019   PROT 6.5 07/31/2019   ALBUMIN 4.1 07/31/2019   CALCIUM 9.0 09/17/2021   EGFR 92 09/17/2021   GFR 67.74 07/31/2019   Lab Results  Component Value Date   CHOL 178 08/31/2018   Lab Results  Component Value Date   HDL 56.80 08/31/2018   Lab Results  Component Value Date   LDLCALC 88 08/31/2018   Lab Results  Component Value Date   TRIG 167.0 (H) 08/31/2018   Lab Results  Component Value Date   CHOLHDL 3 08/31/2018   No results found for: HGBA1C    Assessment & Plan:   Problem List Items Addressed This Visit   None Visit Diagnoses     Physical exam    -  Primary   Relevant Orders   Basic metabolic panel   Lipid panel   CBC with Differential/Platelet   TSH   Hepatic function panel   PSA     -Continue annual flu vaccine -Patient has follow-up with cardiothoracic surgery regarding a sending thoracic aortic aneurysm. -We discussed 2 mm nodule of the lung.  Recommend follow-up within a year to re-image -Repeat colonoscopy in 2 years -Recommend Prevnar 34 at age 72 -Obtain screening labs as above -Continue regular exercise habits  Meds ordered this encounter  Medications   zolpidem (AMBIEN) 10 MG tablet    Sig: Take 1 tablet (10 mg total) by  mouth at bedtime as needed. for sleep    Dispense:  30 tablet    Refill:  1    Not to exceed 5 additional fills before 08/07/2021.    Follow-up: Return in about 1 year (around 10/27/2022).    Carolann Littler, MD

## 2021-11-09 ENCOUNTER — Institutional Professional Consult (permissible substitution): Payer: BC Managed Care – PPO | Admitting: Thoracic Surgery (Cardiothoracic Vascular Surgery)

## 2021-11-09 ENCOUNTER — Encounter: Payer: Self-pay | Admitting: Thoracic Surgery (Cardiothoracic Vascular Surgery)

## 2021-11-09 ENCOUNTER — Other Ambulatory Visit: Payer: Self-pay

## 2021-11-09 VITALS — BP 128/87 | HR 88 | Resp 20 | Ht 73.0 in | Wt 200.0 lb

## 2021-11-09 DIAGNOSIS — I7121 Aneurysm of the ascending aorta, without rupture: Secondary | ICD-10-CM | POA: Diagnosis not present

## 2021-11-09 NOTE — Progress Notes (Signed)
PCP is Eulas Post, MD Referring Provider is Thompson Grayer, MD/ Joie Bimler, MD  Chief Complaint  Patient presents with   Thoracic Aortic Aneurysm    Surgical consult, Chest CTA and ECHO 10/12/21    HPI: Sean [name] sent for consultation regarding an ascending aortic aneurysm.  Sean [name] is a 65 year old man with a past medical history significant for atrial fibrillation who was found to have a dilated aortic root and ascending aorta on an echocardiogram.  He has been followed by Drs. Jenkins Rouge and Thompson Grayer of cardiology.  He has been feeling well recently with no issues with chest pain, pressure, tightness, or shortness of breath.  He works out on a regular basis.  He checks his blood pressure when he works out and has not had any issues with hypertension.   Past Medical History:  Diagnosis Date   Atrial fibrillation [facility])     Past Surgical History:  Procedure Laterality Date   CARDIOVERSION N/A 10/01/2019   Procedure: CARDIOVERSION;  Surgeon: Nahser, Wonda Cheng, MD;  Location: [facility] ENDOSCOPY;  Service: Cardiovascular;  Laterality: N/A;   CARDIOVERSION N/A 12/16/2019   Procedure: CARDIOVERSION;  Surgeon: Elouise Munroe, MD;  Location: [facility] ENDOSCOPY;  Service: Cardiovascular;  Laterality: N/A;   CATARACT EXTRACTION, BILATERAL     EYE SURGERY  07/30/2019    Family History  Problem Relation Age of Onset   Colon cancer Mother    Cancer Brother 55       testicular cancer    Social History Social History   Tobacco Use   Smoking status: Some Days    Types: Cigars   Smokeless tobacco: Never  Vaping Use   Vaping Use: Never used  Substance Use Topics   Alcohol use: Yes    Alcohol/week: 4.0 - 5.0 standard drinks    Types: 4 - 5 Cans of beer per week   Drug use: No    Current Outpatient Medications  Medication Sig Dispense Refill   apixaban (ELIQUIS) 5 MG TABS tablet TAKE 1 TABLET BY MOUTH TWICE A DAY 60 tablet 2   metoprolol succinate (TOPROL-XL) 25 MG 24  hr tablet TAKE 1 TAB BY MOUTH DAILY PLEASE MAKE APPT. 90 tablet 1   zolpidem (AMBIEN) 10 MG tablet Take 1 tablet (10 mg total) by mouth at bedtime as needed. for sleep 30 tablet 1   No current facility-administered medications for this visit.    No Known Allergies  Review of Systems  Constitutional:  Negative for activity change and unexpected weight change.  HENT:  Negative for trouble swallowing and voice change.   Respiratory:  Negative for cough, shortness of breath and wheezing.   Cardiovascular:  Negative for chest pain and leg swelling.       Atrial fibrillation  Genitourinary:  Negative for difficulty urinating and dysuria.  Musculoskeletal:        Right rotator cuff injury  Hematological:  Negative for adenopathy. Does not bruise/bleed easily.  Psychiatric/Behavioral:  Negative for dysphoric mood. The patient is not nervous/anxious.   All other systems reviewed and are negative.  BP 128/87    Pulse 88    Resp 20    Ht 6\' 1"  (1.854 m)    Wt 200 lb (90.7 kg)    SpO2 98% Comment: RA   BMI 26.39 kg/m  Physical Exam Vitals reviewed.  Constitutional:      General: He is not in acute distress.    Appearance: Normal appearance.  HENT:  Head: Normocephalic and atraumatic.  Eyes:     General: No scleral icterus.    Extraocular Movements: Extraocular movements intact.  Neck:     Vascular: No carotid bruit.  Cardiovascular:     Rate and Rhythm: Rhythm irregular.     Heart sounds: No murmur heard.   No friction rub. No gallop.  Pulmonary:     Effort: Pulmonary effort is normal. No respiratory distress.     Breath sounds: Normal breath sounds. No wheezing or rales.  Abdominal:     General: There is no distension.     Palpations: Abdomen is soft. There is no mass.     Tenderness: There is no abdominal tenderness.  Musculoskeletal:     Right lower leg: No edema.     Left lower leg: No edema.  Lymphadenopathy:     Cervical: No cervical adenopathy.  Skin:    General:  Skin is warm and dry.  Neurological:     General: No focal deficit present.     Mental Status: He is alert and oriented to person, place, and time.     Cranial Nerves: No cranial nerve deficit.     Motor: No weakness.     Diagnostic Tests: CT ANGIOGRAPHY CHEST WITH CONTRAST   TECHNIQUE: Multidetector CT imaging of the chest was performed using the standard protocol during bolus administration of intravenous contrast. Multiplanar CT image reconstructions and MIPs were obtained to evaluate the vascular anatomy.   CONTRAST:  15mL OMNIPAQUE IOHEXOL 350 MG/ML SOLN   COMPARISON:  None.   FINDINGS: Vascular Findings:   Fusiform aneurysmal dilatation of the ascending thoracic aorta with measurements as follows. The thoracic aorta tapers to a normal caliber at the level of the aortic arch. Normal caliber of the descending thoracic aorta. There is a minimal amount of calcified atherosclerotic plaque involving the aortic arch and proximal descending thoracic aorta, not resulting in a hemodynamically significant stenosis. No evidence of thoracic aortic dissection or perivascular stranding on this nongated examination.   Conventional configuration of the aortic arch. The branch vessels of the aortic arch appear patent throughout their imaged courses. Note is made of a small ductus diverticulum.   Cardiomegaly.  No pericardial effusion.   Although this examination was not tailored for the evaluation the pulmonary arteries, there are no discrete filling defects within the central pulmonary arterial tree to suggest central pulmonary embolism. Normal caliber of the main pulmonary artery.   The celiac artery is noted to give rise to the splenic artery as there is a replaced common hepatic artery arising from the proximal SMA. Note is also made of an accessory left hepatic artery which arises from the left gastric artery.    -------------------------------------------------------------   Thoracic aortic measurements:   SINOTUBULAR JUNCTION: 38 mm as measured in greatest oblique short axis coronal dimension.   PROXIMAL ASCENDING THORACIC AORTA: 47 mm as measured in greatest oblique short axis axial dimension at the level of the main pulmonary artery (image 50, series 4) and approximately 47 mm in greatest oblique short axis coronal diameter (coronal image 47, series 7).   AORTIC ARCH: 32 mm as measured in greatest oblique short axis sagittal dimension.   PROXIMAL DESCENDING THORACIC AORTA: 30 mm as measured in greatest oblique short axis axial dimension at the level of the main pulmonary artery.   DISTAL DESCENDING THORACIC AORTA: 28 mm as measured in greatest oblique short axis axial dimension at the level of the diaphragmatic hiatus.   Review of the  MIP images confirms the above findings.   -------------------------------------------------------------   Non-Vascular Findings:   Mediastinum/Lymph Nodes: No bulky mediastinal, hilar or axillary lymphadenopathy.   Lungs/Pleura: No focal airspace opacities. No pleural effusion or pneumothorax. The central pulmonary airways appear widely patent. There is a punctate (2 mm) left lower lobe pulmonary nodule (image 86, series 5). No additional discrete pulmonary nodules are identified.   Upper abdomen: Limited early arterial phase evaluation of the upper abdomen suggests mild diffuse decreased attenuation hepatic parenchyma on this postcontrast examination suggestive of hepatic steatosis.   Musculoskeletal: No acute or aggressive osseous abnormalities. Straightening of the expected thoracic kyphosis regional soft tissues appear normal. Normal appearance of the thyroid gland.   IMPRESSION: 1. Fusiform aneurysmal dilatation of the ascending thoracic aorta measuring 47 mm in diameter. Ascending thoracic aortic aneurysm. Recommend semi-annual  imaging followup by CTA or MRA and referral to cardiothoracic surgery if not already obtained. This recommendation follows 2010 ACCF/AHA/AATS/ACR/ASA/SCA/SCAI/SIR/STS/SVM Guidelines for the Diagnosis and Management of Patients With Thoracic Aortic Disease. Circulation. 2010; 121: G867-Y195. Aortic aneurysm NOS (ICD10-I71.9) 2. Cardiomegaly. 3.  Aortic Atherosclerosis (ICD10-I70.0). 4. Suspected hepatic steatosis.  Correlation with LFTs is advised. 5. Solitary punctate (2 mm) left lower lobe nodule. No follow-up needed if patient is low-risk. Non-contrast chest CT can be considered in 12 months if patient is high-risk. This recommendation follows the consensus statement: Guidelines for Management of Incidental Pulmonary Nodules Detected on CT Images: From the Fleischner Society 2017; Radiology 2017; 284:228-243.     Electronically Signed   By: Simonne Come M.D.   On: 10/12/2021 10:49 I personally reviewed the CT images.  There is a 4.7 cm ascending aneurysm.  Normal aortic root and sinotubular junction anatomy.  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  By echo he has mild aortic insufficiency with no aortic stenosis.  I reviewed the CT images with Sean [name].  We discussed the natural history of aortic aneurysms and risk of such things as dissection or rupture.  We discussed the importance of blood pressure control.  No indication for surgery at the present time.  He needs to be followed at 46-month intervals.  We will plan to start with CT angios of the chest as we can follow the aneurysm as well as the 2 mm left lower lobe lung nodule.  May be able to transition to MR angiogram down the road.  His blood pressure is well controlled on metoprolol.  He understands the importance of monitoring that.  He was advised to avoid taking quinolone antibiotics due to the  association with rupture or dissection with aneurysms.  He understands if that if the quinolone was the only alternative that could be used.  Advised to avoid power lifting type activities.  Encouraged to participate in cardio activities as tolerated.  Plan: Return in 6 months with CT angiogram of chest to follow-up ascending aneurysm and left lower lobe lung nodule  Loreli Slot, MD [facility] [phone]

## 2021-11-11 DIAGNOSIS — L720 Epidermal cyst: Secondary | ICD-10-CM | POA: Diagnosis not present

## 2021-12-28 DIAGNOSIS — C44319 Basal cell carcinoma of skin of other parts of face: Secondary | ICD-10-CM | POA: Diagnosis not present

## 2021-12-28 DIAGNOSIS — C4401 Basal cell carcinoma of skin of lip: Secondary | ICD-10-CM | POA: Diagnosis not present

## 2022-01-07 ENCOUNTER — Other Ambulatory Visit: Payer: Self-pay | Admitting: Internal Medicine

## 2022-01-07 DIAGNOSIS — I480 Paroxysmal atrial fibrillation: Secondary | ICD-10-CM

## 2022-01-07 NOTE — Telephone Encounter (Signed)
Eliquis 5 mg refill request received. Patient is 66 years old, weight- 90.7kg, Crea- 1.04 on 10/27/21, Diagnosis- afib and last seen by Dr. Johney Frame on 09/17/21. Dose is appropriate based on dosing criteria. Will send in refill to requested pharmacy.

## 2022-01-31 NOTE — Progress Notes (Unsigned)
CARDIOLOGY OFFICE NOTE  Date:  01/31/2022    [name] Date of Birth: [birth year] Medical Record [id number]  PCP:  Eulas Post, MD  Cardiologist:  Andrez Grime chief complaint on file.   History of Present Illness: Sean [name] is a 66 y.o. male first seen 08/28/19 for new onset PAF   CHADSVASC of 0. Not symptomatic. Has cut back alcohol use one glass red wine in evening . Successful [facility] 10/01/19 with relapse And repeat [facility] on flecainide 12/16/19 with early failure again Seen by [facility] 03/02/20 and ablation or alternative AAT deferred   Echo 09/06/19  EF 55-60% normal atrial size Mild/Moderate AR Trace MR  Myovue 10/21/19 non ischemia EF not accurate due to afib and normal by echo   He is still able to ride bike 1.5 hours on [address] Traveling to [place]/ [place] may do some scuba diving  Discussed starting beta blocker for some rate control He notes HR a bit high with exercise      The patient does not have symptoms concerning for COVID-19 infection (fever, chills, cough, or new shortness of breath).     Past Medical History:  Diagnosis Date   Atrial fibrillation [facility])     Past Surgical History:  Procedure Laterality Date   CARDIOVERSION N/A 10/01/2019   Procedure: CARDIOVERSION;  Surgeon: Nahser, Wonda Cheng, MD;  Location: [place];  Service: Cardiovascular;  Laterality: N/A;   CARDIOVERSION N/A 12/16/2019   Procedure: CARDIOVERSION;  Surgeon: Elouise Munroe, MD;  Location: [place];  Service: Cardiovascular;  Laterality: N/A;   CATARACT EXTRACTION, BILATERAL     EYE SURGERY  07/30/2019     Medications: No outpatient medications have been marked as taking for the 02/14/22 encounter (Appointment) with Josue Hector, MD.     Allergies: Allergies  Allergen Reactions   Quinolones     Aortic aneurysm    Social History: The patient  reports that he has been smoking cigars. He has never used smokeless tobacco. He reports current alcohol  use of about 4.0 - 5.0 standard drinks per week. He reports that he does not use drugs.   Family History: The patient's family history includes Cancer (age of onset: 63) in his brother; Colon cancer in his mother. His father died at 22 - had sudden death - no autopsy. Mother died at 75 with colon cancer.   Review of Systems: Please see the history of present illness.   All other systems are reviewed and negative.   Physical Exam: VS:  There were no vitals taken for this visit. Marland Kitchen  BMI There is no height or weight on file to calculate BMI.  Wt Readings from Last 3 Encounters:  11/09/21 200 lb (90.7 kg)  10/27/21 200 lb (90.7 kg)  09/17/21 201 lb 3.2 oz (91.3 kg)    Affect appropriate Healthy:  appears stated age 89: normal Neck supple with no adenopathy JVP normal no bruits no thyromegaly Lungs clear with no wheezing and good diaphragmatic motion Heart:  S1/S2 AR  murmur, no rub, gallop or click PMI normal Abdomen: benighn, BS positve, no tenderness, no AAA no bruit.  No HSM or HJR Distal pulses intact with no bruits No edema Neuro non-focal Skin warm and dry No muscular weakness    LABORATORY DATA:  EKG:  12/24/19 afibr/flutter rate 92 otherwise normal   Lab Results  Component Value Date   WBC 5.5 10/27/2021   HGB 15.4 10/27/2021   HCT 46.5  10/27/2021   PLT 207.0 10/27/2021   GLUCOSE 101 (H) 10/27/2021   CHOL 203 (H) 10/27/2021   TRIG 77.0 10/27/2021   HDL 63.70 10/27/2021   LDLDIRECT 137.4 10/15/2010   LDLCALC 124 (H) 10/27/2021   ALT 48 10/27/2021   AST 37 10/27/2021   NA 137 10/27/2021   K 4.2 10/27/2021   CL 103 10/27/2021   CREATININE 1.04 10/27/2021   BUN 17 10/27/2021   CO2 24 10/27/2021   TSH 2.75 10/27/2021   PSA 3.65 10/27/2021   INR 1.1 09/25/2019     BNP (last 3 results) No results for input(s): BNP in the last 8760 hours.  ProBNP (last 3 results) No results for input(s): PROBNP in the last 8760 hours.   Other Studies Reviewed  Today:  ECHO IMPRESSIONS 10/12/21   IMPRESSIONS     1. Left ventricular ejection fraction, by estimation, is 55 to 60%. The  left ventricle has normal function. The left ventricle has no regional  wall motion abnormalities. There is mild concentric left ventricular  hypertrophy. Left ventricular diastolic  function could not be evaluated.   2. Right ventricular systolic function is normal. The right ventricular  size is normal. There is normal pulmonary artery systolic pressure. The  estimated right ventricular systolic pressure is 0000000 mmHg.   3. Left atrial size was moderately dilated.   4. Right atrial size was moderately dilated.   5. The mitral valve is normal in structure. Mild to moderate mitral valve  regurgitation. No evidence of mitral stenosis.   6. Tricuspid valve regurgitation is mild to moderate.   7. The aortic valve is normal in structure. Aortic valve regurgitation is  mild. No aortic stenosis is present.   8. There is mild dilatation of the aortic root, measuring 40 mm. There is  mild dilatation of the ascending aorta, measuring 42 mm.   9. The inferior vena cava is normal in size with greater than 50%  respiratory variability, suggesting right atrial pressure of 3 mmHg.    Assessment/Plan:  1. Atrial fib - persistent - has normal atrial size - successful [facility] 10/01/19 CHADVASC 0 ERA Repeat DCC done 12/16/19 on flecainide with again early failure Seen by Dr Rayann Heman 03/02/20 deferred ablation Flecainide caused severe headache Start Toprol 25 mg daily for rate control strategy   2. Anticoagulation -  No bleeding issues on eliquis  Despite CHADVASC  0 he prefers to stay on Eliquis   3. 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   4. Dilated aorta -  His father died with sudden death - this a presumed MI. Precautions were discussed (Bp control, no Valsalva, no Fluoroquinolones).    5. Mild to moderate AI - f/u echo 08/2020  CTA 10/12/21 stable 4.7 cm  Seen  by Dr Roxan Hockey 10/2021 who will follow   5. Aortic Regurgitation:  tri leaflet AV in setting of aneurysm mild by TTE 10/12/21   6. HTN:  Well controlled continue beta blocker    F/U in a year F/U [facility] for CTA/MRA imaging of aorta   Signed: Jenkins Rouge, MD  01/31/2022 9:47 AM  [facility] [address] [place], [place]  [address] Phone: [phone] Fax: [phone]

## 2022-02-14 ENCOUNTER — Other Ambulatory Visit: Payer: Self-pay

## 2022-02-14 ENCOUNTER — Encounter: Payer: Self-pay | Admitting: Cardiovascular Disease

## 2022-02-14 ENCOUNTER — Ambulatory Visit (INDEPENDENT_AMBULATORY_CARE_PROVIDER_SITE_OTHER): Payer: BC Managed Care – PPO | Admitting: Cardiovascular Disease

## 2022-02-14 VITALS — BP 102/62 | HR 92 | Ht 73.0 in | Wt 209.0 lb

## 2022-02-14 DIAGNOSIS — I4819 Other persistent atrial fibrillation: Secondary | ICD-10-CM

## 2022-02-14 DIAGNOSIS — I77810 Thoracic aortic ectasia: Secondary | ICD-10-CM | POA: Diagnosis not present

## 2022-02-14 DIAGNOSIS — Z79899 Other long term (current) drug therapy: Secondary | ICD-10-CM | POA: Diagnosis not present

## 2022-02-14 NOTE — Patient Instructions (Addendum)
Medication Instructions:  ?Your physician recommends that you continue on your current medications as directed. Please refer to the Current Medication list given to you today. ? ?Labwork: ?NONE ? ?Testing/Procedures: ?NONE ? ?Follow-Up: ?Your physician wants you to follow-up in: 6 months with Dr. Eden Emms. You will receive a reminder letter in the mail two months in advance. If you don't receive a letter, please call our office to schedule the follow-up appointment. ? ? ?If you need a refill on your cardiac medications before your next appointment, please call your pharmacy. ? ? ?Your physician recommends that you schedule a follow-up appointment next available with Dr. Lalla Brothers to discuss ablation.  ? ?

## 2022-03-16 ENCOUNTER — Encounter: Payer: Self-pay | Admitting: Family Medicine

## 2022-03-16 ENCOUNTER — Ambulatory Visit (INDEPENDENT_AMBULATORY_CARE_PROVIDER_SITE_OTHER): Payer: Medicare Other | Admitting: Family Medicine

## 2022-03-16 VITALS — BP 130/80 | HR 91 | Temp 98.4°F | Ht 73.0 in | Wt 204.5 lb

## 2022-03-16 DIAGNOSIS — Z23 Encounter for immunization: Secondary | ICD-10-CM | POA: Diagnosis not present

## 2022-03-16 DIAGNOSIS — Z7184 Encounter for health counseling related to travel: Secondary | ICD-10-CM | POA: Diagnosis not present

## 2022-03-16 MED ORDER — ATOVAQUONE-PROGUANIL HCL 250-100 MG PO TABS: ORAL_TABLET | ORAL | 0 refills | Status: DC

## 2022-03-16 MED ORDER — TYPHOID VACCINE PO CPDR: 1.0000 | DELAYED_RELEASE_CAPSULE | ORAL | 0 refills | Status: DC

## 2022-03-16 MED ORDER — ZOLPIDEM TARTRATE 10 MG PO TABS: 10.0000 mg | ORAL_TABLET | Freq: Every evening | ORAL | 1 refills | Status: DC | PRN

## 2022-03-16 NOTE — Progress Notes (Signed)
?  Subjective:  ?  ? Patient ID: Sean [name], male   DOB: 08/28/1956, 66 y.o.   MRN: [id number] ? ?HPI ? ?[name] is seen for travel advice encounter.  Upcoming trip to [place] in June.  He plans to spend most of his time in [place] and will be traveling throughout [place].  He was advised by someone in charge of their trip that he would need hepatitis A vaccine, typhoid vaccine, and malaria prevention. ? ?He has history of paroxysmal atrial fibrillation.  He is currently on Eliquis and metoprolol.  He has not had prior typhoid vaccination.  No history of hepatitis A vaccine.  Has not taken malaria prevention previously.  No immunosuppressive medications. ? ?Past Medical History:  ?Diagnosis Date  ? Atrial fibrillation (HCC)   ? ?Past Surgical History:  ?Procedure Laterality Date  ? CARDIOVERSION N/A 10/01/2019  ? Procedure: CARDIOVERSION;  Surgeon: Vesta Mixer, MD;  Location: [facility] ENDOSCOPY;  Service: Cardiovascular;  Laterality: N/A;  ? CARDIOVERSION N/A 12/16/2019  ? Procedure: CARDIOVERSION;  Surgeon: [name], MD;  Location[facility] END[facility]COPY;  Service: Cardiovascular;  Laterality: N/A;  ? CATARACT EXTRACTION, BILATERAL    ? EYE SURGERY  07/30/2019  ? ? reports that he has been smoking cigars. He has never used smokeless tobacco. He reports current alcohol use of about 4.0 - 5.0 standard drinks per week. He reports that he does not use drugs. ?family history includes Cancer (age of onset: 28) in his brother; Colon cancer in his mother. ?Allergies  ?Allergen Reactions  ? Quinolones   ?  Aortic aneurysm  ? ? ? ?Review of Systems  ?Constitutional:  Negative for chills and fever.  ?Respiratory:  Negative for shortness of breath.   ?Cardiovascular:  Negative for chest pain.  ? ?   ?Objective:  ? Physical Exam ?Vitals reviewed.  ?Constitutional:   ?   Appearance: Normal appearance.  ?Cardiovascular:  ?   Rate and Rhythm: Normal rate and regular rhythm.  ?Pulmonary:  ?   Effort: Pulmonary effort is normal.  ?    Breath sounds: Normal breath sounds.  ?Neurological:  ?   Mental Status: He is alert.  ? ? ?   ?Assessment:  ?   ?Travel advice encounter.  Upcoming trip in June to [place].  We discussed the following preventative measures ?   ?Plan:  ?   ?-Recommend hepatitis A vaccine.  He does not have time to get full complement of 2 vaccines 6 months apart we will go ahead with first and have recommended he get second hepatitis A vaccine in about 6 months ? ?-Discussed typhoid prevention.  We discussed oral Vivotif 1 tablet every other day for 4 doses.  He is aware this needs to be refrigerated and no concomitant antibiotic use.  We also discussed the fact that immunity is for 5 years with oral typhoid vaccine.  He does not have any contraindications. ? ?-Discussed malaria prevention.  Recommend Malarone 250/100 mg take 1 tablet daily starting 1 to 2 days prior to travel, during travel, and for 1 week after return. ? ?-According to [facility] website yellow fever vaccine generally not recommended for travel to [place] we also discussed increased risk of yellow fever vaccination at his age. ? ?Kristian Covey MD ?[facility] ? ?   ?

## 2022-04-06 ENCOUNTER — Other Ambulatory Visit: Payer: Self-pay | Admitting: *Deleted

## 2022-04-06 DIAGNOSIS — I7121 Aneurysm of the ascending aorta, without rupture: Secondary | ICD-10-CM

## 2022-04-09 ENCOUNTER — Other Ambulatory Visit: Payer: Self-pay | Admitting: Cardiovascular Disease

## 2022-04-18 ENCOUNTER — Telehealth: Payer: Self-pay | Admitting: Family Medicine

## 2022-04-18 MED ORDER — DOXYCYCLINE HYCLATE 100 MG PO TABS: ORAL_TABLET | ORAL | 0 refills | Status: DC

## 2022-04-18 NOTE — Telephone Encounter (Signed)
Pt is calling and anti malaria medication is causing him GI issue and pt would like another anti malaria medication to be sent to  [id number] [name], [place] - [address] Phone:  [phone]  Fax:  [phone]

## 2022-04-18 NOTE — Telephone Encounter (Signed)
Rx sent and pt aware 

## 2022-04-19 ENCOUNTER — Ambulatory Visit (INDEPENDENT_AMBULATORY_CARE_PROVIDER_SITE_OTHER): Payer: Medicare Other | Admitting: Cardiology

## 2022-04-19 ENCOUNTER — Encounter: Payer: Self-pay | Admitting: Cardiology

## 2022-04-19 VITALS — BP 112/78 | HR 54 | Ht 73.0 in | Wt 206.2 lb

## 2022-04-19 DIAGNOSIS — I77810 Thoracic aortic ectasia: Secondary | ICD-10-CM | POA: Diagnosis not present

## 2022-04-19 DIAGNOSIS — I4819 Other persistent atrial fibrillation: Secondary | ICD-10-CM | POA: Diagnosis not present

## 2022-04-19 NOTE — Patient Instructions (Addendum)
Medication Instructions:  Your physician recommends that you continue on your current medications as directed. Please refer to the Current Medication list given to you today. *If you need a refill on your cardiac medications before your next appointment, please call your pharmacy*  Lab Work: Aug 3  If you have labs (blood work) drawn today and your tests are completely normal, you will receive your results only by: MyChart Message (if you have MyChart) OR A paper copy in the mail If you have any lab test that is abnormal or we need to change your treatment, we will call you to review the results.  Testing/Procedures: Your physician has requested that you have cardiac CT. Cardiac computed tomography (CT) is a painless test that uses an x-ray machine to take clear, detailed pictures of your heart. For further information please visit [email or web address] Please follow instruction sheet as given.  Your physician has recommended that you have an ablation. Catheter ablation is a medical procedure used to treat some cardiac arrhythmias (irregular heartbeats). During catheter ablation, a long, thin, flexible tube is put into a blood vessel in your groin (upper thigh), or neck. This tube is called an ablation catheter. It is then guided to your heart through the blood vessel. Radio frequency waves destroy small areas of heart tissue where abnormal heartbeats may cause an arrhythmia to start. Please see the instruction sheet given to you today.   Follow-Up: At [facility], you and your health needs are our priority.  As part of our continuing mission to provide you with exceptional heart care, we have created designated Provider Care Teams.  These Care Teams include your primary Cardiologist (physician) and Advanced Practice Providers (APPs -  Physician Assistants and Nurse Practitioners) who all work together to provide you with the care you need, when you need it.  Your physician wants you to  follow-up in: ablation date picked Aug 24. Sean [name] will call you when set up is completed with instructions.   We recommend signing up for the patient portal called "MyChart".  Sign up information is provided on this After Visit Summary.  MyChart is used to connect with patients for Virtual Visits (Telemedicine).  Patients are able to view lab/test results, encounter notes, upcoming appointments, etc.  Non-urgent messages can be sent to your provider as well.   To learn more about what you can do with MyChart, go to [email or web address].    Any Other Special Instructions Will Be Listed Below (If Applicable).  Cardiac Ablation Cardiac ablation is a procedure to destroy (ablate) some heart tissue that is sending bad signals. These bad signals cause problems in heart rhythm. The heart has many areas that make these signals. If there are problems in these areas, they can make the heart beat in a way that is not normal. Destroying some tissues can help make the heart rhythm normal. Tell your doctor about: Any allergies you have. All medicines you are taking. These include vitamins, herbs, eye drops, creams, and over-the-counter medicines. Any problems you or family members have had with medicines that make you fall asleep (anesthetics). Any blood disorders you have. Any surgeries you have had. Any medical conditions you have, such as kidney failure. Whether you are pregnant or may be pregnant. What are the risks? This is a safe procedure. But problems may occur, including: Infection. Bruising and bleeding. Bleeding into the chest. Stroke or blood clots. Damage to nearby areas of your body. Allergies to medicines or dyes. The need for a pacemaker  if the normal system is damaged. Failure of the procedure to treat the problem. What happens before the procedure? Medicines Ask your doctor about: Changing or stopping your normal medicines. This is important. Taking aspirin and ibuprofen. Do not  take these medicines unless your doctor tells you to take them. Taking other medicines, vitamins, herbs, and supplements. General instructions Follow instructions from your doctor about what you cannot eat or drink. Plan to have someone take you home from the hospital or clinic. If you will be going home right after the procedure, plan to have someone with you for 24 hours. Ask your doctor what steps will be taken to prevent infection. What happens during the procedure?  An IV tube will be put into one of your veins. You will be given a medicine to help you relax. The skin on your neck or groin will be numbed. A cut (incision) will be made in your neck or groin. A needle will be put through your cut and into a large vein. A tube (catheter) will be put into the needle. The tube will be moved to your heart. Dye may be put through the tube. This helps your doctor see your heart. Small devices (electrodes) on the tube will send out signals. A type of energy will be used to destroy some heart tissue. The tube will be taken out. Pressure will be held on your cut. This helps stop bleeding. A bandage will be put over your cut. The exact procedure may vary among doctors and hospitals. What happens after the procedure? You will be watched until you leave the hospital or clinic. This includes checking your heart rate, breathing rate, oxygen, and blood pressure. Your cut will be watched for bleeding. You will need to lie still for a few hours. Do not drive for 24 hours or as long as your doctor tells you. Summary Cardiac ablation is a procedure to destroy some heart tissue. This is done to treat heart rhythm problems. Tell your doctor about any medical conditions you may have. Tell him or her about all medicines you are taking to treat them. This is a safe procedure. But problems may occur. These include infection, bruising, bleeding, and damage to nearby areas of your body. Follow what your doctor  tells you about food and drink. You may also be told to change or stop some of your medicines. After the procedure, do not drive for 24 hours or as long as your doctor tells you. This information is not intended to replace advice given to you by your health care provider. Make sure you discuss any questions you have with your health care provider. Document Revised: 10/17/2019 Document Reviewed: 10/17/2019 Elsevier Patient Education  [place].

## 2022-04-19 NOTE — Progress Notes (Signed)
Electrophysiology Office Note:    Date:  04/19/2022   ID:  Sean [name], DOB [birth year], MRN [id number]  PCP:  Eulas Post, MD  [facility] Cardiologist:  None  [facility] Electrophysiologist:  Vickie Epley, MD   Referring MD: Eulas Post, MD   Chief Complaint: Follow-up  History of Present Illness:    Sean [name] is a 66 y.o. male who presents for follow-up. He was previously followed by Dr. Rayann Heman. Initially seen 08/28/19 for new onset PAF. Their medical history includes atrial fibrillation. Successful [place] on 10/01/19 with relapse, and repeat [facility] on flecainide 12/16/2019 with early failure.  He was last seen by Dr. Rayann Heman on 09/17/2021 where he was doing well.  He followed up with Dr. Johnsie Cancel on 02/14/2022. Was tolerating Toprol 25 mg daily for rate control strategy. He preferred to stay on Eliquis despite CHADVASC 0. Since he is relatively young and very active they discussed referral to EP for a consult regarding ablation.  Prior to being on medication, he had noticed some dizziness and irregular heart beats. This would be his main indication that he would know he is in atrial fibrillation.   Previously he had developed severe headaches as a side effect to anti-arrhythmic medication.  He routinely goes to the [facility] and monitors his blood pressure. He remains active.  He denies any chest pain, shortness of breath, or peripheral edema. No syncope, orthopnea, or PND.     Past Medical History:  Diagnosis Date   Atrial fibrillation [facility])     Past Surgical History:  Procedure Laterality Date   CARDIOVERSION N/A 10/01/2019   Procedure: CARDIOVERSION;  Surgeon: Nahser, Wonda Cheng, MD;  Location: [facility];  Service: Cardiovascular;  Laterality: N/A;   CARDIOVERSION N/A 12/16/2019   Procedure: CARDIOVERSION;  Surgeon: Elouise Munroe, MD;  Location: [facility];  Service: Cardiovascular;  Laterality: N/A;   CATARACT EXTRACTION, BILATERAL     EYE  SURGERY  07/30/2019    Current Medications: Current Meds  Medication Sig   apixaban (ELIQUIS) 5 MG TABS tablet TAKE 1 TABLET BY MOUTH TWICE A DAY   doxycycline (VIBRA-TABS) 100 MG tablet Take 1 tablet daily starting 1 to 2 days prior to travel, during travel, and for 1 month after return   metoprolol succinate (TOPROL-XL) 25 MG 24 hr tablet Take 1 tablet (25 mg total) by mouth daily.   typhoid (VIVOTIF) DR capsule Take 1 capsule by mouth every other day.   zolpidem (AMBIEN) 10 MG tablet Take 1 tablet (10 mg total) by mouth at bedtime as needed. for sleep     Allergies:   Malarone [atovaquone-proguanil hcl] and Quinolones   Social History   Socioeconomic History   Marital status: Married    Spouse name: Not on file   Number of children: Not on file   Years of education: Not on file   Highest education level: Not on file  Occupational History   Not on file  Tobacco Use   Smoking status: Some Days    Types: Cigars   Smokeless tobacco: Never  Vaping Use   Vaping Use: Never used  Substance and Sexual Activity   Alcohol use: Yes    Alcohol/week: 4.0 - 5.0 standard drinks    Types: 4 - 5 Cans of beer per week   Drug use: No   Sexual activity: Not on file  Other Topics Concern   Not on file  Social History Narrative   Lives in [place] with  wife and kids.   He is an Forensic psychologist.      Social Determinants of Health   Financial Resource Strain: Not on file  Food Insecurity: Not on file  Transportation Needs: Not on file  Physical Activity: Not on file  Stress: Not on file  Social Connections: Not on file     Family History: The patient's family history includes Cancer (age of onset: 54) in his brother; Colon cancer in his mother.  ROS:   Please see the history of present illness.    All other systems reviewed and are negative.  EKGs/Labs/Other Studies Reviewed:    The following studies were reviewed today:  10/12/2021  Echo  1. Left ventricular ejection fraction,  by estimation, is 55 to 60%. The  left ventricle has normal function. The left ventricle has no regional  wall motion abnormalities. There is mild concentric left ventricular  hypertrophy. Left ventricular diastolic  function could not be evaluated.   2. Right ventricular systolic function is normal. The right ventricular  size is normal. There is normal pulmonary artery systolic pressure. The  estimated right ventricular systolic pressure is 0000000 mmHg.   3. Left atrial size was moderately dilated.   4. Right atrial size was moderately dilated.   5. The mitral valve is normal in structure. Mild to moderate mitral valve  regurgitation. No evidence of mitral stenosis.   6. Tricuspid valve regurgitation is mild to moderate.   7. The aortic valve is normal in structure. Aortic valve regurgitation is  mild. No aortic stenosis is present.   8. There is mild dilatation of the aortic root, measuring 40 mm. There is  mild dilatation of the ascending aorta, measuring 42 mm.   9. The inferior vena cava is normal in size with greater than 50%  respiratory variability, suggesting right atrial pressure of 3 mmHg.   Comparison(s): No significant change from prior study. Prior images reviewed side by side.   10/12/2021  CTA Chest/Aorta IMPRESSION: 1. Fusiform aneurysmal dilatation of the ascending thoracic aorta measuring 47 mm in diameter. Ascending thoracic aortic aneurysm. Recommend semi-annual imaging followup by CTA or MRA and referral to cardiothoracic surgery if not already obtained. This recommendation follows 2010 ACCF/AHA/AATS/ACR/ASA/SCA/SCAI/SIR/STS/SVM Guidelines for the Diagnosis and Management of Patients With Thoracic Aortic Disease. Circulation. 2010; [id number]. Aortic aneurysm NOS (ICD10-I71.9) 2. Cardiomegaly. 3.  Aortic Atherosclerosis (ICD10-I70.0). 4. Suspected hepatic steatosis.  Correlation with LFTs is advised. 5. Solitary punctate (2 mm) left lower lobe nodule. No  follow-up needed if patient is low-risk. Non-contrast chest CT can be considered in 12 months if patient is high-risk. This recommendation follows the consensus statement: Guidelines for Management of Incidental Pulmonary Nodules Detected on CT Images: From the Fleischner Society 2017; Radiology 2017; [id number].  10/21/2019  Exercise Stress Myoview The patient's resting heart rate was 106. The patient walked on a standard Bruce protocol treadmill test for 6 minutes. He achieved a peak heart rate of 158 which is 98% predicted maximal heart rate. At peak exercise there were no ST or T wave changes. There is no significant QRS widening with exercise. Nuclear stress EF: 33%. The left ventricular ejection fraction is moderately decreased (30-44%). This is a high risk study based on reduction of LV function. There is no evidence of ischemia or previous infarction    EKG:   EKG is personally reviewed.  04/19/2022: EKG was not ordered.    Recent Labs: 10/27/2021: ALT 48; BUN 17; Creatinine, Ser 1.04; Hemoglobin 15.4; Platelets 207.0;  Potassium 4.2; Sodium 137; TSH 2.75   Recent Lipid Panel    Component Value Date/Time   CHOL 203 (H) 10/27/2021 0815   TRIG 77.0 10/27/2021 0815   HDL 63.70 10/27/2021 0815   CHOLHDL 3 10/27/2021 0815   VLDL 15.4 10/27/2021 0815   LDLCALC 124 (H) 10/27/2021 0815   LDLDIRECT 137.4 10/15/2010 0908    Physical Exam:    VS:  BP 112/78   Pulse (!) 54   Ht 6\' 1"  (1.854 m)   Wt 206 lb 3.2 oz (93.5 kg)   SpO2 96%   BMI 27.20 kg/m     Wt Readings from Last 3 Encounters:  04/19/22 206 lb 3.2 oz (93.5 kg)  03/16/22 204 lb 8 oz (92.8 kg)  02/14/22 209 lb (94.8 kg)     GEN: Well nourished, well developed in no acute distress HEENT: Normal NECK: No JVD; No carotid bruits LYMPHATICS: No lymphadenopathy CARDIAC: Irregularly irregular, no murmurs, rubs, gallops RESPIRATORY:  Clear to auscultation without rales, wheezing or rhonchi  ABDOMEN: Soft,  non-tender, non-distended MUSCULOSKELETAL:  No edema; No deformity  SKIN: Warm and dry NEUROLOGIC:  Alert and oriented x 3 PSYCHIATRIC:  Normal affect       ASSESSMENT:    1. Persistent atrial fibrillation ([place])   2. Dilated aortic root (HCC)    PLAN:    In order of problems listed above:  #Persistent atrial fibrillation Minimal symptom burden but the patient is interested in a rhythm control strategy.  We discussed the data supporting rhythm control in an otherwise healthy, relatively young patient.  We discussed antiarrhythmic drugs including dofetilide and amiodarone during today's visit.  He clearly would like to avoid a rhythm control strategy using antiarrhythmic drugs which I think is very reasonable.  I discussed the catheter ablation procedure in detail with the patient including the risks, recovery and likelihood of success.  I discussed a success rate of about 70% for persistent atrial fibrillation ablations.  We discussed the possibility of needing a repeat ablation procedure.  Risk, benefits, and alternatives to EP study and radiofrequency ablation for afib were also discussed in detail today. These risks include but are not limited to stroke, bleeding, vascular damage, tamponade, perforation, damage to the esophagus, lungs, and other structures, pulmonary vein stenosis, worsening renal function, and death. The patient understands these risk and wishes to proceed.  We will therefore proceed with catheter ablation at the next available time.  Carto, ICE, anesthesia are requested for the procedure.  Will also obtain CT PV protocol prior to the procedure to exclude LAA thrombus and further evaluate atrial anatomy.   Total time spent with patient today 60 minutes. This includes reviewing records, evaluating the patient and coordinating care.  Medication Adjustments/Labs and Tests Ordered: Current medicines are reviewed at length with the patient today.  Concerns regarding  medicines are outlined above.  No orders of the defined types were placed in this encounter.  No orders of the defined types were placed in this encounter.   I,Mathew Stumpf,acting as a Education administrator for Vickie Epley, MD.,have documented all relevant documentation on the behalf of Vickie Epley, MD,as directed by  Vickie Epley, MD while in the presence of Vickie Epley, MD.  I, Vickie Epley, MD, have reviewed all documentation for this visit. The documentation on 04/19/22 for the exam, diagnosis, procedures, and orders are all accurate and complete.   Signed, Hilton Cork. Quentin Ore, MD, [facility], [facility] 04/19/2022 9:29 AM  Electrophysiology [facility]

## 2022-05-04 ENCOUNTER — Other Ambulatory Visit: Payer: Self-pay

## 2022-05-04 DIAGNOSIS — I480 Paroxysmal atrial fibrillation: Secondary | ICD-10-CM

## 2022-05-12 ENCOUNTER — Other Ambulatory Visit: Payer: PRIVATE HEALTH INSURANCE

## 2022-05-17 ENCOUNTER — Encounter: Payer: PRIVATE HEALTH INSURANCE | Admitting: Thoracic Surgery (Cardiothoracic Vascular Surgery)

## 2022-06-07 ENCOUNTER — Inpatient Hospital Stay: Admission: RE | Admit: 2022-06-07 | Payer: PRIVATE HEALTH INSURANCE | Source: Ambulatory Visit

## 2022-06-07 ENCOUNTER — Ambulatory Visit: Payer: Medicare Other | Admitting: Thoracic Surgery (Cardiothoracic Vascular Surgery)

## 2022-06-07 ENCOUNTER — Other Ambulatory Visit: Payer: PRIVATE HEALTH INSURANCE

## 2022-06-09 ENCOUNTER — Telehealth: Payer: Self-pay | Admitting: Cardiovascular Disease

## 2022-06-09 NOTE — Telephone Encounter (Signed)
Spoke with the pt and he just arrived back here from a trip to [place]... he wanted to go over his upcoming appts... we went through each appt and I will mail him his calendar... I advised him that Dr Geannie Risen nurse will reach out to him closer to his Ablation planned for 07/21/22.

## 2022-06-09 NOTE — Telephone Encounter (Signed)
Patient called wanting to get clarification of his upcoming procedures.

## 2022-06-22 ENCOUNTER — Telehealth: Payer: Self-pay | Admitting: *Deleted

## 2022-06-22 NOTE — Telephone Encounter (Signed)
Went over instructions for CT and Ablation sent over [place].  Answered all questions. Patient verbalized agreement and understanding.

## 2022-06-30 ENCOUNTER — Other Ambulatory Visit: Payer: Medicare Other

## 2022-06-30 DIAGNOSIS — I480 Paroxysmal atrial fibrillation: Secondary | ICD-10-CM

## 2022-06-30 LAB — BASIC METABOLIC PANEL
BUN/Creatinine Ratio: 13 (ref 10–24)
BUN: 14 mg/dL (ref 8–27)
CO2: 21 mmol/L (ref 20–29)
Calcium: 9.2 mg/dL (ref 8.6–10.2)
Chloride: 104 mmol/L (ref 96–106)
Creatinine, Ser: 1.06 mg/dL (ref 0.76–1.27)
Glucose: 147 mg/dL — ABNORMAL HIGH (ref 70–99)
Potassium: 4.1 mmol/L (ref 3.5–5.2)
Sodium: 137 mmol/L (ref 134–144)
eGFR: 78 mL/min/{1.73_m2} (ref >59)

## 2022-06-30 LAB — CBC WITH DIFFERENTIAL/PLATELET
Basophils Absolute: 0.1 10*3/uL (ref 0.0–0.2)
Basos: 1 %
EOS (ABSOLUTE): 0.1 10*3/uL (ref 0.0–0.4)
Eos: 1 %
Hematocrit: 45.2 % (ref 37.5–51.0)
Hemoglobin: 15.2 g/dL (ref 13.0–17.7)
Immature Grans (Abs): 0 10*3/uL (ref 0.0–0.1)
Immature Granulocytes: 0 %
Lymphocytes Absolute: 1.7 10*3/uL (ref 0.7–3.1)
Lymphs: 30 %
MCH: 33.5 pg — ABNORMAL HIGH (ref 26.6–33.0)
MCHC: 33.6 g/dL (ref 31.5–35.7)
MCV: 100 fL — ABNORMAL HIGH (ref 79–97)
Monocytes Absolute: 0.7 10*3/uL (ref 0.1–0.9)
Monocytes: 12 %
Neutrophils Absolute: 3 10*3/uL (ref 1.4–7.0)
Neutrophils: 56 %
Platelets: 168 10*3/uL (ref 150–450)
RBC: 4.54 x10E6/uL (ref 4.14–5.80)
RDW: 12.4 % (ref 11.6–15.4)
WBC: 5.5 10*3/uL (ref 3.4–10.8)

## 2022-07-01 ENCOUNTER — Telehealth: Payer: Self-pay | Admitting: Cardiology

## 2022-07-01 ENCOUNTER — Telehealth: Payer: Self-pay | Admitting: Cardiovascular Disease

## 2022-07-01 NOTE — Telephone Encounter (Signed)
Patient would like to speak to nurse. 

## 2022-07-01 NOTE — Telephone Encounter (Signed)
The patient got dx with melanoma 8/3. He needs to have that removed prior to ablation and may require to hold Eliquis as well. He is waiting for appt to figure out timing of that.  Picked new ablation date, November 20 and pre op labs Oct 30. Rescheduled procedure. Canceled scheduled CT, and post op appt. Will call patient once procedure is worked up again.  Patient verbalized understanding and agreement.

## 2022-07-01 NOTE — Telephone Encounter (Signed)
Left message to call back  

## 2022-07-01 NOTE — Telephone Encounter (Signed)
Patient would like to reschedule his procedure to October. He has skin cancer that he is going to have to have surgery for now.

## 2022-07-01 NOTE — Telephone Encounter (Signed)
See other phone encounter.  

## 2022-07-08 ENCOUNTER — Ambulatory Visit
Admission: RE | Admit: 2022-07-08 | Discharge: 2022-07-08 | Disposition: A | Discharge status: Home / Self Care | Payer: Medicare Other | Source: Ambulatory Visit | Attending: Thoracic Surgery (Cardiothoracic Vascular Surgery) | Admitting: Thoracic Surgery (Cardiothoracic Vascular Surgery)

## 2022-07-08 DIAGNOSIS — I7121 Aneurysm of the ascending aorta, without rupture: Secondary | ICD-10-CM

## 2022-07-08 MED ORDER — IOPAMIDOL (ISOVUE-370) INJECTION 76%
75.0000 mL | Freq: Once | INTRAVENOUS | Status: AC | PRN
Start: 2022-07-08 — End: 2022-07-08
  Administered 2022-07-08: 75 mL via INTRAVENOUS

## 2022-07-12 ENCOUNTER — Ambulatory Visit (INDEPENDENT_AMBULATORY_CARE_PROVIDER_SITE_OTHER): Payer: Medicare Other | Admitting: Thoracic Surgery (Cardiothoracic Vascular Surgery)

## 2022-07-12 ENCOUNTER — Encounter: Payer: Self-pay | Admitting: Thoracic Surgery (Cardiothoracic Vascular Surgery)

## 2022-07-12 VITALS — BP 123/86 | HR 60 | Resp 20 | Ht 73.0 in | Wt 200.0 lb

## 2022-07-12 DIAGNOSIS — I7121 Aneurysm of the ascending aorta, without rupture: Secondary | ICD-10-CM | POA: Diagnosis not present

## 2022-07-12 NOTE — Progress Notes (Signed)
[address]       [place] [address]             [phone]    HPI: [name] returns for a scheduled follow-up visit regarding his ascending aneurysm.  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  He was first noted to have a dilated aortic root and ascending aorta on echocardiogram.  A CT angiogram of the chest in 2022 showed a 4.6 cm ascending aneurysm.  He is not having any chest pain, pressure, or tightness.  No shortness of breath.  [place].  He is scheduled to have surgery later this week for a melanoma on his cheek.  Past Medical History:  Diagnosis Date   Ascending aortic aneurysm (HCC)    Atrial fibrillation (HCC)     Current Outpatient Medications  Medication Sig Dispense Refill   apixaban (ELIQUIS) 5 MG TABS tablet TAKE 1 TABLET BY MOUTH TWICE A DAY 60 tablet 6   atovaquone-proguanil (MALARONE) 250-100 MG TABS tablet Take one tablet by mouth once daily starting 1-2 days prior to travel, during travel, and for one week after return 30 tablet 0   doxycycline (VIBRA-TABS) 100 MG tablet Take 1 tablet daily starting 1 to 2 days prior to travel, during travel, and for 1 month after return 45 tablet 0   metoprolol succinate (TOPROL-XL) 25 MG 24 hr tablet Take 1 tablet (25 mg total) by mouth daily. 90 tablet 3   typhoid (VIVOTIF) DR capsule Take 1 capsule by mouth every other day. 4 capsule 0   zolpidem (AMBIEN) 10 MG tablet Take 1 tablet (10 mg total) by mouth at bedtime as needed. for sleep 30 tablet 1   No current facility-administered medications for this visit.    Physical Exam BP 123/86   Pulse 60   Resp 20   Ht 6\' 1"  (1.854 m)   Wt 200 lb (90.7 kg)   SpO2 96% Comment: RA  BMI 26.28 kg/m  66 year old man in no acute distress Alert and oriented x3 with no focal deficits Lungs clear bilaterally Cardiac irregularly irregular No  peripheral edema  Diagnostic Tests: CT ANGIOGRAPHY CHEST WITH CONTRAST   TECHNIQUE: Multidetector CT imaging of the chest was performed using the standard protocol during bolus administration of intravenous contrast. Multiplanar CT image reconstructions and MIPs were obtained to evaluate the vascular anatomy.   RADIATION DOSE REDUCTION: This exam was performed according to the departmental dose-optimization program which includes automated exposure control, adjustment of the mA and/or kV according to patient size and/or use of iterative reconstruction technique.   CONTRAST:  67mL ISOVUE-370 IOPAMIDOL (ISOVUE-370) INJECTION 76%   COMPARISON:  Chest CT dated October 12, 2021   FINDINGS: Cardiovascular: Normal heart size. No pericardial effusion. Dilated ascending thoracic aorta measuring 4.4 x 4.4 cm the level of the main pulmonary artery (measurements obtained using), unchanged when compared with prior exam and remeasured in similar plane. Small focal outpouching of the aortic arch which is peripherally calcified and partially thrombosed measuring 1.3 cm, likely a chronic penetrating atherosclerotic ulcer, unchanged when compared with the prior. Normal heart size. No pericardial effusion.   Mediastinum/Nodes: Esophagus and thyroid are unremarkable. No pathologically enlarged lymph nodes seen in the chest.   Lungs/Pleura: Central airways are patent. No consolidation, pleural effusion or pneumothorax. Stable small 2 mm pulmonary nodule of the left lower lobe.  Upper Abdomen: Small diverticulum of the proximal duodenum. No acute abnormality.   Musculoskeletal: No chest wall abnormality. No acute or significant osseous findings.   Review of the MIP images confirms the above findings.   IMPRESSION: 1. Dilated ascending thoracic aorta, measuring up to 4.4 cm, unchanged when compared with the prior exam and remeasured in similar plane. Recommend annual imaging followup by  CTA or MRA. This recommendation follows 2010 ACCF/AHA/AATS/ACR/ASA/SCA/SCAI/SIR/STS/SVM Guidelines for the Diagnosis and Management of Patients with Thoracic Aortic Disease. Circulation. 2010; 121: E174-Y814. Aortic aneurysm NOS (ICD10-I71.9) 2. Stable peripherally calcified and partially thrombosed small focal outpouching of the aortic arch, likely a chronic penetrating atherosclerotic ulcer. 3. Aortic Atherosclerosis (ICD10-I70.0). 4. Stable small 2 mm pulmonary nodule of the left lower lobe. No follow-up needed if patient is low-risk.This recommendation follows the consensus statement: Guidelines for Management of Incidental Pulmonary Nodules Detected on CT Images: From the Fleischner Society 2017; Radiology 2017; 284:228-243.     Electronically Signed   By: Allegra Lai M.D.   On: 07/08/2022 13:02 I personally reviewed the CT images.  There is aortic atherosclerosis, stable 4.6 cm ascending aneurysm and penetrating atherosclerotic ulcer in the distal arch.  2 mm lung nodule unchanged.  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  Thoracic aortic atherosclerosis/ascending aneurysm/penetrating ulcer.  Stable at 4.6 cm.  Needs continued semiannual follow-up.  Blood pressure well controlled on metoprolol.  Atrial fibrillation-followed by Dr. Lalla Brothers.  Rate controlled with metoprolol.  On Eliquis.  Lung nodule-2 mm nodule consistent with parenchymal node.  No reason to suspect this is significant, will be followed as we follow his aneurysm.  Plan: Return in 6 months with CT angio of chest to follow-up ascending aneurysm and penetrating ulcer.  Loreli Slot, MD [facility] [phone]

## 2022-07-14 ENCOUNTER — Other Ambulatory Visit (HOSPITAL_COMMUNITY): Payer: PRIVATE HEALTH INSURANCE

## 2022-08-04 NOTE — Progress Notes (Signed)
CARDIOLOGY OFFICE NOTE  Date:  08/17/2022    Sean [name] Date of Birth: [birth year] Medical Record #[id number]  PCP:  Kristian Covey, MD  Cardiologist:  Francina Ames chief complaint on file.   History of Present Illness: Sean [name] is a 66 y.o. male first seen 08/28/19 for new onset PAF   CHADSVASC of 0. Not symptomatic. Has cut back alcohol use one glass red wine in evening . Successful [facility] 10/01/19 with relapse And repeat [facility] on flecainide 12/16/19 with early failure again Seen by Allred 03/02/20 and ablation or alternative AAT deferred   Echo 09/06/19  EF 55-60% normal atrial size Mild/Moderate AR Trace MR  Myovue 10/21/19 non ischemia EF not accurate due to afib and normal by echo   He is still able to ride bike 1.5 hours on [address] Traveling to [place]/ [place] may do some scuba diving  Tolerating low dose Toprol   Was supposed to have an ablation with Dr Lalla Brothers but developed a melanoma that needed resection Ablation rescheduled for December  Has dilated aortic root followed by Dorris Fetch Most recent CTA  07/08/22 Only 4.4 cm   Still practicing Civil Law Active Does not notice afib No bleeding issues     Past Medical History:  Diagnosis Date   Ascending aortic aneurysm (HCC)    Atrial fibrillation [facility])     Past Surgical History:  Procedure Laterality Date   CARDIOVERSION N/A 10/01/2019   Procedure: CARDIOVERSION;  Surgeon: Elease Hashimoto Deloris Ping, MD;  Location: [facility] ENDOSCOPY;  Service: Cardiovascular;  Laterality: N/A;   CARDIOVERSION N/A 12/16/2019   Procedure: CARDIOVERSION;  Surgeon: [name], MD;  Location: [facility] ENDOSCOPY;  Service: Cardiovascular;  Laterality: N/A;   CATARACT EXTRACTION, BILATERAL     EYE SURGERY  07/30/2019     Medications: Current Meds  Medication Sig   zolpidem (AMBIEN) 10 MG tablet Take 1 tablet (10 mg total) by mouth at bedtime as needed. for sleep   [DISCONTINUED] apixaban (ELIQUIS) 5 MG TABS tablet TAKE 1 TABLET  BY MOUTH TWICE A DAY   [DISCONTINUED] metoprolol succinate (TOPROL-XL) 25 MG 24 hr tablet Take 1 tablet (25 mg total) by mouth daily.     Allergies: Allergies  Allergen Reactions   Malarone [Atovaquone-Proguanil Hcl]     Upset stomach, stomach pain   Quinolones     Aortic aneurysm    Social History: The patient  reports that he has been smoking cigars. He has never used smokeless tobacco. He reports current alcohol use of about 4.0 - 5.0 standard drinks of alcohol per week. He reports that he does not use drugs.   Family History: The patient's family history includes Cancer (age of onset: 24) in his brother; Colon cancer in his mother. His father died at 81 - had sudden death - no autopsy. Mother died at 39 with colon cancer.   Review of Systems: Please see the history of present illness.   All other systems are reviewed and negative.   Physical Exam: VS:  BP 116/74   Pulse 69   Ht 6\' 2"  (1.88 m)   Wt 198 lb (89.8 kg)   SpO2 96%   BMI 25.42 kg/m  .  BMI Body mass index is 25.42 kg/m.  Wt Readings from Last 3 Encounters:  08/17/22 198 lb (89.8 kg)  07/12/22 200 lb (90.7 kg)  04/19/22 206 lb 3.2 oz (93.5 kg)    Affect appropriate Healthy:  appears stated age HEENT: normal  Neck supple with no adenopathy JVP normal no bruits no thyromegaly Lungs clear with no wheezing and good diaphragmatic motion Heart:  S1/S2 AR  murmur, no rub, gallop or click PMI normal Abdomen: benighn, BS positve, no tenderness, no AAA no bruit.  No HSM or HJR Distal pulses intact with no bruits No edema Neuro non-focal Skin warm and dry No muscular weakness    LABORATORY DATA:  EKG:  12/24/19 afibr/flutter rate 92 otherwise normal 08/17/2022 Afib rate 95 normal otherwise  Lab Results  Component Value Date   WBC 5.5 06/30/2022   HGB 15.2 06/30/2022   HCT 45.2 06/30/2022   PLT 168 06/30/2022   GLUCOSE 147 (H) 06/30/2022   CHOL 203 (H) 10/27/2021   TRIG 77.0 10/27/2021   HDL 63.70  10/27/2021   LDLDIRECT 137.4 10/15/2010   LDLCALC 124 (H) 10/27/2021   ALT 48 10/27/2021   AST 37 10/27/2021   NA 137 06/30/2022   K 4.1 06/30/2022   CL 104 06/30/2022   CREATININE 1.06 06/30/2022   BUN 14 06/30/2022   CO2 21 06/30/2022   TSH 2.75 10/27/2021   PSA 3.65 10/27/2021   INR 1.1 09/25/2019     BNP (last 3 results) No results for input(s): "BNP" in the last 8760 hours.  ProBNP (last 3 results) No results for input(s): "PROBNP" in the last 8760 hours.   Other Studies Reviewed Today:  ECHO IMPRESSIONS 10/12/21   IMPRESSIONS     1. Left ventricular ejection fraction, by estimation, is 55 to 60%. The  left ventricle has normal function. The left ventricle has no regional  wall motion abnormalities. There is mild concentric left ventricular  hypertrophy. Left ventricular diastolic  function could not be evaluated.   2. Right ventricular systolic function is normal. The right ventricular  size is normal. There is normal pulmonary artery systolic pressure. The  estimated right ventricular systolic pressure is 28.8 mmHg.   3. Left atrial size was moderately dilated.   4. Right atrial size was moderately dilated.   5. The mitral valve is normal in structure. Mild to moderate mitral valve  regurgitation. No evidence of mitral stenosis.   6. Tricuspid valve regurgitation is mild to moderate.   7. The aortic valve is normal in structure. Aortic valve regurgitation is  mild. No aortic stenosis is present.   8. There is mild dilatation of the aortic root, measuring 40 mm. There is  mild dilatation of the ascending aorta, measuring 42 mm.   9. The inferior vena cava is normal in size with greater than 50%  respiratory variability, suggesting right atrial pressure of 3 mmHg.    Assessment/Plan:  1. Atrial fib - persistent - has normal atrial size - successful [facility] 10/01/19 CHADVASC 0 ERA Repeat DCC done 12/16/19 on flecainide with again early failure Seen by Dr Johney Frame  03/02/20 deferred ablation Flecainide caused severe headache Start Toprol 25 mg daily for rate control strategy Ablation with Dr Lalla Brothers December  2. Anticoagulation -  No bleeding issues on eliquis  Despite CHADVASC  0 he prefers to stay on Eliquis   3. 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   4. Dilated aorta -  His father died with sudden death - this a presumed MI. Precautions were discussed (Bp control, no Valsalva, no Fluoroquinolones).     5. Mild to moderate AI - f/u echo 08/2020  CTA 10/12/21 stable 4.7 cm  Seen by Dr Dorris Fetch 10/2021 who will follow CTA 07/08/22 only  measures 4.4 cm   5. Aortic Regurgitation:  tri leaflet AV in setting of aneurysm mild by TTE 10/12/21   6. HTN:  Well controlled continue beta blocker    F/U in 6 months  Refer EP Lalla Brothers ? Ablation  F/U CVTS [place] for CTA/MRA imaging of aorta   Signed: Charlton Haws, MD  08/17/2022 9:11 AM  [facility] [address] [place], [place]  [address] Phone: [phone] Fax: [phone]

## 2022-08-12 ENCOUNTER — Telehealth: Payer: Self-pay | Admitting: *Deleted

## 2022-08-12 DIAGNOSIS — Z01818 Encounter for other preprocedural examination: Secondary | ICD-10-CM

## 2022-08-12 DIAGNOSIS — I480 Paroxysmal atrial fibrillation: Secondary | ICD-10-CM

## 2022-08-12 NOTE — Telephone Encounter (Signed)
Patient called in to ask about post ablation restriction we discussed.  - No driving for 3 days, no lifting over 10 lbs for 5 days, and no work or strenuous activity for 7 days.    The patient wishes to reschedule his procedure again related to Nov 20 being to close to the holiday with post restrictions. New procedure date is Dec 21, new pre op lab date Nov 30.  Patient verbalized understanding and agreement.

## 2022-08-17 ENCOUNTER — Encounter: Payer: Self-pay | Admitting: Cardiovascular Disease

## 2022-08-17 ENCOUNTER — Ambulatory Visit: Payer: Medicare Other | Attending: Cardiovascular Disease | Admitting: Cardiovascular Disease

## 2022-08-17 VITALS — BP 116/74 | HR 69 | Ht 74.0 in | Wt 198.0 lb

## 2022-08-17 DIAGNOSIS — I4819 Other persistent atrial fibrillation: Secondary | ICD-10-CM | POA: Diagnosis present

## 2022-08-17 DIAGNOSIS — I77810 Thoracic aortic ectasia: Secondary | ICD-10-CM | POA: Insufficient documentation

## 2022-08-17 DIAGNOSIS — I480 Paroxysmal atrial fibrillation: Secondary | ICD-10-CM | POA: Insufficient documentation

## 2022-08-17 DIAGNOSIS — I351 Nonrheumatic aortic (valve) insufficiency: Secondary | ICD-10-CM | POA: Diagnosis present

## 2022-08-17 MED ORDER — METOPROLOL SUCCINATE ER 25 MG PO TB24: 25.0000 mg | ORAL_TABLET | Freq: Every day | ORAL | 3 refills | Status: DC

## 2022-08-17 MED ORDER — APIXABAN 5 MG PO TABS: 5.0000 mg | ORAL_TABLET | Freq: Two times a day (BID) | ORAL | 2 refills | Status: DC

## 2022-08-17 NOTE — Patient Instructions (Signed)
Medication Instructions:  Your physician has recommended you make the following change in your medication:   *If you need a refill on your cardiac medications before your next appointment, please call your pharmacy*  Lab Work: If you have labs (blood work) drawn today and your tests are completely normal, you will receive your results only by: [place] (if you have MyChart) OR A paper copy in the mail If you have any lab test that is abnormal or we need to change your treatment, we will call you to review the results.  Testing/Procedures: Your physician has requested that you have an echocardiogram. Echocardiography is a painless test that uses sound waves to create images of your heart. It provides your doctor with information about the size and shape of your heart and how well your heart's chambers and valves are working. This procedure takes approximately one hour. There are no restrictions for this procedure.  Follow-Up: At [facility], you and your health needs are our priority.  As part of our continuing mission to provide you with exceptional heart care, we have created designated Provider Care Teams.  These Care Teams include your primary Cardiologist (physician) and Advanced Practice Providers (APPs -  Physician Assistants and Nurse Practitioners) who all work together to provide you with the care you need, when you need it.  We recommend signing up for the patient portal called "MyChart".  Sign up information is provided on this After Visit Summary.  MyChart is used to connect with patients for Virtual Visits (Telemedicine).  Patients are able to view lab/test results, encounter notes, upcoming appointments, etc.  Non-urgent messages can be sent to your provider as well.   To learn more about what you can do with MyChart, go to [email or web address].    Your next appointment:   12 month(s)  The format for your next appointment:   In Person  Provider:   Jenkins Rouge, MD      Important Information About Sugar

## 2022-08-18 ENCOUNTER — Ambulatory Visit (HOSPITAL_COMMUNITY): Payer: PRIVATE HEALTH INSURANCE | Admitting: Nurse Practitioner

## 2022-08-24 ENCOUNTER — Telehealth: Payer: Self-pay | Admitting: Family Medicine

## 2022-08-24 NOTE — Telephone Encounter (Signed)
Refil zolpidem (AMBIEN) 10 MG tablet

## 2022-08-25 MED ORDER — ZOLPIDEM TARTRATE 10 MG PO TABS: 10.0000 mg | ORAL_TABLET | Freq: Every evening | ORAL | 1 refills | Status: DC | PRN

## 2022-08-25 NOTE — Addendum Note (Signed)
Addended by: Eulas Post on: 08/25/2022 12:13 PM   Modules accepted: Orders

## 2022-10-06 NOTE — Addendum Note (Signed)
Addended by: Sheppard Evens E on: 10/06/2022 12:08 PM   Modules accepted: Orders

## 2022-10-14 ENCOUNTER — Ambulatory Visit (HOSPITAL_COMMUNITY): Payer: Medicare Other | Attending: Cardiovascular Disease

## 2022-10-14 ENCOUNTER — Telehealth: Payer: Self-pay

## 2022-10-14 DIAGNOSIS — I351 Nonrheumatic aortic (valve) insufficiency: Secondary | ICD-10-CM | POA: Diagnosis present

## 2022-10-14 DIAGNOSIS — I480 Paroxysmal atrial fibrillation: Secondary | ICD-10-CM | POA: Insufficient documentation

## 2022-10-14 LAB — ECHOCARDIOGRAM COMPLETE
MV M vel: 4.89 m/s
MV Peak grad: 95.6 mmHg
S' Lateral: 4.2 cm

## 2022-10-14 MED ORDER — METOPROLOL SUCCINATE ER 50 MG PO TB24: 50.0000 mg | ORAL_TABLET | Freq: Every day | ORAL | 3 refills | Status: DC

## 2022-10-14 NOTE — Telephone Encounter (Signed)
Patient having echo today. Patient is in A. FIB with a HR of  90's to 150's. Patient is asymptomatic.  Patient is planning to have ablation with Dr. Lalla Brothers. Dr. Eden Emms recommended to increase his metoprolol to 50 mg by mouth daily for now. Sent in prescription and updated medication list. Will send message to the Ablation scheduler to make sure patient is all set for his procedure.

## 2022-10-24 ENCOUNTER — Telehealth: Payer: Self-pay

## 2022-10-24 ENCOUNTER — Ambulatory Visit: Payer: Medicare Other | Attending: Cardiology

## 2022-10-24 ENCOUNTER — Ambulatory Visit: Payer: PRIVATE HEALTH INSURANCE | Admitting: Cardiology

## 2022-10-24 DIAGNOSIS — I4891 Unspecified atrial fibrillation: Secondary | ICD-10-CM

## 2022-10-24 DIAGNOSIS — I480 Paroxysmal atrial fibrillation: Secondary | ICD-10-CM

## 2022-10-24 DIAGNOSIS — Z01818 Encounter for other preprocedural examination: Secondary | ICD-10-CM

## 2022-10-24 NOTE — Telephone Encounter (Signed)
Pt is scheduled for DCCV on 11/30 with Dr. Cristal Deer.   Pt is aware of Instructions being sent via MyChart.  He will come in today for labs. 10/24/22.

## 2022-10-25 LAB — BASIC METABOLIC PANEL
BUN/Creatinine Ratio: 14 (ref 10–24)
BUN: 13 mg/dL (ref 8–27)
CO2: 17 mmol/L — ABNORMAL LOW (ref 20–29)
Calcium: 9.1 mg/dL (ref 8.6–10.2)
Chloride: 107 mmol/L — ABNORMAL HIGH (ref 96–106)
Creatinine, Ser: 0.93 mg/dL (ref 0.76–1.27)
Glucose: 104 mg/dL — ABNORMAL HIGH (ref 70–99)
Potassium: 4.1 mmol/L (ref 3.5–5.2)
Sodium: 139 mmol/L (ref 134–144)
eGFR: 91 mL/min/{1.73_m2} (ref >59)

## 2022-10-25 LAB — CBC WITH DIFFERENTIAL/PLATELET
Basophils Absolute: 0.1 10*3/uL (ref 0.0–0.2)
Basos: 1 %
EOS (ABSOLUTE): 0.1 10*3/uL (ref 0.0–0.4)
Eos: 1 %
Hematocrit: 42.8 % (ref 37.5–51.0)
Hemoglobin: 15 g/dL (ref 13.0–17.7)
Immature Grans (Abs): 0 10*3/uL (ref 0.0–0.1)
Immature Granulocytes: 0 %
Lymphocytes Absolute: 1.4 10*3/uL (ref 0.7–3.1)
Lymphs: 22 %
MCH: 35.4 pg — ABNORMAL HIGH (ref 26.6–33.0)
MCHC: 35 g/dL (ref 31.5–35.7)
MCV: 101 fL — ABNORMAL HIGH (ref 79–97)
Monocytes Absolute: 0.8 10*3/uL (ref 0.1–0.9)
Monocytes: 13 %
Neutrophils Absolute: 4.1 10*3/uL (ref 1.4–7.0)
Neutrophils: 63 %
Platelets: 149 10*3/uL — ABNORMAL LOW (ref 150–450)
RBC: 4.24 x10E6/uL (ref 4.14–5.80)
RDW: 12.1 % (ref 11.6–15.4)
WBC: 6.5 10*3/uL (ref 3.4–10.8)

## 2022-10-26 DIAGNOSIS — I4819 Other persistent atrial fibrillation: Secondary | ICD-10-CM

## 2022-10-26 NOTE — H&P (Signed)
CARDIOLOGY OFFICE NOTE  Date:  10/26/2022    Sean [name] Date of Birth: [birth year] Medical Record #[id number]  PCP:  Kristian Covey, MD  Cardiologist:  Shireen Quan    History of Present Illness: Sean [name] is a 66 y.o. male first seen 08/28/19 for new onset PAF For [facility] today with Dr Cristal Deer prior to ablation with Dr Lalla Brothers in December   CHADSVASC of 0. Not symptomatic. Has cut back alcohol use one glass red wine in evening . Successful [facility] 10/01/19 with relapse And repeat [facility] on flecainide 12/16/19 with early failure again Seen by Allred 03/02/20 and ablation or alternative AAT deferred   Echo 09/06/19  EF 55-60% normal atrial size Mild/Moderate AR Trace MR  Myovue 10/21/19 non ischemia EF not accurate due to afib and normal by echo   He is still able to ride bike 1.5 hours on [address] Traveling to [place]/ [place] may do some scuba diving  Tolerating low dose Toprol   Was supposed to have an ablation with Dr Lalla Brothers but developed a melanoma that needed resection Ablation rescheduled for December  Has dilated aortic root followed by Dorris Fetch Most recent CTA  07/08/22 Only 4.4 cm   Still practicing Civil Law Active Has had recurrent PAF with rapid rates and symptomatic Discussed with Dr Lalla Brothers who wanted [facility] done prior to ablation as TTE showed newly decreased LV function with EF 30-35% with severe RAE, moderate LAE mild/moderate MR and moderately reduced RV function     Past Medical History:  Diagnosis Date   Ascending aortic aneurysm (HCC)    Atrial fibrillation [facility])     Past Surgical History:  Procedure Laterality Date   CARDIOVERSION N/A 10/01/2019   Procedure: CARDIOVERSION;  Surgeon: Elease Hashimoto Deloris Ping, MD;  Location: [facility] ENDOSCOPY;  Service: Cardiovascular;  Laterality: N/A;   CARDIOVERSION N/A 12/16/2019   Procedure: CARDIOVERSION;  Surgeon: [name], MD;  Location: [facility] ENDOSCOPY;  Service: Cardiovascular;  Laterality: N/A;    CATARACT EXTRACTION, BILATERAL     EYE SURGERY  07/30/2019     Medications: No outpatient medications have been marked as taking for the 11/17/22 encounter [facility] Encounter).     Allergies: Allergies  Allergen Reactions   Malarone [Atovaquone-Proguanil Hcl]     Upset stomach, stomach pain   Quinolones     Aortic aneurysm    Social History: The patient  reports that he has been smoking cigars. He has never used smokeless tobacco. He reports current alcohol use of about 4.0 - 5.0 standard drinks of alcohol per week. He reports that he does not use drugs.   Family History: The patient's family history includes Cancer (age of onset: 8) in his brother; Colon cancer in his mother. His father died at 58 - had sudden death - no autopsy. Mother died at 83 with colon cancer.   Review of Systems: Please see the history of present illness.   All other systems are reviewed and negative.   Physical Exam: VS:  There were no vitals taken for this visit. Marland Kitchen  BMI There is no height or weight on file to calculate BMI.  Wt Readings from Last 3 Encounters:  08/17/22 89.8 kg  07/12/22 90.7 kg  04/19/22 93.5 kg    Affect appropriate Healthy:  appears stated age HEENT: normal Neck supple with no adenopathy JVP normal no bruits no thyromegaly Lungs clear with no wheezing and good diaphragmatic motion Heart:  S1/S2 AR  murmur, no  rub, gallop or click PMI normal Abdomen: benighn, BS positve, no tenderness, no AAA no bruit.  No HSM or HJR Distal pulses intact with no bruits No edema Neuro non-focal Skin warm and dry No muscular weakness    LABORATORY DATA:  EKG:  12/24/19 afibr/flutter rate 92 otherwise normal 10/26/2022 Afib rate 95 normal otherwise  Lab Results  Component Value Date   WBC 6.5 10/24/2022   HGB 15.0 10/24/2022   HCT 42.8 10/24/2022   PLT 149 (L) 10/24/2022   GLUCOSE 104 (H) 10/24/2022   CHOL 203 (H) 10/27/2021   TRIG 77.0 10/27/2021   HDL 63.70 10/27/2021    LDLDIRECT 137.4 10/15/2010   LDLCALC 124 (H) 10/27/2021   ALT 48 10/27/2021   AST 37 10/27/2021   NA 139 10/24/2022   K 4.1 10/24/2022   CL 107 (H) 10/24/2022   CREATININE 0.93 10/24/2022   BUN 13 10/24/2022   CO2 17 (L) 10/24/2022   TSH 2.75 10/27/2021   PSA 3.65 10/27/2021   INR 1.1 09/25/2019     BNP (last 3 results) No results for input(s): "BNP" in the last 8760 hours.  ProBNP (last 3 results) No results for input(s): "PROBNP" in the last 8760 hours.   Other Studies Reviewed Today:  ECHO IMPRESSIONS 10/12/21   IMPRESSIONS     1. Left ventricular ejection fraction, by estimation, is 55 to 60%. The  left ventricle has normal function. The left ventricle has no regional  wall motion abnormalities. There is mild concentric left ventricular  hypertrophy. Left ventricular diastolic  function could not be evaluated.   2. Right ventricular systolic function is normal. The right ventricular  size is normal. There is normal pulmonary artery systolic pressure. The  estimated right ventricular systolic pressure is 28.8 mmHg.   3. Left atrial size was moderately dilated.   4. Right atrial size was moderately dilated.   5. The mitral valve is normal in structure. Mild to moderate mitral valve  regurgitation. No evidence of mitral stenosis.   6. Tricuspid valve regurgitation is mild to moderate.   7. The aortic valve is normal in structure. Aortic valve regurgitation is  mild. No aortic stenosis is present.   8. There is mild dilatation of the aortic root, measuring 40 mm. There is  mild dilatation of the ascending aorta, measuring 42 mm.   9. The inferior vena cava is normal in size with greater than 50%  respiratory variability, suggesting right atrial pressure of 3 mmHg.    Assessment/Plan:  1. Atrial fib - persistent successful [facility] 10/01/19 CHADVASC 0 ERA Repeat DCC done 12/16/19 on flecainide with again early failure Seen by Dr Johney Frame 03/02/20 deferred ablation Flecainide  caused severe headache Start Toprol 25 mg daily for rate control strategy Ablation with Dr Lalla Brothers December [place] mean time has had drop in EF with recurrence [facility] with Dr Cristal Deer to temporize before ablation   2. Anticoagulation -  No bleeding issues on eliquis    3. 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   4. Dilated aorta -  His father died with sudden death - this a presumed MI. Precautions were discussed (Bp control, no Valsalva, no Fluoroquinolones).     5. Mild to moderate AI - f/u echo 08/2020  CTA 10/12/21 stable 4.7 cm  Seen by Dr Dorris Fetch 10/2021 who will follow CTA 07/08/22 only measures 4.4 cm   5. Aortic Regurgitation:  tri leaflet AV in setting of aneurysm mild  by TTE 10/14/22  6. HTN:  Well controlled continue beta blocker    Signed: Charlton Haws, MD  10/26/2022 10:27 AM  [facility] [address] [place], [place]  [address] Phone: [phone] Fax: [phone]

## 2022-10-27 ENCOUNTER — Other Ambulatory Visit: Payer: Self-pay

## 2022-10-27 ENCOUNTER — Other Ambulatory Visit: Payer: PRIVATE HEALTH INSURANCE

## 2022-10-27 ENCOUNTER — Encounter (HOSPITAL_COMMUNITY)
Admission: RE | Disposition: A | Discharge status: Home / Self Care | Payer: Self-pay | Source: Home / Self Care | Attending: Cardiology

## 2022-10-27 ENCOUNTER — Ambulatory Visit (HOSPITAL_BASED_OUTPATIENT_CLINIC_OR_DEPARTMENT_OTHER): Payer: Medicare Other | Admitting: Anesthesiology

## 2022-10-27 ENCOUNTER — Ambulatory Visit (HOSPITAL_COMMUNITY)
Admission: RE | Admit: 2022-10-27 | Discharge: 2022-10-27 | Disposition: A | Discharge status: Home / Self Care | Payer: Medicare Other | Attending: Cardiology | Admitting: Cardiology

## 2022-10-27 ENCOUNTER — Encounter (HOSPITAL_COMMUNITY): Payer: Self-pay | Admitting: Cardiology

## 2022-10-27 ENCOUNTER — Ambulatory Visit (HOSPITAL_COMMUNITY): Payer: Medicare Other | Admitting: Anesthesiology

## 2022-10-27 DIAGNOSIS — F17210 Nicotine dependence, cigarettes, uncomplicated: Secondary | ICD-10-CM

## 2022-10-27 DIAGNOSIS — I4891 Unspecified atrial fibrillation: Secondary | ICD-10-CM

## 2022-10-27 DIAGNOSIS — I480 Paroxysmal atrial fibrillation: Secondary | ICD-10-CM

## 2022-10-27 DIAGNOSIS — I7121 Aneurysm of the ascending aorta, without rupture: Secondary | ICD-10-CM | POA: Diagnosis not present

## 2022-10-27 DIAGNOSIS — I491 Atrial premature depolarization: Secondary | ICD-10-CM | POA: Insufficient documentation

## 2022-10-27 DIAGNOSIS — I4510 Unspecified right bundle-branch block: Secondary | ICD-10-CM | POA: Diagnosis not present

## 2022-10-27 DIAGNOSIS — F17200 Nicotine dependence, unspecified, uncomplicated: Secondary | ICD-10-CM | POA: Insufficient documentation

## 2022-10-27 HISTORY — PX: CARDIOVERSION: SHX1299

## 2022-10-27 SURGERY — CARDIOVERSION
Anesthesia: General

## 2022-10-27 MED ORDER — OXYCODONE HCL 5 MG PO TABS: 5.0000 mg | ORAL_TABLET | Freq: Once | ORAL | Status: DC | PRN

## 2022-10-27 MED ORDER — FENTANYL CITRATE (PF) 100 MCG/2ML IJ SOLN: 25.0000 ug | INTRAMUSCULAR | Status: DC | PRN

## 2022-10-27 MED ORDER — SODIUM CHLORIDE 0.9 % IV SOLN: INTRAVENOUS | Status: DC

## 2022-10-27 MED ORDER — ONDANSETRON HCL 4 MG/2ML IJ SOLN: 4.0000 mg | Freq: Four times a day (QID) | INTRAMUSCULAR | Status: DC | PRN

## 2022-10-27 MED ORDER — PROPOFOL 10 MG/ML IV BOLUS
INTRAVENOUS | Status: DC | PRN
  Administered 2022-10-27: 70 mg via INTRAVENOUS

## 2022-10-27 MED ORDER — OXYCODONE HCL 5 MG/5ML PO SOLN: 5.0000 mg | Freq: Once | ORAL | Status: DC | PRN

## 2022-10-27 MED ORDER — LIDOCAINE 2% (20 MG/ML) 5 ML SYRINGE
INTRAMUSCULAR | Status: DC | PRN
  Administered 2022-10-27: 60 mg via INTRAVENOUS

## 2022-10-27 NOTE — Transfer of Care (Signed)
Immediate Anesthesia Transfer of Care Note  Patient: Sean [name]  Procedure(s) Performed: CARDIOVERSION  Patient Location: Endoscopy Unit  Anesthesia Type:General  Level of Consciousness: drowsy and patient cooperative  Airway & Oxygen Therapy: Patient Spontanous Breathing  Post-op Assessment: Report given to RN, Post -op Vital signs reviewed and stable, and Patient moving all extremities X 4  Post vital signs: Reviewed and stable  Last Vitals:  Vitals Value Taken Time  BP 103/95   Temp    Pulse 64   Resp 20   SpO2 94     Last Pain:  Vitals:   10/27/22 1016  TempSrc: Temporal  PainSc: 0-No pain         Complications: No notable events documented.

## 2022-10-27 NOTE — Discharge Instructions (Signed)

## 2022-10-27 NOTE — CV Procedure (Signed)
Procedure:   DCCV  Indication:  Symptomatic atrial atrial fibrillation  Procedure Note:  The patient signed informed consent.  They have had had therapeutic anticoagulation with apixaban greater than 3 weeks.  Anesthesia was administered by Dr. Chaney Malling.  Patient received 60 mg IV lidocaine and 70 mg IV propofol.Adequate airway was maintained throughout and vital followed per protocol.  They were cardioverted x 1 with 120J of biphasic synchronized energy.  They converted to NSR.  There were no apparent complications.  The patient had normal neuro status and respiratory status post procedure with vitals stable as recorded elsewhere.    Follow up:  They will continue on current medical therapy and follow up with cardiology as scheduled.  Jodelle Red, MD PhD 10/27/2022 10:39 AM

## 2022-10-27 NOTE — H&P (Signed)
History and Physical Interval Note: Please see H&P from Dr. Eden Emms dated 10/26/22. Note reviewed, agree with contents.   Sean [name] is a 66 y.o. male has presented today for surgery, with the diagnosis of Atrial Fibrillation  The various methods of treatment have been discussed with the patient and family. After consideration of risks, benefits and other options for treatment, the patient has consented to  Procedure(s): CARDIOVERSION (N/A) as a surgical intervention .  The patient's history has been reviewed, patient examined, no change in status, stable for surgery.  I have reviewed the patient's chart and labs.  Questions were answered to the patient's satisfaction.     Jodelle Red, MD PhD 10/27/2022 10:33 AM

## 2022-10-27 NOTE — Anesthesia Preprocedure Evaluation (Signed)
Anesthesia Evaluation  Patient identified by MRN, date of birth, ID band Patient awake    Reviewed: Allergy & Precautions, H&P , NPO status , Patient's Chart, lab work & pertinent test results  Airway Mallampati: II   Neck ROM: full    Dental   Pulmonary Current Smoker   breath sounds clear to auscultation       Cardiovascular + dysrhythmias Atrial Fibrillation  Rhythm:irregular Rate:Normal  Ascending aortic aneurysm   Neuro/Psych    GI/Hepatic   Endo/Other    Renal/GU      Musculoskeletal   Abdominal   Peds  Hematology   Anesthesia Other Findings   Reproductive/Obstetrics                             Anesthesia Physical Anesthesia Plan  ASA: 3  Anesthesia Plan: General   Post-op Pain Management:    Induction: Intravenous  PONV Risk Score and Plan: 1 and Propofol infusion and Treatment may vary due to age or medical condition  Airway Management Planned: Mask  Additional Equipment:   Intra-op Plan:   Post-operative Plan:   Informed Consent: I have reviewed the patients History and Physical, chart, labs and discussed the procedure including the risks, benefits and alternatives for the proposed anesthesia with the patient or authorized representative who has indicated his/her understanding and acceptance.     Dental advisory given  Plan Discussed with: CRNA, Anesthesiologist and Surgeon  Anesthesia Plan Comments:        Anesthesia Quick Evaluation

## 2022-10-28 ENCOUNTER — Encounter (HOSPITAL_COMMUNITY): Payer: Self-pay | Admitting: Cardiology

## 2022-10-31 NOTE — Anesthesia Postprocedure Evaluation (Signed)
Anesthesia Post Note  Patient: Sean [name]  Procedure(s) Performed: CARDIOVERSION     Patient location during evaluation: Endoscopy Anesthesia Type: General Level of consciousness: awake and alert Pain management: pain level controlled Vital Signs Assessment: post-procedure vital signs reviewed and stable Respiratory status: spontaneous breathing, nonlabored ventilation, respiratory function stable and patient connected to nasal cannula oxygen Cardiovascular status: blood pressure returned to baseline and stable Postop Assessment: no apparent nausea or vomiting Anesthetic complications: no   No notable events documented.  Last Vitals:  Vitals:   10/27/22 1059 10/27/22 1108  BP: 100/77 105/81  Pulse: (!) 52 (!) 50  Resp: 17 12  Temp:    SpO2: 93% 96%    Last Pain:  Vitals:   10/27/22 1108  TempSrc:   PainSc: 0-No pain                 Jaycob Mcclenton S

## 2022-11-03 ENCOUNTER — Telehealth: Payer: Self-pay | Admitting: Cardiology

## 2022-11-03 NOTE — Telephone Encounter (Signed)
New Messagre:    Please call, patient have questions about the Ablation.

## 2022-11-03 NOTE — Telephone Encounter (Signed)
See previous note. Patient's questions have been answered.

## 2022-11-03 NOTE — Telephone Encounter (Signed)
Patient has questions about his upcoming ablation.  He has been doing Nurse, mental health and the possibility of having to have a touch up after the ablation.  He wants to have shoulder surgery scheduled for January and wants to know if that would be possible to proceed with that procedure then.

## 2022-11-03 NOTE — Telephone Encounter (Signed)
Attempted phone call to pt and left a voicemail message to contact triage at [phone].

## 2022-11-03 NOTE — Telephone Encounter (Signed)
Spoke with the patient and answered questions about his ablation and shoulder surgery. They will contact us if they need cardiac clearance for surgery

## 2022-11-04 ENCOUNTER — Telehealth: Payer: Self-pay

## 2022-11-04 NOTE — Telephone Encounter (Signed)
   Patient Name: Sean [name]  DOB: 1956/02/14 MRN: [id number]  Primary Cardiologist: Charlton Haws, MD  Chart reviewed as part of pre-operative protocol coverage. Pt is scheduled for an ablation with Dr. Lalla Brothers on 11/17/2022.  Received surgical clearance request for right shoulder arthroscopy/rotator cuff repair on 12/26/2022.  I spoke with patient and notified him that he will require 3 months of uninterrupted anticoagulation therapy with Eliquis following his ablation, which would mean the earliest he could interrupt Eliquis therapy would be 02/16/2023.  I will route this recommendation to the requesting party via Epic fax function and remove from pre-op pool.  Please call with questions.  Joylene Grapes, NP 11/04/2022, 12:02 PM

## 2022-11-04 NOTE — Telephone Encounter (Signed)
...     Pre-operative Risk Assessment    Patient Name: Sean [name]  DOB: 26-May-1956 MRN: [id number]      Request for Surgical Clearance    Procedure:   RIGHT SHOULDER ARTHROSCOPY/RCR  Date of Surgery:  Clearance 12/26/22                                 Surgeon:  DR Caryn Bee SUPPLE Surgeon's Group or Practice Name:  [facility] Phone number:  [phone] Fax number:  [phone]   Type of Clearance Requested:   - Medical  - Pharmacy:  Hold Apixaban (Eliquis)     Type of Anesthesia:  General    Additional requests/questions:    Jola Babinski   11/04/2022, 11:34 AM

## 2022-11-15 ENCOUNTER — Telehealth: Payer: Self-pay | Admitting: Cardiovascular Disease

## 2022-11-15 NOTE — Telephone Encounter (Signed)
Spoke with pt. All questions were answered.

## 2022-11-15 NOTE — Telephone Encounter (Signed)
Patient would like a call back to discuss upcoming procedure.

## 2022-11-16 NOTE — Pre-Procedure Instructions (Signed)
Instructed patient on the following items: Arrival time 0515 Nothing to eat or drink after midnight No meds AM of procedure Responsible person to drive you home and stay with you for 24 hrs  Have you missed any doses of anti-coagulant Eliquis- hasn't missed

## 2022-11-17 ENCOUNTER — Ambulatory Visit (HOSPITAL_COMMUNITY)
Admission: RE | Admit: 2022-11-17 | Discharge: 2022-11-17 | Disposition: A | Discharge status: Home / Self Care | Payer: Medicare Other | Source: Ambulatory Visit | Attending: Cardiology | Admitting: Cardiology

## 2022-11-17 ENCOUNTER — Encounter (HOSPITAL_COMMUNITY)
Admission: RE | Disposition: A | Discharge status: Home / Self Care | Payer: PRIVATE HEALTH INSURANCE | Source: Ambulatory Visit | Attending: Cardiology

## 2022-11-17 ENCOUNTER — Other Ambulatory Visit (HOSPITAL_COMMUNITY): Payer: Self-pay

## 2022-11-17 ENCOUNTER — Ambulatory Visit (HOSPITAL_BASED_OUTPATIENT_CLINIC_OR_DEPARTMENT_OTHER): Payer: Medicare Other | Admitting: Anesthesiology

## 2022-11-17 ENCOUNTER — Other Ambulatory Visit: Payer: Self-pay

## 2022-11-17 ENCOUNTER — Ambulatory Visit (HOSPITAL_COMMUNITY): Payer: Medicare Other | Admitting: Anesthesiology

## 2022-11-17 DIAGNOSIS — I4819 Other persistent atrial fibrillation: Secondary | ICD-10-CM | POA: Insufficient documentation

## 2022-11-17 DIAGNOSIS — Z7901 Long term (current) use of anticoagulants: Secondary | ICD-10-CM | POA: Insufficient documentation

## 2022-11-17 DIAGNOSIS — F17210 Nicotine dependence, cigarettes, uncomplicated: Secondary | ICD-10-CM | POA: Diagnosis not present

## 2022-11-17 DIAGNOSIS — I739 Peripheral vascular disease, unspecified: Secondary | ICD-10-CM | POA: Diagnosis not present

## 2022-11-17 DIAGNOSIS — I4891 Unspecified atrial fibrillation: Secondary | ICD-10-CM | POA: Diagnosis not present

## 2022-11-17 DIAGNOSIS — Z79899 Other long term (current) drug therapy: Secondary | ICD-10-CM | POA: Diagnosis not present

## 2022-11-17 DIAGNOSIS — I509 Heart failure, unspecified: Secondary | ICD-10-CM | POA: Diagnosis not present

## 2022-11-17 DIAGNOSIS — F17290 Nicotine dependence, other tobacco product, uncomplicated: Secondary | ICD-10-CM | POA: Insufficient documentation

## 2022-11-17 DIAGNOSIS — I7121 Aneurysm of the ascending aorta, without rupture: Secondary | ICD-10-CM | POA: Diagnosis not present

## 2022-11-17 DIAGNOSIS — I110 Hypertensive heart disease with heart failure: Secondary | ICD-10-CM

## 2022-11-17 HISTORY — PX: ATRIAL FIBRILLATION ABLATION: EP1191

## 2022-11-17 LAB — POCT ACTIVATED CLOTTING TIME
Activated Clotting Time: 304 seconds
Activated Clotting Time: 320 seconds
Activated Clotting Time: 336 seconds

## 2022-11-17 SURGERY — ATRIAL FIBRILLATION ABLATION
Anesthesia: General

## 2022-11-17 MED ORDER — SODIUM CHLORIDE 0.9% FLUSH: 3.0000 mL | Freq: Two times a day (BID) | INTRAVENOUS | Status: DC

## 2022-11-17 MED ORDER — ONDANSETRON HCL 4 MG/2ML IJ SOLN: 4.0000 mg | Freq: Four times a day (QID) | INTRAMUSCULAR | Status: DC | PRN

## 2022-11-17 MED ORDER — PHENYLEPHRINE HCL-NACL 20-0.9 MG/250ML-% IV SOLN
INTRAVENOUS | Status: DC | PRN
  Administered 2022-11-17: 20 ug/min via INTRAVENOUS

## 2022-11-17 MED ORDER — COLCHICINE 0.6 MG PO TABS
0.6000 mg | ORAL_TABLET | Freq: Two times a day (BID) | ORAL | 0 refills | Status: DC
  Filled 2022-11-17: qty 10, 5d supply, fill #0

## 2022-11-17 MED ORDER — HEPARIN (PORCINE) IN NACL 1000-0.9 UT/500ML-% IV SOLN
INTRAVENOUS | Status: DC | PRN
  Administered 2022-11-17 (×2): 500 mL

## 2022-11-17 MED ORDER — PANTOPRAZOLE SODIUM 40 MG PO TBEC
40.0000 mg | DELAYED_RELEASE_TABLET | Freq: Every day | ORAL | 0 refills | Status: DC
  Filled 2022-11-17: qty 45, 45d supply, fill #0

## 2022-11-17 MED ORDER — ROCURONIUM BROMIDE 10 MG/ML (PF) SYRINGE
PREFILLED_SYRINGE | INTRAVENOUS | Status: DC | PRN
  Administered 2022-11-17: 50 mg via INTRAVENOUS
  Administered 2022-11-17: 30 mg via INTRAVENOUS

## 2022-11-17 MED ORDER — SODIUM CHLORIDE 0.9 % IV SOLN: 250.0000 mL | INTRAVENOUS | Status: DC | PRN

## 2022-11-17 MED ORDER — HEPARIN SODIUM (PORCINE) 1000 UNIT/ML IJ SOLN
INTRAMUSCULAR | Status: DC | PRN
  Administered 2022-11-17: 2000 [IU] via INTRAVENOUS
  Administered 2022-11-17: 3000 [IU] via INTRAVENOUS
  Administered 2022-11-17: 13000 [IU] via INTRAVENOUS

## 2022-11-17 MED ORDER — PROPOFOL 10 MG/ML IV BOLUS
INTRAVENOUS | Status: DC | PRN
  Administered 2022-11-17: 160 mg via INTRAVENOUS

## 2022-11-17 MED ORDER — COLCHICINE 0.6 MG PO TABS
0.6000 mg | ORAL_TABLET | Freq: Two times a day (BID) | ORAL | Status: DC
  Administered 2022-11-17: 0.6 mg via ORAL
  Filled 2022-11-17 (×2): qty 1

## 2022-11-17 MED ORDER — APIXABAN 5 MG PO TABS
5.0000 mg | ORAL_TABLET | Freq: Two times a day (BID) | ORAL | Status: DC
  Administered 2022-11-17: 5 mg via ORAL
  Filled 2022-11-17 (×2): qty 1

## 2022-11-17 MED ORDER — ONDANSETRON HCL 4 MG/2ML IJ SOLN
INTRAMUSCULAR | Status: DC | PRN
  Administered 2022-11-17: 4 mg via INTRAVENOUS

## 2022-11-17 MED ORDER — DEXAMETHASONE SODIUM PHOSPHATE 10 MG/ML IJ SOLN
INTRAMUSCULAR | Status: DC | PRN
  Administered 2022-11-17: 10 mg via INTRAVENOUS

## 2022-11-17 MED ORDER — PROTAMINE SULFATE 10 MG/ML IV SOLN
INTRAVENOUS | Status: DC | PRN
  Administered 2022-11-17: 10 mg via INTRAVENOUS
  Administered 2022-11-17: 20 mg via INTRAVENOUS

## 2022-11-17 MED ORDER — LIDOCAINE 2% (20 MG/ML) 5 ML SYRINGE
INTRAMUSCULAR | Status: DC | PRN
  Administered 2022-11-17: 60 mg via INTRAVENOUS

## 2022-11-17 MED ORDER — SODIUM CHLORIDE 0.9% FLUSH: 3.0000 mL | INTRAVENOUS | Status: DC | PRN

## 2022-11-17 MED ORDER — HEPARIN SODIUM (PORCINE) 1000 UNIT/ML IJ SOLN
INTRAMUSCULAR | Status: AC
  Filled 2022-11-17: qty 10

## 2022-11-17 MED ORDER — HEPARIN SODIUM (PORCINE) 1000 UNIT/ML IJ SOLN
INTRAMUSCULAR | Status: DC | PRN
  Administered 2022-11-17: 1000 [IU] via INTRAVENOUS

## 2022-11-17 MED ORDER — ACETAMINOPHEN 325 MG PO TABS: 650.0000 mg | ORAL_TABLET | ORAL | Status: DC | PRN

## 2022-11-17 MED ORDER — SODIUM CHLORIDE 0.9 % IV SOLN: INTRAVENOUS | Status: DC

## 2022-11-17 MED ORDER — PANTOPRAZOLE SODIUM 40 MG PO TBEC
40.0000 mg | DELAYED_RELEASE_TABLET | Freq: Every day | ORAL | Status: DC
  Administered 2022-11-17: 40 mg via ORAL
  Filled 2022-11-17 (×2): qty 1

## 2022-11-17 MED ORDER — FENTANYL CITRATE (PF) 250 MCG/5ML IJ SOLN
INTRAMUSCULAR | Status: DC | PRN
  Administered 2022-11-17: 100 ug via INTRAVENOUS

## 2022-11-17 MED ORDER — ACETAMINOPHEN 500 MG PO TABS
1000.0000 mg | ORAL_TABLET | Freq: Once | ORAL | Status: AC
  Administered 2022-11-17: 1000 mg via ORAL
  Filled 2022-11-17: qty 2

## 2022-11-17 MED ORDER — METOPROLOL SUCCINATE ER 50 MG PO TB24
50.0000 mg | ORAL_TABLET | Freq: Every day | ORAL | Status: DC
  Filled 2022-11-17: qty 1

## 2022-11-17 SURGICAL SUPPLY — 19 items
BAG SNAP BAND KOVER 36X36 (MISCELLANEOUS) IMPLANT
CATH 8FR REPROCESSED SOUNDSTAR (CATHETERS) ×1 IMPLANT
CATH 8FR SOUNDSTAR REPROCESSED (CATHETERS) IMPLANT
CATH ABLAT QDOT MICRO BI TC FJ (CATHETERS) IMPLANT
CATH OCTARAY 2.0 F 3-3-3-3-3 (CATHETERS) IMPLANT
CATH S-M CIRCA TEMP PROBE (CATHETERS) IMPLANT
CATH WEB BI DIR CSDF CRV REPRO (CATHETERS) IMPLANT
CLOSURE PERCLOSE PROSTYLE (VASCULAR PRODUCTS) IMPLANT
COVER SWIFTLINK CONNECTOR (BAG) ×1 IMPLANT
PACK EP LATEX FREE (CUSTOM PROCEDURE TRAY) ×1
PACK EP LF (CUSTOM PROCEDURE TRAY) ×1 IMPLANT
PAD DEFIB RADIO PHYSIO CONN (PAD) ×1 IMPLANT
PATCH CARTO3 (PAD) IMPLANT
SHEATH BAYLIS TRANSSEPTAL 98CM (NEEDLE) IMPLANT
SHEATH CARTO VIZIGO SM CVD (SHEATH) IMPLANT
SHEATH PINNACLE 8F 10CM (SHEATH) IMPLANT
SHEATH PINNACLE 9F 10CM (SHEATH) IMPLANT
SHEATH PROBE COVER 6X72 (BAG) IMPLANT
TUBING SMART ABLATE COOLFLOW (TUBING) IMPLANT

## 2022-11-17 NOTE — Discharge Instructions (Addendum)
Post procedure care instructions No driving for 4 days. No lifting over 5 lbs for 1 week. No vigorous or sexual activity for 1 week. You may return to work/your usual activities on 11/25/22. Keep procedure site clean & dry. If you notice increased pain, swelling, bleeding or pus, call/return!  You may shower after 24 hours, but no soaking in baths/hot tubs/pools for 1 week.   You have an appointment set up with the Atrial Fibrillation Clinic.  Multiple studies have shown that being followed by a dedicated atrial fibrillation clinic in addition to the standard care you receive from your other physicians improves health. We believe that enrollment in the atrial fibrillation clinic will allow [place] to better care for you.   The phone number to the Atrial Fibrillation Clinic is [phone]. The clinic is staffed Monday through Friday from 8:30am to 5pm.  Directions: The clinic is located in the [facility], 6TH FLOOR Enter the hospital at the MAIN ENTRANCE "A", use [employer] to the 6th floor.  Registration desk to the right of elevators on 6th floor  If you have any trouble locating the clinic, please don't hesitate to call [phone].     Cardiac Ablation, Care After  This sheet gives you information about how to care for yourself after your procedure. Your health care provider may also give you more specific instructions. If you have problems or questions, contact your health care provider. What can I expect after the procedure? After the procedure, it is common to have: Bruising around your puncture site. Tenderness around your puncture site. Skipped heartbeats. If you had an atrial fibrillation ablation, you may have atrial fibrillation during the first several months after your procedure.  Tiredness (fatigue).  Follow these instructions at home: Puncture site care  Follow instructions from your health care provider about how to take care of your puncture site. Make  sure you: If present, leave stitches (sutures), skin glue, or adhesive strips in place. These skin closures may need to stay in place for up to 2 weeks. If adhesive strip edges start to loosen and curl up, you may trim the loose edges. Do not remove adhesive strips completely unless your health care provider tells you to do that. If a large square bandage is present, this may be removed 24 hours after surgery.  Check your puncture site every day for signs of infection. Check for: Redness, swelling, or pain. Fluid or blood. If your puncture site starts to bleed, lie down on your back, apply firm pressure to the area, and contact your health care provider. Warmth. Pus or a bad smell. A pea or small marble sized lump at the site is normal and can take up to three months to resolve.  Driving Do not drive for at least 4 days after your procedure or however long your health care provider recommends. (Do not resume driving if you have previously been instructed not to drive for other health reasons.) Do not drive or use heavy machinery while taking prescription pain medicine. Activity Avoid activities that take a lot of effort for at least 7 days after your procedure. Do not lift anything that is heavier than 5 lb (4.5 kg) for one week.  No sexual activity for 1 week.  Return to your normal activities as told by your health care provider. Ask your health care provider what activities are safe for you. General instructions Take over-the-counter and prescription medicines only as told by your health care provider. Do  not use any products that contain nicotine or tobacco, such as cigarettes and e-cigarettes. If you need help quitting, ask your health care provider. You may shower after 24 hours, but Do not take baths, swim, or use a hot tub for 1 week.  Do not drink alcohol for 24 hours after your procedure. Keep all follow-up visits as told by your health care provider. This is important. Contact a  health care provider if: You have redness, mild swelling, or pain around your puncture site. You have fluid or blood coming from your puncture site that stops after applying firm pressure to the area. Your puncture site feels warm to the touch. You have pus or a bad smell coming from your puncture site. You have a fever. You have chest pain or discomfort that spreads to your neck, jaw, or arm. You have chest pain that is worse with lying on your back or taking a deep breath. You are sweating a lot. You feel nauseous. You have a fast or irregular heartbeat. You have shortness of breath. You are dizzy or light-headed and feel the need to lie down. You have pain or numbness in the arm or leg closest to your puncture site. Get help right away if: Your puncture site suddenly swells. Your puncture site is bleeding and the bleeding does not stop after applying firm pressure to the area. These symptoms may represent a serious problem that is an emergency. Do not wait to see if the symptoms will go away. Get medical help right away. Call your local emergency services (911 in the [place] Do not drive yourself to the hospital. Summary After the procedure, it is normal to have bruising and tenderness at the puncture site in your groin, neck, or forearm. Check your puncture site every day for signs of infection. Get help right away if your puncture site is bleeding and the bleeding does not stop after applying firm pressure to the area. This is a medical emergency. This information is not intended to replace advice given to you by your health care provider. Make sure you discuss any questions you have with your health care provider.  

## 2022-11-17 NOTE — Anesthesia Postprocedure Evaluation (Signed)
Anesthesia Post Note  Patient: Sean [name]  Procedure(s) Performed: ATRIAL FIBRILLATION ABLATION     Patient location during evaluation: PACU Anesthesia Type: General Level of consciousness: awake and alert Pain management: pain level controlled Vital Signs Assessment: post-procedure vital signs reviewed and stable Respiratory status: spontaneous breathing, nonlabored ventilation, respiratory function stable and patient connected to nasal cannula oxygen Cardiovascular status: blood pressure returned to baseline and stable Postop Assessment: no apparent nausea or vomiting Anesthetic complications: no  There were no known notable events for this encounter.  Last Vitals:  Vitals:   11/17/22 1200 11/17/22 1300  BP: 106/75 (!) 121/91  Pulse: 60 60  Resp: 15 13  Temp:    SpO2: 95% 96%    Last Pain:  Vitals:   11/17/22 1022  TempSrc: Temporal  PainSc: 0-No pain                 Kennieth Rad

## 2022-11-17 NOTE — Anesthesia Procedure Notes (Signed)
Procedure Name: Intubation Date/Time: 11/17/2022 7:52 AM  Performed by: Bryson Corona, CRNAPre-anesthesia Checklist: Patient identified, Emergency Drugs available, Suction available and Patient being monitored Patient Re-evaluated:Patient Re-evaluated prior to induction Oxygen Delivery Method: Circle System Utilized Preoxygenation: Pre-oxygenation with 100% oxygen Induction Type: IV induction Ventilation: Two handed mask ventilation required and Oral airway inserted - appropriate to patient size Laryngoscope Size: Mac and 4 Grade View: Grade I Tube type: Oral Tube size: 7.0 mm Number of attempts: 1 Airway Equipment and Method: Stylet and Oral airway Placement Confirmation: ETT inserted through vocal cords under direct vision, positive ETCO2 and breath sounds checked- equal and bilateral Secured at: 23 cm Tube secured with: Tape Dental Injury: Teeth and Oropharynx as per pre-operative assessment

## 2022-11-17 NOTE — Anesthesia Preprocedure Evaluation (Signed)
Anesthesia Evaluation  Patient identified by MRN, date of birth, ID band Patient awake    Reviewed: Allergy & Precautions, NPO status , Patient's Chart, lab work & pertinent test results  Airway Mallampati: III  TM Distance: >3 FB Neck ROM: Full    Dental  (+) Dental Advisory Given   Pulmonary Current Smoker and Patient abstained from smoking.   breath sounds clear to auscultation       Cardiovascular hypertension, Pt. on home beta blockers + Peripheral Vascular Disease and +CHF  + dysrhythmias Atrial Fibrillation  Rhythm:Regular Rate:Normal     Neuro/Psych negative neurological ROS     GI/Hepatic negative GI ROS, Neg liver ROS,,,  Endo/Other  negative endocrine ROS    Renal/GU negative Renal ROS     Musculoskeletal   Abdominal   Peds  Hematology negative hematology ROS (+)   Anesthesia Other Findings   Reproductive/Obstetrics                              Lab Results  Component Value Date   WBC 6.5 10/24/2022   HGB 15.0 10/24/2022   HCT 42.8 10/24/2022   MCV 101 (H) 10/24/2022   PLT 149 (L) 10/24/2022   Lab Results  Component Value Date   CREATININE 0.93 10/24/2022   BUN 13 10/24/2022   NA 139 10/24/2022   K 4.1 10/24/2022   CL 107 (H) 10/24/2022   CO2 17 (L) 10/24/2022    Anesthesia Physical Anesthesia Plan  ASA: 3  Anesthesia Plan: General   Post-op Pain Management: Tylenol PO (pre-op)*   Induction: Intravenous  PONV Risk Score and Plan: 1 and Dexamethasone, Ondansetron and Treatment may vary due to age or medical condition  Airway Management Planned: Oral ETT  Additional Equipment:   Intra-op Plan:   Post-operative Plan: Extubation in OR  Informed Consent: I have reviewed the patients History and Physical, chart, labs and discussed the procedure including the risks, benefits and alternatives for the proposed anesthesia with the patient or authorized  representative who has indicated his/her understanding and acceptance.     Dental advisory given  Plan Discussed with: CRNA  Anesthesia Plan Comments:          Anesthesia Quick Evaluation

## 2022-11-17 NOTE — H&P (Signed)
Electrophysiology Office Note:     Date:  11/17/2022   ID:  SHAKIM FAITH, DOB Feb 24, 1956, MRN [id number]   PCP:  Kristian Covey, MD      [facility] Cardiologist:  None  [facility] Electrophysiologist:  Lanier Prude, MD    Referring MD: Kristian Covey, MD    Chief Complaint: Follow-up   History of Present Illness:     Sean [name] is a 66 y.o. male who presents for follow-up. He was previously followed by Dr. Johney Frame. Initially seen 08/28/19 for new onset PAF. Their medical history includes atrial fibrillation. Successful DCC on 10/01/19 with relapse, and repeat [facility] on flecainide 12/16/2019 with early failure.   He was last seen by Dr. Johney Frame on 09/17/2021 where he was doing well.   He followed up with Dr. Eden Emms on 02/14/2022. Was tolerating Toprol 25 mg daily for rate control strategy. He preferred to stay on Eliquis despite CHADVASC 0. Since he is relatively young and very active they discussed referral to EP for a consult regarding ablation.   Prior to being on medication, he had noticed some dizziness and irregular heart beats. This would be his main indication that he would know he is in atrial fibrillation.    Previously he had developed severe headaches as a side effect to anti-arrhythmic medication.   He routinely goes to the [facility] and monitors his blood pressure. He remains active.   He denies any chest pain, shortness of breath, or peripheral edema. No syncope, orthopnea, or PND.    Today he presents for PVI. Doing well. No problems with his OAC.   Objective      Past Medical History:  Diagnosis Date   Atrial fibrillation [name])             Past Surgical History:  Procedure Laterality Date   CARDIOVERSION N/A 10/01/2019    Procedure: CARDIOVERSION;  Surgeon: Nahser, Deloris Ping, MD;  Location: [facility] ENDOSCOPY;  Service: Cardiovascular;  Laterality: N/A;   CARDIOVERSION N/A 12/16/2019    Procedure: CARDIOVERSION;  Surgeon: [name], MD;   Location: [facility] ENDOSCOPY;  Service: Cardiovascular;  Laterality: N/A;   CATARACT EXTRACTION, BILATERAL       EYE SURGERY   07/30/2019      Current Medications: Active Medications      Current Meds  Medication Sig   apixaban (ELIQUIS) 5 MG TABS tablet TAKE 1 TABLET BY MOUTH TWICE A DAY   doxycycline (VIBRA-TABS) 100 MG tablet Take 1 tablet daily starting 1 to 2 days prior to travel, during travel, and for 1 month after return   metoprolol succinate (TOPROL-XL) 25 MG 24 hr tablet Take 1 tablet (25 mg total) by mouth daily.   typhoid (VIVOTIF) DR capsule Take 1 capsule by mouth every other day.   zolpidem (AMBIEN) 10 MG tablet Take 1 tablet (10 mg total) by mouth at bedtime as needed. for sleep        Allergies:   Malarone [atovaquone-proguanil hcl] and Quinolones    Social History         Socioeconomic History   Marital status: Married      Spouse name: Not on file   Number of children: Not on file   Years of education: Not on file   Highest education level: Not on file  Occupational History   Not on file  Tobacco Use   Smoking status: Some Days      Types: Cigars   Smokeless tobacco: Never  Vaping Use   Vaping Use: Never used  Substance and Sexual Activity   Alcohol use: Yes      Alcohol/week: 4.0 - 5.0 standard drinks      Types: 4 - 5 Cans of beer per week   Drug use: No   Sexual activity: Not on file  Other Topics Concern   Not on file  Social History Narrative    Lives in [place] with wife and kids.    He is an Pensions consultant.         Social Determinants of Health    Financial Resource Strain: Not on file  Food Insecurity: Not on file  Transportation Needs: Not on file  Physical Activity: Not on file  Stress: Not on file  Social Connections: Not on file      Family History: The patient's family history includes Cancer (age of onset: 67) in his brother; Colon cancer in his mother.   ROS:   Please see the history of present illness.    All other systems  reviewed and are negative.   EKGs/Labs/Other Studies Reviewed:          Recent Labs: 10/27/2021: ALT 48; BUN 17; Creatinine, Ser 1.04; Hemoglobin 15.4; Platelets 207.0; Potassium 4.2; Sodium 137; TSH 2.75    Recent Lipid Panel Labs (Brief)          Component Value Date/Time    CHOL 203 (H) 10/27/2021 0815    TRIG 77.0 10/27/2021 0815    HDL 63.70 10/27/2021 0815    CHOLHDL 3 10/27/2021 0815    VLDL 15.4 10/27/2021 0815    LDLCALC 124 (H) 10/27/2021 0815    LDLDIRECT 137.4 10/15/2010 0908        Physical Exam:     VS:  BP 136/94   Pulse 87   Ht 6\' 1"  (1.854 m)   Wt 206 lb 3.2 oz (93.5 kg)   SpO2 96%   BMI 27.20 kg/m         Wt Readings from Last 3 Encounters:  04/19/22 206 lb 3.2 oz (93.5 kg)  03/16/22 204 lb 8 oz (92.8 kg)  02/14/22 209 lb (94.8 kg)      GEN: Well nourished, well developed in no acute distress HEENT: Normal NECK: No JVD; No carotid bruits LYMPHATICS: No lymphadenopathy CARDIAC: regular rhythm, no murmurs, rubs, gallops RESPIRATORY:  Clear to auscultation without rales, wheezing or rhonchi  ABDOMEN: Soft, non-tender, non-distended MUSCULOSKELETAL:  No edema; No deformity  SKIN: Warm and dry NEUROLOGIC:  Alert and oriented x 3 PSYCHIATRIC:  Normal affect          Assessment ASSESSMENT:     1. Persistent atrial fibrillation (HCC)   2. Dilated aortic root (HCC)     PLAN:     In order of problems listed above:   #Persistent atrial fibrillation Minimal symptom burden but the patient is interested in a rhythm control strategy.  We discussed the data supporting rhythm control in an otherwise healthy, relatively young patient.  We discussed antiarrhythmic drugs including dofetilide and amiodarone during today's visit.  He clearly would like to avoid a rhythm control strategy using antiarrhythmic drugs which I think is very reasonable.  I discussed the catheter ablation procedure in detail with the patient including the risks, recovery and  likelihood of success.  I discussed a success rate of about 70% for persistent atrial fibrillation ablations.  We discussed the possibility of needing a repeat ablation procedure.   Risk, benefits, and alternatives to EP  study and radiofrequency ablation for afib were also discussed in detail today. These risks include but are not limited to stroke, bleeding, vascular damage, tamponade, perforation, damage to the esophagus, lungs, and other structures, pulmonary vein stenosis, worsening renal function, and death. The patient understands these risk and wishes to proceed.  We will therefore proceed with catheter ablation at the next available time.  Carto, ICE, anesthesia are requested for the procedure.  Will also obtain CT PV protocol prior to the procedure to exclude LAA thrombus and further evaluate atrial anatomy.      Presents for PVI today. Procedure reviewed including risks and recovery and he wishes to proceed.     Signed, Rossie Muskrat. Lalla Brothers, MD, [facility], [facility] 11/17/2022 Electrophysiology [facility]

## 2022-11-17 NOTE — Transfer of Care (Signed)
Immediate Anesthesia Transfer of Care Note  Patient: Sean [name]  Procedure(s) Performed: ATRIAL FIBRILLATION ABLATION  Patient Location: Cath Lab  Anesthesia Type:General  Level of Consciousness: awake and alert   Airway & Oxygen Therapy: Patient Spontanous Breathing and Patient connected to nasal cannula oxygen  Post-op Assessment: Report given to RN and Post -op Vital signs reviewed and stable  Post vital signs: Reviewed and stable  Last Vitals:  Vitals Value Taken Time  BP 102/74 11/17/22 0952  Temp    Pulse 61 11/17/22 0956  Resp 14 11/17/22 0956  SpO2 99 % 11/17/22 0956  Vitals shown include unvalidated device data.  Last Pain:  Vitals:   11/17/22 0951  TempSrc: Temporal  PainSc:       Patients Stated Pain Goal: 3 (11/17/22 0539)  Complications: There were no known notable events for this encounter.

## 2022-11-18 ENCOUNTER — Encounter (HOSPITAL_COMMUNITY): Payer: Self-pay | Admitting: Cardiology

## 2022-11-23 ENCOUNTER — Telehealth: Payer: Self-pay | Admitting: Cardiovascular Disease

## 2022-11-23 ENCOUNTER — Telehealth: Payer: Self-pay

## 2022-11-23 NOTE — Telephone Encounter (Signed)
Call from patient, patient reports that he has been having chest tightness since yesterday. He states he went the gym yesterday at 1:30 and then noticed chest tightness and neck/back discomfort several hours later as he was reading. He states that today, neck/back discomfort is resolved but he still notices right sided chest tightness when he tries to take a deep breath. He denies SOB, palpitations and reports his HR as 80 bpm. He denies any cough or recent falls. He states discomfort does not worsen with exertion, only with taking deep breaths. He describes discomfort as 3/10 on pain scale.  Patient had an ablation with Dr. Lalla Brothers on 11/17/22 and I reviewed his discharge medications with him. He states he has been taking his eliquis but was under the impression that Dr. Lalla Brothers told him not to take the metoprolol succinate 50 mg daily that was listed on his discharge summary. Has follow up visit with afib clinic on 12/15/21, staff message sent to afib clinic for clarification on whether patient should resume metoprolol.

## 2022-11-23 NOTE — Telephone Encounter (Signed)
Pt c/o of Chest Pain: STAT if CP now or developed within 24 hours  1. Are you having CP right now? Having chest tightness right now   2. Are you experiencing any other symptoms (ex. SOB, nausea, vomiting, sweating)? States when he takes a full breathe he has some pain. States he had an ablation a week ago.   3. How long have you been experiencing CP? Started yesterday afternoon  4. Is your CP continuous or coming and going? Continuous   5. Have you taken Nitroglycerin? no ?

## 2022-11-23 NOTE — Telephone Encounter (Signed)
-----   Message from Shona Simpson, RN sent at 11/23/2022  9:09 AM EST ----- Regarding: RE: Discharge medications s/p Ablation Yes pt should continue metoprolol helps suppress breakthrough afib after the ablation during the healing phase. (More than likely lambert would stop at his 3 month follow up) He can use ibuprofen a day or 2 with food to help with chest tightness which is from inflammation related to healing process post ablation - If he has other questions he can call us at [phone] Thanks Stacy :) ----- Message ----- From: Luellen Pucker, RN Sent: 11/23/2022   8:51 AM EST To: Shona Simpson, RN; Newman Nip, NP Subject: Discharge medications s/p Ablation             Hello,  This patient had an ablation with Dr. Lalla Brothers on 11/17/22 and called in with right-sided "chest tightness" today (denies SOB, palpitations, HR 80) that started a few hours after he went to the gym yesterday.  He reports he only notices the chest tightness when he tries to take a deep breath. He states that he thought Dr. Lalla Brothers took him off his metoprolol succinate 50 mg on discharge, but the d/c summary looks like Dr. Lalla Brothers kept him on it. He has a follow up with the afib clinic for Jan 18, can you clarify whether he is supposed to be on that metoprolol?  Thanks,  Alcario Drought, RN

## 2022-11-23 NOTE — Telephone Encounter (Signed)
Spoke w/ Ventura Sellers, afib RN. Recommendations reviewed with patient. See associated telephone call.

## 2022-11-23 NOTE — Telephone Encounter (Signed)
Called patient back and reviewed afib clinic recommendations. He verbalizes understanding and states he will restart metoprolol. States he already took a tylenol this morning and is feeling better. Gave Stacy Carter's number for any further questions.

## 2022-12-07 ENCOUNTER — Other Ambulatory Visit: Payer: Self-pay | Admitting: Thoracic Surgery (Cardiothoracic Vascular Surgery)

## 2022-12-07 DIAGNOSIS — I7121 Aneurysm of the ascending aorta, without rupture: Secondary | ICD-10-CM

## 2022-12-15 ENCOUNTER — Ambulatory Visit (HOSPITAL_COMMUNITY)
Admission: RE | Admit: 2022-12-15 | Discharge: 2022-12-15 | Disposition: A | Discharge status: Home / Self Care | Payer: Medicare Other | Source: Ambulatory Visit | Attending: Nurse Practitioner | Admitting: Nurse Practitioner

## 2022-12-15 ENCOUNTER — Encounter (HOSPITAL_COMMUNITY): Payer: Self-pay | Admitting: Nurse Practitioner

## 2022-12-15 VITALS — BP 116/92 | HR 136 | Ht 74.0 in | Wt 196.0 lb

## 2022-12-15 DIAGNOSIS — I4892 Unspecified atrial flutter: Secondary | ICD-10-CM | POA: Insufficient documentation

## 2022-12-15 DIAGNOSIS — Z7901 Long term (current) use of anticoagulants: Secondary | ICD-10-CM | POA: Diagnosis not present

## 2022-12-15 DIAGNOSIS — D6869 Other thrombophilia: Secondary | ICD-10-CM

## 2022-12-15 DIAGNOSIS — I480 Paroxysmal atrial fibrillation: Secondary | ICD-10-CM

## 2022-12-15 MED ORDER — DILTIAZEM HCL 30 MG PO TABS: ORAL_TABLET | ORAL | 1 refills | Status: DC

## 2022-12-15 NOTE — Patient Instructions (Signed)
Cardizem 30mg -- Take 1 tablet every 4 hours AS NEEDED for AFIB heart rate >100 as long as top BP >100.    

## 2022-12-15 NOTE — Progress Notes (Signed)
Primary Care Physician: Eulas Post, MD Referring Physician: Dr. Quentin Ore Cardiologist: Dr. Alvie Heidelberg Sean [name] is a 67 y.o. male initially seen 08/28/19 for new onset PAF.  Successful DCCV on 10/01/19 with relapse, and repeat [facility] on flecainide 12/16/2019 with early failure.   He was last seen by Dr. Rayann Heman on 09/17/2021 where he was in afib rate controlled and did not want to try a rhythm control strategy. He was exercising and feeling well.    He followed up with Dr. Johnsie Cancel on 02/14/2022. Continued in afib.  Was tolerating Toprol 25 mg daily for rate control strategy. He preferred to stay on Eliquis despite CHADVASC 0. Since he is relatively young and very active they discussed referral to EP for a consult regarding ablation.  Echo 10/14/22- showed EF of 30-35% with moderately decreased function. He underwent cardioversion to SR.  He underwent afib ablation 11/17/22 with Dr. Quentin Ore.  Today in the afib clinic, atrial flutter at 136 bpm. He is unaware. States he has ben exercising at [employer] and doing well. [facility] too much attention to the pulse but has seen it elevated at times. He is taking toprol 50 mg daily. He is complaint with his eliquis 5 mg bid.     Today, he denies symptoms of palpitations, chest pain, shortness of breath, orthopnea, PND, lower extremity edema, dizziness, presyncope, syncope, or neurologic sequela. The patient is tolerating medications without difficulties and is otherwise without complaint today.   Past Medical History:  Diagnosis Date   Ascending aortic aneurysm ([place])    Atrial fibrillation [facility])    Past Surgical History:  Procedure Laterality Date   ATRIAL FIBRILLATION ABLATION N/A 11/17/2022   Procedure: ATRIAL FIBRILLATION ABLATION;  Surgeon: Vickie Epley, MD;  Location: [place] CV LAB;  Service: Cardiovascular;  Laterality: N/A;   CARDIOVERSION N/A 10/01/2019   Procedure: CARDIOVERSION;  Surgeon:  Acie Fredrickson Wonda Cheng, MD;  Location: [place];  Service: Cardiovascular;  Laterality: N/A;   CARDIOVERSION N/A 12/16/2019   Procedure: CARDIOVERSION;  Surgeon: Elouise Munroe, MD;  Location: [facility] ENDOSCOPY;  Service: Cardiovascular;  Laterality: N/A;   CARDIOVERSION N/A 10/27/2022   Procedure: CARDIOVERSION;  Surgeon: Buford Dresser, MD;  Location: [facility] ENDOSCOPY;  Service: Cardiovascular;  Laterality: N/A;   CATARACT EXTRACTION, BILATERAL     EYE SURGERY  07/30/2019    Current Outpatient Medications  Medication Sig Dispense Refill   apixaban (ELIQUIS) 5 MG TABS tablet Take 1 tablet (5 mg total) by mouth 2 (two) times daily. 180 tablet 2   metoprolol succinate (TOPROL-XL) 50 MG 24 hr tablet Take 1 tablet (50 mg total) by mouth daily. Take with or immediately following a meal. 90 tablet 3   pantoprazole (PROTONIX) 40 MG tablet Take 1 tablet (40 mg total) by mouth daily. 45 tablet 0   zolpidem (AMBIEN) 10 MG tablet Take 1 tablet (10 mg total) by mouth at bedtime as needed. for sleep 30 tablet 1   No current facility-administered medications for this encounter.    Allergies  Allergen Reactions   Malarone [Atovaquone-Proguanil Hcl]     Upset stomach, stomach pain   Quinolones     Aortic aneurysm    Social History   Socioeconomic History   Marital status: Married    Spouse name: Not on file   Number of children: Not on file   Years of education: Not on file   Highest education level: Not on  file  Occupational History   Not on file  Tobacco Use   Smoking status: Some Days    Types: Cigars   Smokeless tobacco: Never  Vaping Use   Vaping Use: Never used  Substance and Sexual Activity   Alcohol use: Yes    Alcohol/week: 4.0 - 5.0 standard drinks of alcohol    Types: 4 - 5 Cans of beer per week   Drug use: No   Sexual activity: Not on file  Other Topics Concern   Not on file  Social History Narrative   Lives in [place] with wife and kids.   He is an Pensions consultant.       Social Determinants of Health   Financial Resource Strain: Not on file  Food Insecurity: Not on file  Transportation Needs: Not on file  Physical Activity: Not on file  Stress: Not on file  Social Connections: Not on file  Intimate Partner Violence: Not on file    Family History  Problem Relation Age of Onset   Colon cancer Mother    Cancer Brother 24       testicular cancer    ROS- All systems are reviewed and negative except as per the HPI above  Physical Exam: Vitals:   12/15/22 1126  BP: (!) 116/92  Pulse: (!) 136  Weight: 88.9 kg  Height: 6\' 2"  (1.88 m)   Wt Readings from Last 3 Encounters:  12/15/22 88.9 kg  11/17/22 87.5 kg  10/27/22 90.7 kg    Labs: Lab Results  Component Value Date   NA 139 10/24/2022   K 4.1 10/24/2022   CL 107 (H) 10/24/2022   CO2 17 (L) 10/24/2022   GLUCOSE 104 (H) 10/24/2022   BUN 13 10/24/2022   CREATININE 0.93 10/24/2022   CALCIUM 9.1 10/24/2022   Lab Results  Component Value Date   INR 1.1 09/25/2019   Lab Results  Component Value Date   CHOL 203 (H) 10/27/2021   HDL 63.70 10/27/2021   LDLCALC 124 (H) 10/27/2021   TRIG 77.0 10/27/2021     GEN- The patient is well appearing, alert and oriented x 3 today.   Head- normocephalic, atraumatic Eyes-  Sclera clear, conjunctiva pink Ears- hearing intact Oropharynx- clear Neck- supple, no JVP Lymph- no cervical lymphadenopathy Lungs- Clear to ausculation bilaterally, normal work of breathing Heart- irregular rate and rhythm, no murmurs, rubs or gallops, PMI not laterally displaced GI- soft, NT, ND, + BS Extremities- no clubbing, cyanosis, or edema MS- no significant deformity or atrophy Skin- no rash or lesion Psych- euthymic mood, full affect Neuro- strength and sensation are intact  EKG-Vent. rate 136 BPM PR interval * ms QRS duration 96 ms QT/QTcB 332/499 ms P-R-T axes 95 48 49 Atrial flutter with 2:1 A-V conduction Incomplete right bundle branch  block Abnormal ECG When compared with ECG of 17-Nov-2022 09:56, PREVIOUS ECG IS PRESENT    Assessment and Plan:  1. Aflutter Pt had afib ablation 12/21 He is in aflutter today with elevated v rate at 136 bpm He is not symptomatic and was unaware [facility] He [facility] BP at the [facility] and at times has noted elevated HR but not always He will check his BP/HR for the next several days to see if arrhythmia  is persistent  He will return mid next week and if persistent will arrange for cardioversion I rx'ed 30 mg Cardizem to manage his elevated v rates over 100  bpm and if BP is over 568 systolic  I offered ED for urgent cardioversion today but pt deferred and instructed if he became symptomatic with aflutter before being seen next week to proceed ot the ED   2.CHA2DS2VASc  score of 1 States compliance with eliquis 5 mg bid   I reviewed findings with Dr. Quentin Ore who is in agreement with plan   Geroge Baseman. Eylin Pontarelli, [facility] [address] [place], [place] [address] [phone]

## 2022-12-21 ENCOUNTER — Encounter (HOSPITAL_COMMUNITY): Payer: Self-pay | Admitting: Nurse Practitioner

## 2022-12-21 ENCOUNTER — Ambulatory Visit (HOSPITAL_COMMUNITY)
Admission: RE | Admit: 2022-12-21 | Discharge: 2022-12-21 | Disposition: A | Discharge status: Home / Self Care | Payer: Medicare Other | Source: Ambulatory Visit | Attending: Nurse Practitioner | Admitting: Nurse Practitioner

## 2022-12-21 VITALS — BP 156/104 | HR 94 | Ht 74.0 in | Wt 197.0 lb

## 2022-12-21 DIAGNOSIS — I4892 Unspecified atrial flutter: Secondary | ICD-10-CM | POA: Diagnosis present

## 2022-12-21 DIAGNOSIS — Z7901 Long term (current) use of anticoagulants: Secondary | ICD-10-CM | POA: Diagnosis not present

## 2022-12-21 DIAGNOSIS — I480 Paroxysmal atrial fibrillation: Secondary | ICD-10-CM

## 2022-12-21 DIAGNOSIS — I4819 Other persistent atrial fibrillation: Secondary | ICD-10-CM | POA: Diagnosis not present

## 2022-12-21 DIAGNOSIS — I484 Atypical atrial flutter: Secondary | ICD-10-CM | POA: Insufficient documentation

## 2022-12-21 DIAGNOSIS — D6869 Other thrombophilia: Secondary | ICD-10-CM | POA: Diagnosis not present

## 2022-12-21 NOTE — Progress Notes (Signed)
Primary Care Physician: Eulas Post, MD Referring Physician: Dr. Quentin Ore Cardiologist: Dr. Alvie Heidelberg Sean [name] is a 67 y.o. male initially seen 08/28/19 for new onset PAF.  Successful DCCV on 10/01/19 with relapse, and repeat [facility] on flecainide 12/16/2019 with early failure.   He was last seen by Dr. Rayann Heman on 09/17/2021 where he was in afib rate controlled and did not want to try a rhythm control strategy. He was exercising and feeling well.    He followed up with Dr. Johnsie Cancel on 02/14/2022. Continued in afib.  Was tolerating Toprol 25 mg daily for rate control strategy. He preferred to stay on Eliquis despite CHADVASC 0. Since he is relatively young and very active they discussed referral to EP for a consult regarding ablation.  Echo 10/14/22- showed EF of 30-35% with moderately decreased function. He underwent cardioversion to SR.  He underwent afib ablation 11/17/22 with Dr. Quentin Ore.  Today in the afib clinic, 12/15/21, he is in atrial flutter at 136 bpm. He is unaware. States he has ben exercising at [employer] and doing well. [facility] and [facility] not pay too much attention to the pulse but has seen it elevated at times. He is taking toprol 50 mg daily. He is complaint with his eliquis 5 mg bid.   Return to afib clinic, 12/21/22. He has been in persistent AFib  since I  saw him last and will plan on cardioversion as previously discussed with Dr. Quentin Ore. He has been taking the Cardizem 30 mg as needed for HR's over 100 bpm, and  this has blunted his v response. He is still tolerating afib well .   Today, he denies symptoms of palpitations, chest pain, shortness of breath, orthopnea, PND, lower extremity edema, dizziness, presyncope, syncope, or neurologic sequela. The patient is tolerating medications without difficulties and is otherwise without complaint today.   Past Medical History:  Diagnosis Date   Ascending aortic aneurysm ([place])    Atrial fibrillation [facility])     Past Surgical History:  Procedure Laterality Date   ATRIAL FIBRILLATION ABLATION N/A 11/17/2022   Procedure: ATRIAL FIBRILLATION ABLATION;  Surgeon: Vickie Epley, MD;  Location: [place] CV LAB;  Service: Cardiovascular;  Laterality: N/A;   CARDIOVERSION N/A 10/01/2019   Procedure: CARDIOVERSION;  Surgeon: Acie Fredrickson Wonda Cheng, MD;  Location: [place];  Service: Cardiovascular;  Laterality: N/A;   CARDIOVERSION N/A 12/16/2019   Procedure: CARDIOVERSION;  Surgeon: Elouise Munroe, MD;  Location: [facility] ENDOSCOPY;  Service: Cardiovascular;  Laterality: N/A;   CARDIOVERSION N/A 10/27/2022   Procedure: CARDIOVERSION;  Surgeon: Buford Dresser, MD;  Location: [facility] ENDOSCOPY;  Service: Cardiovascular;  Laterality: N/A;   CATARACT EXTRACTION, BILATERAL     EYE SURGERY  07/30/2019    Current Outpatient Medications  Medication Sig Dispense Refill   apixaban (ELIQUIS) 5 MG TABS tablet Take 1 tablet (5 mg total) by mouth 2 (two) times daily. 180 tablet 2   diltiazem (CARDIZEM) 30 MG tablet Take 1 tablet every 4 hours AS NEEDED for AFIB heart rate >100 as long as top BP >100. 30 tablet 1   metoprolol succinate (TOPROL-XL) 50 MG 24 hr tablet Take 1 tablet (50 mg total) by mouth daily. Take with or immediately following a meal. 90 tablet 3   pantoprazole (PROTONIX) 40 MG tablet Take 1 tablet (40 mg total) by mouth daily. 45 tablet 0   zolpidem (AMBIEN) 10 MG tablet Take 1 tablet (10 mg total) by mouth  at bedtime as needed. for sleep 30 tablet 1   No current facility-administered medications for this encounter.    Allergies  Allergen Reactions   Malarone [Atovaquone-Proguanil Hcl]     Upset stomach, stomach pain   Quinolones     Aortic aneurysm    Social History   Socioeconomic History   Marital status: Married    Spouse name: Not on file   Number of children: Not on file   Years of education: Not on file   Highest education level: Not on file  Occupational History   Not on  file  Tobacco Use   Smoking status: Some Days    Types: Cigars   Smokeless tobacco: Never  Vaping Use   Vaping Use: Never used  Substance and Sexual Activity   Alcohol use: Yes    Alcohol/week: 4.0 - 5.0 standard drinks of alcohol    Types: 4 - 5 Cans of beer per week   Drug use: No   Sexual activity: Not on file  Other Topics Concern   Not on file  Social History Narrative   Lives in [place] with wife and kids.   He is an Forensic psychologist.      Social Determinants of Health   Financial Resource Strain: Not on file  Food Insecurity: Not on file  Transportation Needs: Not on file  Physical Activity: Not on file  Stress: Not on file  Social Connections: Not on file  Intimate Partner Violence: Not on file    Family History  Problem Relation Age of Onset   Colon cancer Mother    Cancer Brother 19       testicular cancer    ROS- All systems are reviewed and negative except as per the HPI above  Physical Exam: Vitals:   12/21/22 1545  BP: (!) 156/104  Pulse: 94  Weight: 89.4 kg  Height: 6' 2"$  (1.88 m)   Wt Readings from Last 3 Encounters:  12/21/22 89.4 kg  12/15/22 88.9 kg  11/17/22 87.5 kg    Labs: Lab Results  Component Value Date   NA 139 10/24/2022   K 4.1 10/24/2022   CL 107 (H) 10/24/2022   CO2 17 (L) 10/24/2022   GLUCOSE 104 (H) 10/24/2022   BUN 13 10/24/2022   CREATININE 0.93 10/24/2022   CALCIUM 9.1 10/24/2022   Lab Results  Component Value Date   INR 1.1 09/25/2019   Lab Results  Component Value Date   CHOL 203 (H) 10/27/2021   HDL 63.70 10/27/2021   LDLCALC 124 (H) 10/27/2021   TRIG 77.0 10/27/2021     GEN- The patient is well appearing, alert and oriented x 3 today.   Head- normocephalic, atraumatic Eyes-  Sclera clear, conjunctiva pink Ears- hearing intact Oropharynx- clear Neck- supple, no JVP Lymph- no cervical lymphadenopathy Lungs- Clear to ausculation bilaterally, normal work of breathing Heart- irregular rate and rhythm,  no murmurs, rubs or gallops, PMI not laterally displaced GI- soft, NT, ND, + BS Extremities- no clubbing, cyanosis, or edema MS- no significant deformity or atrophy Skin- no rash or lesion Psych- euthymic mood, full affect Neuro- strength and sensation are intact  EKG- Vent. rate 94 BPM PR interval * ms QRS duration 106 ms QT/QTcB 380/475 ms P-R-T axes * 68 63 Atrial flutter with variable A-V block Incomplete right bundle branch block Abnormal ECG When compared with ECG of 15-Dec-2022 11:34, PREVIOUS ECG IS PRESENT   Assessment and Plan:  1. Aflutter Pt had afib ablation 12/21  He  is in persistent atypical atrial flutter  He is not symptomatic and was unaware even thought he has had RVR with this  [facility] I rx'ed 30 mg Cardizem to manage his  v rates over 100 bpm and this has helped control his v rates   Cardioversion has been set up for 2/16  2.CHA2DS2VASc  score of 1 States compliance with eliquis 5 mg bid, states no missed doses    Butch Penny C. Charlis Harner, [facility] [address] [place], [place] [address] [phone]

## 2022-12-21 NOTE — Patient Instructions (Signed)
Cardioversion scheduled for: Friday, February 16th   - Arrive at the [facility] and go to admitting at [facility] not eat or drink anything after midnight the night prior to your procedure.   - Take all your morning medication (except diabetic medications) with a sip of water prior to arrival.  - You will not be able to drive home after your procedure.    - Do NOT miss any doses of your blood thinner - if you should miss a dose please notify our office immediately.   - If you feel as if you go back into normal rhythm prior to scheduled cardioversion, please notify our office immediately.  If your procedure is canceled in the cardioversion suite you will be charged a cancellation fee.

## 2022-12-21 NOTE — Addendum Note (Signed)
Encounter addended by: Juluis Mire, RN on: 12/21/2022 4:25 PM  Actions taken: Visit diagnoses modified, Order list changed, Diagnosis association updated

## 2022-12-21 NOTE — H&P (View-Only) (Signed)
Primary Care Physician: Eulas Post, MD Referring Physician: Dr. Quentin Ore Cardiologist: Dr. Alvie Heidelberg Sean [name] is a 67 y.o. male initially seen 08/28/19 for new onset PAF.  Successful DCCV on 10/01/19 with relapse, and repeat [facility] on flecainide 12/16/2019 with early failure.   He was last seen by Dr. Rayann Heman on 09/17/2021 where he was in afib rate controlled and did not want to try a rhythm control strategy. He was exercising and feeling well.    He followed up with Dr. Johnsie Cancel on 02/14/2022. Continued in afib.  Was tolerating Toprol 25 mg daily for rate control strategy. He preferred to stay on Eliquis despite CHADVASC 0. Since he is relatively young and very active they discussed referral to EP for a consult regarding ablation.  Echo 10/14/22- showed EF of 30-35% with moderately decreased function. He underwent cardioversion to SR.  He underwent afib ablation 11/17/22 with Dr. Quentin Ore.  Today in the afib clinic, 12/15/21, he is in atrial flutter at 136 bpm. He is unaware. States he has ben exercising at [employer] and doing well. [facility] and [facility] not pay too much attention to the pulse but has seen it elevated at times. He is taking toprol 50 mg daily. He is complaint with his eliquis 5 mg bid.   Return to afib clinic, 12/21/22. He has been in persistent AFib  since I  saw him last and will plan on cardioversion as previously discussed with Dr. Quentin Ore. He has been taking the Cardizem 30 mg as needed for HR's over 100 bpm, and  this has blunted his v response. He is still tolerating afib well .   Today, he denies symptoms of palpitations, chest pain, shortness of breath, orthopnea, PND, lower extremity edema, dizziness, presyncope, syncope, or neurologic sequela. The patient is tolerating medications without difficulties and is otherwise without complaint today.   Past Medical History:  Diagnosis Date   Ascending aortic aneurysm ([place])    Atrial fibrillation [facility])     Past Surgical History:  Procedure Laterality Date   ATRIAL FIBRILLATION ABLATION N/A 11/17/2022   Procedure: ATRIAL FIBRILLATION ABLATION;  Surgeon: Vickie Epley, MD;  Location: [place] CV LAB;  Service: Cardiovascular;  Laterality: N/A;   CARDIOVERSION N/A 10/01/2019   Procedure: CARDIOVERSION;  Surgeon: Acie Fredrickson Wonda Cheng, MD;  Location: [place];  Service: Cardiovascular;  Laterality: N/A;   CARDIOVERSION N/A 12/16/2019   Procedure: CARDIOVERSION;  Surgeon: Elouise Munroe, MD;  Location: [facility] ENDOSCOPY;  Service: Cardiovascular;  Laterality: N/A;   CARDIOVERSION N/A 10/27/2022   Procedure: CARDIOVERSION;  Surgeon: Buford Dresser, MD;  Location: [facility] ENDOSCOPY;  Service: Cardiovascular;  Laterality: N/A;   CATARACT EXTRACTION, BILATERAL     EYE SURGERY  07/30/2019    Current Outpatient Medications  Medication Sig Dispense Refill   apixaban (ELIQUIS) 5 MG TABS tablet Take 1 tablet (5 mg total) by mouth 2 (two) times daily. 180 tablet 2   diltiazem (CARDIZEM) 30 MG tablet Take 1 tablet every 4 hours AS NEEDED for AFIB heart rate >100 as long as top BP >100. 30 tablet 1   metoprolol succinate (TOPROL-XL) 50 MG 24 hr tablet Take 1 tablet (50 mg total) by mouth daily. Take with or immediately following a meal. 90 tablet 3   pantoprazole (PROTONIX) 40 MG tablet Take 1 tablet (40 mg total) by mouth daily. 45 tablet 0   zolpidem (AMBIEN) 10 MG tablet Take 1 tablet (10 mg total) by mouth  at bedtime as needed. for sleep 30 tablet 1   No current facility-administered medications for this encounter.    Allergies  Allergen Reactions   Malarone [Atovaquone-Proguanil Hcl]     Upset stomach, stomach pain   Quinolones     Aortic aneurysm    Social History   Socioeconomic History   Marital status: Married    Spouse name: Not on file   Number of children: Not on file   Years of education: Not on file   Highest education level: Not on file  Occupational History   Not on  file  Tobacco Use   Smoking status: Some Days    Types: Cigars   Smokeless tobacco: Never  Vaping Use   Vaping Use: Never used  Substance and Sexual Activity   Alcohol use: Yes    Alcohol/week: 4.0 - 5.0 standard drinks of alcohol    Types: 4 - 5 Cans of beer per week   Drug use: No   Sexual activity: Not on file  Other Topics Concern   Not on file  Social History Narrative   Lives in [place] with wife and kids.   He is an Forensic psychologist.      Social Determinants of Health   Financial Resource Strain: Not on file  Food Insecurity: Not on file  Transportation Needs: Not on file  Physical Activity: Not on file  Stress: Not on file  Social Connections: Not on file  Intimate Partner Violence: Not on file    Family History  Problem Relation Age of Onset   Colon cancer Mother    Cancer Brother 19       testicular cancer    ROS- All systems are reviewed and negative except as per the HPI above  Physical Exam: Vitals:   12/21/22 1545  BP: (!) 156/104  Pulse: 94  Weight: 89.4 kg  Height: 6' 2"$  (1.88 m)   Wt Readings from Last 3 Encounters:  12/21/22 89.4 kg  12/15/22 88.9 kg  11/17/22 87.5 kg    Labs: Lab Results  Component Value Date   NA 139 10/24/2022   K 4.1 10/24/2022   CL 107 (H) 10/24/2022   CO2 17 (L) 10/24/2022   GLUCOSE 104 (H) 10/24/2022   BUN 13 10/24/2022   CREATININE 0.93 10/24/2022   CALCIUM 9.1 10/24/2022   Lab Results  Component Value Date   INR 1.1 09/25/2019   Lab Results  Component Value Date   CHOL 203 (H) 10/27/2021   HDL 63.70 10/27/2021   LDLCALC 124 (H) 10/27/2021   TRIG 77.0 10/27/2021     GEN- The patient is well appearing, alert and oriented x 3 today.   Head- normocephalic, atraumatic Eyes-  Sclera clear, conjunctiva pink Ears- hearing intact Oropharynx- clear Neck- supple, no JVP Lymph- no cervical lymphadenopathy Lungs- Clear to ausculation bilaterally, normal work of breathing Heart- irregular rate and rhythm,  no murmurs, rubs or gallops, PMI not laterally displaced GI- soft, NT, ND, + BS Extremities- no clubbing, cyanosis, or edema MS- no significant deformity or atrophy Skin- no rash or lesion Psych- euthymic mood, full affect Neuro- strength and sensation are intact  EKG- Vent. rate 94 BPM PR interval * ms QRS duration 106 ms QT/QTcB 380/475 ms P-R-T axes * 68 63 Atrial flutter with variable A-V block Incomplete right bundle branch block Abnormal ECG When compared with ECG of 15-Dec-2022 11:34, PREVIOUS ECG IS PRESENT   Assessment and Plan:  1. Aflutter Pt had afib ablation 12/21  He  is in persistent atypical atrial flutter  He is not symptomatic and was unaware even thought he has had RVR with this  [facility] I rx'ed 30 mg Cardizem to manage his  v rates over 100 bpm and this has helped control his v rates   Cardioversion has been set up for 2/16  2.CHA2DS2VASc  score of 1 States compliance with eliquis 5 mg bid, states no missed doses    Butch Penny C. Catha Ontko, [facility] [address] [place],  [address] [phone]

## 2023-01-03 ENCOUNTER — Telehealth: Payer: Self-pay | Admitting: Cardiovascular Disease

## 2023-01-03 ENCOUNTER — Encounter (HOSPITAL_COMMUNITY): Payer: Self-pay

## 2023-01-03 NOTE — Telephone Encounter (Signed)
Patient was seen in Afib clinic, will route to Afib clinic and RN.

## 2023-01-03 NOTE — Telephone Encounter (Signed)
Cardioversion instructions reviewed with patient.

## 2023-01-03 NOTE — Telephone Encounter (Signed)
Patient would like a call back to discuss upcoming tests and procedure.

## 2023-01-05 ENCOUNTER — Encounter (HOSPITAL_COMMUNITY): Payer: Self-pay | Admitting: *Deleted

## 2023-01-06 ENCOUNTER — Other Ambulatory Visit (HOSPITAL_COMMUNITY): Payer: Self-pay | Admitting: Nurse Practitioner

## 2023-01-10 ENCOUNTER — Other Ambulatory Visit: Payer: Self-pay | Admitting: Family Medicine

## 2023-01-11 ENCOUNTER — Ambulatory Visit (HOSPITAL_COMMUNITY)
Admission: RE | Admit: 2023-01-11 | Discharge: 2023-01-11 | Disposition: A | Discharge status: Home / Self Care | Payer: Medicare Other | Source: Ambulatory Visit | Attending: Nurse Practitioner | Admitting: Nurse Practitioner

## 2023-01-11 DIAGNOSIS — I480 Paroxysmal atrial fibrillation: Secondary | ICD-10-CM

## 2023-01-11 LAB — BASIC METABOLIC PANEL
Anion gap: 6 (ref 5–15)
BUN: 11 mg/dL (ref 8–23)
CO2: 25 mmol/L (ref 22–32)
Calcium: 9 mg/dL (ref 8.9–10.3)
Chloride: 103 mmol/L (ref 98–111)
Creatinine, Ser: 0.97 mg/dL (ref 0.61–1.24)
GFR, Estimated: >60 mL/min (ref >60)
Glucose, Bld: 100 mg/dL — ABNORMAL HIGH (ref 70–99)
Potassium: 3.8 mmol/L (ref 3.5–5.1)
Sodium: 134 mmol/L — ABNORMAL LOW (ref 135–145)

## 2023-01-11 LAB — CBC
HCT: 42.8 % (ref 39.0–52.0)
Hemoglobin: 14.3 g/dL (ref 13.0–17.0)
MCH: 32.4 pg (ref 26.0–34.0)
MCHC: 33.4 g/dL (ref 30.0–36.0)
MCV: 96.8 fL (ref 80.0–100.0)
Platelets: 191 10*3/uL (ref 150–400)
RBC: 4.42 MIL/uL (ref 4.22–5.81)
RDW: 12.1 % (ref 11.5–15.5)
WBC: 5.5 10*3/uL (ref 4.0–10.5)
nRBC: 0 % (ref 0.0–0.2)

## 2023-01-13 ENCOUNTER — Encounter (HOSPITAL_COMMUNITY)
Admission: RE | Disposition: A | Discharge status: Home / Self Care | Payer: Self-pay | Source: Home / Self Care | Attending: Cardiology

## 2023-01-13 ENCOUNTER — Ambulatory Visit
Admission: RE | Admit: 2023-01-13 | Discharge: 2023-01-13 | Disposition: A | Discharge status: Home / Self Care | Payer: Medicare Other | Source: Ambulatory Visit | Attending: Thoracic Surgery (Cardiothoracic Vascular Surgery) | Admitting: Thoracic Surgery (Cardiothoracic Vascular Surgery)

## 2023-01-13 ENCOUNTER — Ambulatory Visit (HOSPITAL_BASED_OUTPATIENT_CLINIC_OR_DEPARTMENT_OTHER): Payer: Medicare Other | Admitting: Anesthesiology

## 2023-01-13 ENCOUNTER — Ambulatory Visit (HOSPITAL_COMMUNITY)
Admission: RE | Admit: 2023-01-13 | Discharge: 2023-01-13 | Disposition: A | Discharge status: Home / Self Care | Payer: Medicare Other | Attending: Cardiology | Admitting: Cardiology

## 2023-01-13 ENCOUNTER — Other Ambulatory Visit: Payer: Self-pay

## 2023-01-13 ENCOUNTER — Ambulatory Visit (HOSPITAL_COMMUNITY): Payer: Medicare Other | Admitting: Anesthesiology

## 2023-01-13 ENCOUNTER — Encounter (HOSPITAL_COMMUNITY): Payer: Self-pay | Admitting: Cardiology

## 2023-01-13 DIAGNOSIS — I4891 Unspecified atrial fibrillation: Secondary | ICD-10-CM

## 2023-01-13 DIAGNOSIS — I739 Peripheral vascular disease, unspecified: Secondary | ICD-10-CM | POA: Insufficient documentation

## 2023-01-13 DIAGNOSIS — I110 Hypertensive heart disease with heart failure: Secondary | ICD-10-CM | POA: Diagnosis not present

## 2023-01-13 DIAGNOSIS — I4819 Other persistent atrial fibrillation: Secondary | ICD-10-CM | POA: Diagnosis present

## 2023-01-13 DIAGNOSIS — F17210 Nicotine dependence, cigarettes, uncomplicated: Secondary | ICD-10-CM | POA: Diagnosis not present

## 2023-01-13 DIAGNOSIS — I509 Heart failure, unspecified: Secondary | ICD-10-CM | POA: Insufficient documentation

## 2023-01-13 DIAGNOSIS — Z79899 Other long term (current) drug therapy: Secondary | ICD-10-CM | POA: Insufficient documentation

## 2023-01-13 DIAGNOSIS — I7121 Aneurysm of the ascending aorta, without rupture: Secondary | ICD-10-CM

## 2023-01-13 DIAGNOSIS — I484 Atypical atrial flutter: Secondary | ICD-10-CM | POA: Diagnosis not present

## 2023-01-13 DIAGNOSIS — F17290 Nicotine dependence, other tobacco product, uncomplicated: Secondary | ICD-10-CM | POA: Insufficient documentation

## 2023-01-13 HISTORY — PX: CARDIOVERSION: SHX1299

## 2023-01-13 SURGERY — CARDIOVERSION
Anesthesia: General

## 2023-01-13 MED ORDER — PROPOFOL 10 MG/ML IV BOLUS
INTRAVENOUS | Status: DC | PRN
  Administered 2023-01-13: 45 mg via INTRAVENOUS
  Administered 2023-01-13: 25 mg via INTRAVENOUS

## 2023-01-13 MED ORDER — SODIUM CHLORIDE 0.9 % IV SOLN: INTRAVENOUS | Status: DC | PRN

## 2023-01-13 MED ORDER — IOPAMIDOL (ISOVUE-370) INJECTION 76%
75.0000 mL | Freq: Once | INTRAVENOUS | Status: AC | PRN
  Administered 2023-01-13: 75 mL via INTRAVENOUS

## 2023-01-13 MED ORDER — SODIUM CHLORIDE 0.9 % IV SOLN: INTRAVENOUS | Status: DC

## 2023-01-13 NOTE — Anesthesia Procedure Notes (Signed)
Procedure Name: General with mask airway Date/Time: 01/13/2023 9:16 AM  Performed by: Lavell Luster, CRNAPre-anesthesia Checklist: Patient identified, Emergency Drugs available, Suction available, Patient being monitored and Timeout performed Patient Re-evaluated:Patient Re-evaluated prior to induction Oxygen Delivery Method: Ambu bag Preoxygenation: Pre-oxygenation with 100% oxygen Induction Type: IV induction Ventilation: Mask ventilation without difficulty Placement Confirmation: breath sounds checked- equal and bilateral and positive ETCO2 Dental Injury: Teeth and Oropharynx as per pre-operative assessment

## 2023-01-13 NOTE — Anesthesia Preprocedure Evaluation (Addendum)
Anesthesia Evaluation  Patient identified by MRN, date of birth, ID band Patient awake    Reviewed: Allergy & Precautions, NPO status , Patient's Chart, lab work & pertinent test results  History of Anesthesia Complications Negative for: history of anesthetic complications  Airway Mallampati: III  TM Distance: >3 FB Neck ROM: Full    Dental  (+) Teeth Intact, Dental Advisory Given   Pulmonary Current Smoker and Patient abstained from smoking.   breath sounds clear to auscultation       Cardiovascular hypertension, Pt. on home beta blockers + Peripheral Vascular Disease and +CHF  + dysrhythmias Atrial Fibrillation  Rhythm:Irregular     Neuro/Psych negative neurological ROS     GI/Hepatic negative GI ROS, Neg liver ROS,,,  Endo/Other  negative endocrine ROS    Renal/GU negative Renal ROS     Musculoskeletal negative musculoskeletal ROS (+)    Abdominal   Peds  Hematology Lab Results      Component                Value               Date                      WBC                      5.5                 01/11/2023                HGB                      14.3                01/11/2023                HCT                      42.8                01/11/2023                MCV                      96.8                01/11/2023                PLT                      191                 01/11/2023            eliquis   Anesthesia Other Findings   Reproductive/Obstetrics                             Lab Results  Component Value Date   WBC 5.5 01/11/2023   HGB 14.3 01/11/2023   HCT 42.8 01/11/2023   MCV 96.8 01/11/2023   PLT 191 01/11/2023   Lab Results  Component Value Date   CREATININE 0.97 01/11/2023   BUN 11 01/11/2023   NA 134 (L) 01/11/2023   K 3.8 01/11/2023   CL 103 01/11/2023   CO2 25 01/11/2023    Anesthesia Physical Anesthesia  Plan  ASA: 3  Anesthesia Plan: General    Post-op Pain Management: Minimal or no pain anticipated   Induction: Intravenous  PONV Risk Score and Plan: 1 and Treatment may vary due to age or medical condition  Airway Management Planned: Natural Airway, Mask and Nasal Cannula  Additional Equipment: None  Intra-op Plan:   Post-operative Plan:   Informed Consent: I have reviewed the patients History and Physical, chart, labs and discussed the procedure including the risks, benefits and alternatives for the proposed anesthesia with the patient or authorized representative who has indicated his/her understanding and acceptance.     Dental advisory given  Plan Discussed with: CRNA  Anesthesia Plan Comments:         Anesthesia Quick Evaluation

## 2023-01-13 NOTE — Discharge Instructions (Signed)

## 2023-01-13 NOTE — Interval H&P Note (Signed)
History and Physical Interval Note:  01/13/2023 8:34 AM  Sean [name]  has presented today for surgery, with the diagnosis of AFIB.  The various methods of treatment have been discussed with the patient and family. After consideration of risks, benefits and other options for treatment, the patient has consented to  Procedure(s): CARDIOVERSION (N/A) as a surgical intervention.  The patient's history has been reviewed, patient examined, [name] change in status, stable for surgery.  I have reviewed the patient's chart and labs.  Questions were answered to the patient's satisfaction.     [name]

## 2023-01-13 NOTE — Transfer of Care (Signed)
Immediate Anesthesia Transfer of Care Note  Patient: Sean [name]  Procedure(s) Performed: CARDIOVERSION  Patient Location: Endoscopy Unit  Anesthesia Type:General  Level of Consciousness: awake, alert , and sedated  Airway & Oxygen Therapy: Patient Spontanous Breathing  Post-op Assessment: Post -op Vital signs reviewed and stable  Post vital signs: stable  Last Vitals:  Vitals Value Taken Time  BP    Temp    Pulse    Resp    SpO2      Last Pain:  Vitals:   01/13/23 0821  TempSrc: Temporal  PainSc: 0-No pain         Complications: No notable events documented.

## 2023-01-13 NOTE — CV Procedure (Signed)
   Electrical Cardioversion Procedure Note Sean [name] [id number] 08/08/1956  Procedure: Electrical Cardioversion Indications:  Atrial Fibrillation  Time Out: Verified patient identification, verified procedure,medications/allergies/relevent history reviewed, required imaging and test results available.  Performed  Procedure Details  The patient signed informed consent.   The patient was NPO past midnight. Has had therapeutic anticoagulation with Api greater than 3 weeks. The patient denies any interruption of anticoagulation.  Anesthesia was administered by Dr. Ermalene [name].  Adequate airway was maintained throughout and vital followed per protocol.  He was cardioverted x 1  with 200J of biphasic synchronized energy.  He converted to NSR.  There were no apparent complications.  The patient tolerated the procedure well and had normal neuro status and respiratory status post procedure with vitals stable as recorded elsewhere.     IMPRESSION:  Successful cardioversion of atrial fibrillation to sinus bradycardia with PACs   Follow up:  We will arrange follow up with primary cardiology.  He will continue on current medical therapy.  The patient advised to continue anticoagulation.  Sean [name] 01/13/2023, 9:19 AM

## 2023-01-14 NOTE — Anesthesia Postprocedure Evaluation (Signed)
Anesthesia Post Note  Patient: Sean [name]  Procedure(s) Performed: CARDIOVERSION     Patient location during evaluation: Endoscopy Anesthesia Type: General Level of consciousness: awake and alert Pain management: pain level controlled Vital Signs Assessment: post-procedure vital signs reviewed and stable Respiratory status: spontaneous breathing, nonlabored ventilation and respiratory function stable Cardiovascular status: stable Postop Assessment: no apparent nausea or vomiting Anesthetic complications: no   No notable events documented.  Last Vitals:  Vitals:   01/13/23 0930 01/13/23 0935  BP: 120/88 123/82  Pulse: (!) 52 (!) 53  Resp: 14 16  Temp: (!) 35.8 C   SpO2: 98% 97%    Last Pain:  Vitals:   01/13/23 0930  TempSrc: Temporal  PainSc:                  [name]

## 2023-01-15 ENCOUNTER — Encounter (HOSPITAL_COMMUNITY): Payer: Self-pay | Admitting: Cardiology

## 2023-01-17 ENCOUNTER — Ambulatory Visit (INDEPENDENT_AMBULATORY_CARE_PROVIDER_SITE_OTHER): Payer: Medicare Other | Admitting: Thoracic Surgery (Cardiothoracic Vascular Surgery)

## 2023-01-17 ENCOUNTER — Encounter: Payer: Self-pay | Admitting: Thoracic Surgery (Cardiothoracic Vascular Surgery)

## 2023-01-17 VITALS — BP 118/74 | HR 55 | Resp 18 | Ht 73.0 in | Wt 192.0 lb

## 2023-01-17 DIAGNOSIS — I7121 Aneurysm of the ascending aorta, without rupture: Secondary | ICD-10-CM

## 2023-01-17 NOTE — Progress Notes (Signed)
[address]       [place] [address]             [phone]      HPI: Sean [name] returns for follow-up of his ascending aortic aneurysm.  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  He was first noted to have a dilated aortic root and ascending aorta on echocardiogram.  A CT angiogram of the chest in 2022 showed a 4.6 cm ascending aneurysm.  I first saw him in December 2022.  I last saw him in August 2023.  He was doing well at that time.  In the interim since his last visit he has continued to feel well.  He did undergo an atrial fibrillation ablation on 11/17/2022 by Dr. Quentin Ore.  He developed persistent atrial flutter post procedure.  He underwent a cardioversion on 01/13/2023.  He feels well.  He denies any chest pain, pressure, tightness, or shortness of breath.  Past Medical History:  Diagnosis Date   Ascending aortic aneurysm (HCC)    Atrial fibrillation (HCC)     Current Outpatient Medications  Medication Sig Dispense Refill   apixaban (ELIQUIS) 5 MG TABS tablet Take 1 tablet (5 mg total) by mouth 2 (two) times daily. 180 tablet 2   metoprolol succinate (TOPROL-XL) 50 MG 24 hr tablet Take 1 tablet (50 mg total) by mouth daily. Take with or immediately following a meal. 90 tablet 3   zolpidem (AMBIEN) 10 MG tablet Take 1 tablet (10 mg total) by mouth at bedtime as needed. for sleep 30 tablet 1   diltiazem (CARDIZEM) 30 MG tablet TAKE 1 TABLET EVERY 4 HOURS AS NEEDED FOR AFIB HEART RATE >100 AS LONG AS TOP BP >100. (Patient not taking: Reported on 01/17/2023) 90 tablet 1   No current facility-administered medications for this visit.    Physical Exam BP 118/74 (BP Location: Left Arm, Patient Position: Sitting)   Pulse (!) 55   Resp 18   Ht 6' 1"$  (1.854 m)   Wt 192 lb (87.1 kg)   SpO2 98% Comment: RA  BMI 25.79 kg/m  67 year old man in no acute  distress Well-developed well-nourished Alert and oriented x 3 with no focal deficits Lungs clear bilaterally Cardiac regular rate and rhythm with normal S1 and S2 No peripheral edema  Diagnostic Tests: CT ANGIOGRAPHY CHEST WITH CONTRAST   TECHNIQUE: Multidetector CT imaging of the chest was performed using the standard protocol during bolus administration of intravenous contrast. Multiplanar CT image reconstructions and MIPs were obtained to evaluate the vascular anatomy.   RADIATION DOSE REDUCTION: This exam was performed according to the departmental dose-optimization program which includes automated exposure control, adjustment of the mA and/or kV according to patient size and/or use of iterative reconstruction technique.   CONTRAST:  75 mL Isovue 370 92m ISOVUE-370 IOPAMIDOL (ISOVUE-370) INJECTION 76%   COMPARISON:  July 08, 2022.   FINDINGS: Cardiovascular: Standard three-vessel branching pattern in the chest with signs of aortic atherosclerosis which is mild.   Ascending thoracic aorta transverse dimension of 4.5 cm previously at 4.5 cm. Descending thoracic aorta transverse dimension at 2.8 cm. Coronal measurements obtained of the ascending thoracic aorta of 4.3 cm.   Heart size top normal without pericardial effusion or nodularity.   Central pulmonary vasculature is unremarkable.   Mediastinum/Nodes: Thoracic inlet structures incompletely evaluated on this scan which terminates just at the RIGHT and LEFT lung  apices. No adenopathy in the chest. No acute process in the mediastinum.   Lungs/Pleura: No consolidation or pleural effusion. Minimal scarring in the RIGHT middle lobe. Airways are patent.   Upper Abdomen: Incidental imaging of upper abdominal contents without acute process.   Musculoskeletal: No chest wall lesion. No acute bone finding. No destructive bone process.   Review of the MIP images confirms the above findings.   IMPRESSION: 1. Stable  ascending thoracic aortic aneurysm at 4.5 cm greatest axial transverse dimension, measurements within 1 mm of previous size. Ascending thoracic aortic aneurysm. Recommend semi-annual imaging followup by CTA or MRA and referral to cardiothoracic surgery if not already obtained. This recommendation follows 2010 ACCF/AHA/AATS/ACR/ASA/SCA/SCAI/SIR/STS/SVM Guidelines for the Diagnosis and Management of Patients With Thoracic Aortic Disease. Circulation. 2010; [id number]. Aortic aneurysm NOS (ICD10-I71.9) note that when measured in the coronal plane maximal caliber is 4.3 cm. 2. Aortic atherosclerosis.   Aortic Atherosclerosis (ICD10-I70.0).     Electronically Signed   By: Zetta Bills M.D.   On: 01/13/2023 13:26 I personally reviewed the CT images.  4.5 cm ascending aneurysm and small, mostly thrombosed penetrating ulcer in the distal arch.  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    Ascending aneurysm - first noted to have an ascending aneurysm in 2022.  Has been stable on serial CT scans.  Also has a small penetrating ulcer that has been stable as well.  Needs continued semiannual follow-up.  Blood pressure is well-controlled.   Plan: Return in 6 months with CT angiogram of chest  Melrose Nakayama, MD [facility] [phone]

## 2023-01-19 ENCOUNTER — Ambulatory Visit (HOSPITAL_COMMUNITY): Payer: Medicare Other | Admitting: Nurse Practitioner

## 2023-01-23 ENCOUNTER — Other Ambulatory Visit (HOSPITAL_COMMUNITY): Payer: Self-pay

## 2023-01-23 ENCOUNTER — Other Ambulatory Visit (HOSPITAL_COMMUNITY): Payer: Self-pay | Admitting: Nurse Practitioner

## 2023-01-24 ENCOUNTER — Ambulatory Visit (HOSPITAL_COMMUNITY)
Admission: RE | Admit: 2023-01-24 | Discharge: 2023-01-24 | Disposition: A | Discharge status: Home / Self Care | Payer: Medicare Other | Source: Ambulatory Visit | Attending: Nurse Practitioner | Admitting: Nurse Practitioner

## 2023-01-24 ENCOUNTER — Encounter (HOSPITAL_COMMUNITY): Payer: Self-pay | Admitting: Nurse Practitioner

## 2023-01-24 VITALS — BP 110/78 | HR 81 | Ht 73.0 in | Wt 192.4 lb

## 2023-01-24 DIAGNOSIS — D6869 Other thrombophilia: Secondary | ICD-10-CM | POA: Diagnosis not present

## 2023-01-24 DIAGNOSIS — Z79899 Other long term (current) drug therapy: Secondary | ICD-10-CM | POA: Insufficient documentation

## 2023-01-24 DIAGNOSIS — Z7901 Long term (current) use of anticoagulants: Secondary | ICD-10-CM | POA: Insufficient documentation

## 2023-01-24 DIAGNOSIS — I480 Paroxysmal atrial fibrillation: Secondary | ICD-10-CM | POA: Diagnosis not present

## 2023-01-24 DIAGNOSIS — I491 Atrial premature depolarization: Secondary | ICD-10-CM | POA: Insufficient documentation

## 2023-01-24 DIAGNOSIS — I484 Atypical atrial flutter: Secondary | ICD-10-CM | POA: Insufficient documentation

## 2023-01-24 DIAGNOSIS — I4819 Other persistent atrial fibrillation: Secondary | ICD-10-CM | POA: Diagnosis not present

## 2023-01-24 DIAGNOSIS — I4519 Other right bundle-branch block: Secondary | ICD-10-CM | POA: Insufficient documentation

## 2023-01-24 NOTE — Progress Notes (Signed)
Primary Care Physician: Eulas Post, MD Referring Physician: Dr. Quentin Ore Cardiologist: Dr. Alvie Heidelberg Sean [name] is a 67 y.o. male initially seen 08/28/19 for new onset PAF.  Successful DCCV on 10/01/19 with relapse, and repeat [facility] on flecainide 12/16/2019 with early failure.   He was last seen by Dr. Rayann Heman on 09/17/2021 where he was in afib rate controlled and did not want to try a rhythm control strategy. He was exercising and feeling well.    He followed up with Dr. Johnsie Cancel on 02/14/2022. Continued in afib.  Was tolerating Toprol 25 mg daily for rate control strategy. He preferred to stay on Eliquis despite CHADVASC 0. Since he is relatively young and very active they discussed referral to EP for a consult regarding ablation.  Echo 10/14/22- showed EF of 30-35% with moderately decreased function. He underwent cardioversion to SR.  He underwent afib ablation 11/17/22 with Dr. Quentin Ore.  Today in the afib clinic, 12/15/21, he is in atrial flutter at 136 bpm. He is unaware. States he has ben exercising at [employer] and doing well[facility] and [facility] not pay too much attention to the pulse but has seen it elevated at times. He is taking toprol 50 mg daily. He is complaint with his eliquis 5 mg bid.   Return to afib clinic, 12/21/22. He has been in persistent AFib  since I  saw him last and will plan on cardioversion as previously discussed with Dr. Quentin Ore. He has been taking the Cardizem 30 mg as needed for HR's over 100 bpm, and  this has blunted his v response. He is still tolerating afib well .  F/u in afib clinic, 01/24/23. He had a successful cardioversion and remains in SR today. He feels improved.    Today, he denies symptoms of palpitations, chest pain, shortness of breath, orthopnea, PND, lower extremity edema, dizziness, presyncope, syncope, or neurologic sequela. The patient is tolerating medications without difficulties and is otherwise without complaint today.    Past Medical History:  Diagnosis Date   Ascending aortic aneurysm ([place])    Atrial fibrillation [facility])    Past Surgical History:  Procedure Laterality Date   ATRIAL FIBRILLATION ABLATION N/A 11/17/2022   Procedure: ATRIAL FIBRILLATION ABLATION;  Surgeon: Vickie Epley, MD;  Location: [facility];  Service: Cardiovascular;  Laterality: N/A;   CARDIOVERSION N/A 10/01/2019   Procedure: CARDIOVERSION;  Surgeon: Acie Fredrickson Wonda Cheng, MD;  Location: [place];  Service: Cardiovascular;  Laterality: N/A;   CARDIOVERSION N/A 12/16/2019   Procedure: CARDIOVERSION;  Surgeon: Elouise Munroe, MD;  Location: [place];  Service: Cardiovascular;  Laterality: N/A;   CARDIOVERSION N/A 10/27/2022   Procedure: CARDIOVERSION;  Surgeon: Buford Dresser, MD;  Location: [place];  Service: Cardiovascular;  Laterality: N/A;   CARDIOVERSION N/A 01/13/2023   Procedure: CARDIOVERSION;  Surgeon: Berniece Salines, DO;  Location: [place];  Service: Cardiovascular;  Laterality: N/A;   CATARACT EXTRACTION, BILATERAL     EYE SURGERY  07/30/2019    Current Outpatient Medications  Medication Sig Dispense Refill   apixaban (ELIQUIS) 5 MG TABS tablet Take 1 tablet (5 mg total) by mouth 2 (two) times daily. 180 tablet 2   diltiazem (CARDIZEM) 30 MG tablet TAKE 1 TABLET EVERY 4 HOURS AS NEEDED FOR AFIB HEART RATE >100 AS LONG AS TOP BP >100. 45 tablet 1   metoprolol succinate (TOPROL-XL) 50 MG 24 hr tablet Take 1 tablet (50 mg total) by mouth daily.  Take with or immediately following a meal. 90 tablet 3   zolpidem (AMBIEN) 10 MG tablet Take 1 tablet (10 mg total) by mouth at bedtime as needed. for sleep 30 tablet 1   No current facility-administered medications for this encounter.    Allergies  Allergen Reactions   Malarone [Atovaquone-Proguanil Hcl]     Upset stomach, stomach pain   Quinolones     Aortic aneurysm    Social History   Socioeconomic History   Marital status: Married     Spouse name: Not on file   Number of children: Not on file   Years of education: Not on file   Highest education level: Not on file  Occupational History   Not on file  Tobacco Use   Smoking status: Some Days    Types: Cigars   Smokeless tobacco: Never  Vaping Use   Vaping Use: Never used  Substance and Sexual Activity   Alcohol use: Yes    Alcohol/week: 4.0 - 5.0 standard drinks of alcohol    Types: 4 - 5 Cans of beer per week   Drug use: No   Sexual activity: Not on file  Other Topics Concern   Not on file  Social History Narrative   Lives in [place] with wife and kids.   He is an Forensic psychologist.      Social Determinants of Health   Financial Resource Strain: Not on file  Food Insecurity: Not on file  Transportation Needs: Not on file  Physical Activity: Not on file  Stress: Not on file  Social Connections: Not on file  Intimate Partner Violence: Not on file    Family History  Problem Relation Age of Onset   Colon cancer Mother    Cancer Brother 31       testicular cancer    ROS- All systems are reviewed and negative except as per the HPI above  Physical Exam: Vitals:   01/24/23 1454  BP: 110/78  Pulse: 81  Weight: 87.3 kg  Height: '6\' 1"'$  (1.854 m)   Wt Readings from Last 3 Encounters:  01/24/23 87.3 kg  01/17/23 87.1 kg  01/13/23 86.2 kg    Labs: Lab Results  Component Value Date   NA 134 (L) 01/11/2023   K 3.8 01/11/2023   CL 103 01/11/2023   CO2 25 01/11/2023   GLUCOSE 100 (H) 01/11/2023   BUN 11 01/11/2023   CREATININE 0.97 01/11/2023   CALCIUM 9.0 01/11/2023   Lab Results  Component Value Date   INR 1.1 09/25/2019   Lab Results  Component Value Date   CHOL 203 (H) 10/27/2021   HDL 63.70 10/27/2021   LDLCALC 124 (H) 10/27/2021   TRIG 77.0 10/27/2021     GEN- The patient is well appearing, alert and oriented x 3 today.   Head- normocephalic, atraumatic Eyes-  Sclera clear, conjunctiva pink Ears- hearing intact Oropharynx-  clear Neck- supple, no JVP Lymph- no cervical lymphadenopathy Lungs- Clear to ausculation bilaterally, normal work of breathing Heart- regular rate and rhythm, no murmurs, rubs or gallops, PMI not laterally displaced GI- soft, NT, ND, + BS Extremities- no clubbing, cyanosis, or edema MS- no significant deformity or atrophy Skin- no rash or lesion Psych- euthymic mood, full affect Neuro- strength and sensation are intact  EKG-  PR interval 184 ms QRS duration 100 ms QT/QTcB 382/443 ms P-R-T axes 65 61 68 Sinus rhythm with Premature supraventricular complexes Incomplete right bundle branch block Borderline ECG When compared with ECG  of 13-Jan-2023 09:22, Heart rate has increased Confirmed by Gwyndolyn Kaufman [phone]) on 01/24/2023 3:29:17 PM   Assessment and Plan:  1. Aflutter Pt had afib ablation 12/21 He  was  in persistent atypical atrial flutter  He is not symptomatic and was unaware  [facility] and [facility] He had successful cardioversion 2/16 and remains in SR today   2.CHA2DS2VASc  score of 1 States compliance with eliquis 5 mg bid, states no missed doses  He states he is planning on rotator cuff surgery early April Possibly will be removed from anticoagulation  on 3/20 when he follows up with Dr. Barnett Hatter C. Varie Machamer, [facility] [address] [place], [place] [address] [phone]

## 2023-02-02 ENCOUNTER — Telehealth: Payer: Self-pay

## 2023-02-02 NOTE — Telephone Encounter (Signed)
   Pre-operative Risk Assessment    Patient Name: Sean [name]  DOB: [birth year] MRN: [id number]     Request for Surgical Clearance    Procedure:  Right shoulder arthroplasty w/ RCR  Date of Surgery:  Clearance 02/28/23                                 Surgeon:  D. Justice Britain  Surgeon's Group or Practice Name:  [facility]  Phone number:  [phone] Fax number:  [phone]   Type of Clearance Requested:   - Pharmacy:  Hold Apixaban (Eliquis)     Type of Anesthesia:  Not indicated    Additional requests/questions:    [name]   02/02/2023, 3:40 PM

## 2023-02-03 NOTE — Telephone Encounter (Signed)
Primary Cardiologist:Peter Johnsie Cancel, MD  Chart reviewed as part of pre-operative protocol coverage. Because of Sean [name] past medical history and time since last visit, he/she will require a follow-up visit in order to better assess preoperative cardiovascular risk.  Pre-op covering staff: -Patient has an appointment with Dr. Quentin Ore on 02/15/2023 at which time clearance can be addressed. - Please contact requesting surgeon's office via preferred method (i.e, phone, fax) to inform them of need for appointment prior to surgery.  Cardioversion 01/13/23 and ablation 11/17/22. His procedure is far enough out from these two events Per office protocol, patient can hold Eliquis for 3 days prior to procedure.    Emmaline Life, NP-C  02/03/2023, 4:28 PM [address] Office [phone] Fax [phone]

## 2023-02-03 NOTE — Telephone Encounter (Signed)
Patient with diagnosis of afib on Eliquis for anticoagulation.    Procedure: Right shoulder arthroplasty w/ RCR  Date of procedure: 02/28/23   CHA2DS2-VASc Score = 2   This indicates a 2.2% annual risk of stroke. The patient's score is based upon: CHF History: 1 HTN History: 0 Diabetes History: 0 Stroke History: 0 Vascular Disease History: 0 Age Score: 1 Gender Score: 0      CrCl 89 ml/min  Cardioversion 01/13/23 and ablation 11/17/22. His procedure is far enough out from these two events  Per office protocol, patient can hold Eliquis for 3 days prior to procedure.    **This guidance is not considered finalized until pre-operative APP has relayed final recommendations.**

## 2023-02-06 ENCOUNTER — Other Ambulatory Visit (HOSPITAL_COMMUNITY): Payer: Self-pay | Admitting: Nurse Practitioner

## 2023-02-08 ENCOUNTER — Ambulatory Visit (INDEPENDENT_AMBULATORY_CARE_PROVIDER_SITE_OTHER): Payer: Medicare Other | Admitting: Family Medicine

## 2023-02-08 VITALS — BP 104/60 | HR 55 | Temp 97.7°F | Ht 73.23 in | Wt 192.8 lb

## 2023-02-08 DIAGNOSIS — I480 Paroxysmal atrial fibrillation: Secondary | ICD-10-CM | POA: Diagnosis not present

## 2023-02-08 DIAGNOSIS — Z125 Encounter for screening for malignant neoplasm of prostate: Secondary | ICD-10-CM

## 2023-02-08 DIAGNOSIS — Z85820 Personal history of malignant melanoma of skin: Secondary | ICD-10-CM

## 2023-02-08 DIAGNOSIS — Z23 Encounter for immunization: Secondary | ICD-10-CM | POA: Diagnosis not present

## 2023-02-08 DIAGNOSIS — Z0000 Encounter for general adult medical examination without abnormal findings: Secondary | ICD-10-CM | POA: Diagnosis not present

## 2023-02-08 DIAGNOSIS — E785 Hyperlipidemia, unspecified: Secondary | ICD-10-CM | POA: Diagnosis not present

## 2023-02-08 HISTORY — DX: Personal history of malignant melanoma of skin: Z85.820

## 2023-02-08 MED ORDER — ZOLPIDEM TARTRATE 10 MG PO TABS: 10.0000 mg | ORAL_TABLET | Freq: Every evening | ORAL | 1 refills | Status: DC | PRN

## 2023-02-08 NOTE — Progress Notes (Signed)
Established Patient Office Visit  Subjective   Patient ID: Sean [name], male    DOB: November 01, 1956  Age: 67 y.o. MRN: [id number]  No chief complaint on file.   HPI    [name] is here for welcome to Medicare physical exam.   1.  Risk factors based on Past Medical , Social, and Family history reviewed and as indicated above with no changes  -Past medical history significant for history of atrial fibrillation, dyslipidemia, history of colon polyps, history of melanoma, chronic intermittent insomnia, and ascending aortic aneurysm  Social history-he is a Hydrologist and still works full-time.  Quit smoking age 68.  Occasional cigar use.  Infrequent alcohol use.  Family history-father died age 76 unknown cause.  Possible stroke.  Mother had colon cancer and he had a brother died of complications of testicular cancer age 46.   2.  Limitations in physical activities None.  No recent falls.  Exercises regularly- [employer] and cycling.  Does have some mild paresthesias of both feet and feels off balance occasionally  3.  Depression/mood No active depression or anxiety issues PHQ 2 equals 0  4.  Hearing No deficits  5.  ADLs independent in all.  6.  Cognitive function (orientation to time and place, language, writing, speech,memory) no short or long term memory issues.  Language and judgement intact.  7.  Home Safety no issues.    8.  Height, weight, and visual acuity.all stable.  Wt Readings from Last 3 Encounters:  02/08/23 192 lb 12.8 oz (87.5 kg)  01/24/23 192 lb 6.4 oz (87.3 kg)  01/17/23 192 lb (87.1 kg)  Vision screen today was 20/30  right eye, 20/32 left eye, and 20/25 bilateral  9.  Counseling discussed -Counseled regarding age and gender appropriate preventative screenings and immunizations.   10. Recommendation of preventive services.  Recommend Prevnar 20.  This was given today.  Continue annual flu vaccine.  Consider tetanus booster at some point this  year  8. Labs based on risk factors-lipid, hepatic, PSA just had recent CBC and basic metabolic panel last month so these were not drawn   12. Care Plan-as below  13. Other Providers-   PCPs Type     Sean [name], Sean Sierras, MD General  Sean Epley, MD Electrophysiology  Sean Hector, MD Cardiology  -Also sees [facility] gastroenterology group and dermatologist    14. Written schedule of screening/prevention services given to patient.  Health Maintenance  Topic Date Due   Medicare Annual Wellness (AWV)  Never done   HIV Screening  Never done   DTaP/Tdap/Td (2 - Tdap) 11/28/2016   COVID-19 Vaccine (5 - 2023-24 season) 07/29/2022   INFLUENZA VACCINE  02/26/2023 (Originally 06/28/2022)   COLONOSCOPY (Pts 45-51yr Insurance coverage will need to be confirmed)  04/12/2023   Pneumonia Vaccine 68 Years old  Completed   Hepatitis C Screening  Completed   Zoster Vaccines- Shingrix  Completed   HPV VACCINES  Aged Out   15.  Advanced directives-he has a living will along with medical power of attorney  He has history of chronic intermittent insomnia takes Ambien as needed.  Requesting refills.  Review of Systems  Constitutional:  Negative for malaise/fatigue.  Eyes:  Negative for blurred vision.  Respiratory:  Negative for shortness of breath.   Cardiovascular:  Negative for chest pain.  Neurological:  Negative for dizziness, weakness and headaches.      Objective:     BP 104/60 (BP Location: Left  Arm, Patient Position: Sitting, Cuff Size: Normal)   Pulse (!) 55   Temp 97.7 F (36.5 C) (Oral)   Ht 6' 1.23" (1.86 m)   Wt 192 lb 12.8 oz (87.5 kg)   SpO2 98%   BMI 25.28 kg/m  BP Readings from Last 3 Encounters:  02/08/23 104/60  01/24/23 110/78  01/17/23 118/74   Wt Readings from Last 3 Encounters:  02/08/23 192 lb 12.8 oz (87.5 kg)  01/24/23 192 lb 6.4 oz (87.3 kg)  01/17/23 192 lb (87.1 kg)      Physical Exam Vitals reviewed.  Constitutional:       Appearance: He is well-developed.  HENT:     Right Ear: External ear normal.     Left Ear: External ear normal.  Eyes:     Pupils: Pupils are equal, round, and reactive to light.  Neck:     Thyroid: No thyromegaly.  Cardiovascular:     Rate and Rhythm: Normal rate and regular rhythm.  Pulmonary:     Effort: Pulmonary effort is normal. No respiratory distress.     Breath sounds: Normal breath sounds. No wheezing or rales.  Musculoskeletal:     Cervical back: Neck supple.     Right lower leg: No edema.     Left lower leg: No edema.  Neurological:     Mental Status: He is alert and oriented to person, place, and time.      No results found for any visits on 02/08/23.  Last CBC Lab Results  Component Value Date   WBC 5.5 01/11/2023   HGB 14.3 01/11/2023   HCT 42.8 01/11/2023   MCV 96.8 01/11/2023   MCH 32.4 01/11/2023   RDW 12.1 01/11/2023   PLT 191 [id number]   Last metabolic panel Lab Results  Component Value Date   GLUCOSE 100 (H) 01/11/2023   NA 134 (L) 01/11/2023   K 3.8 01/11/2023   CL 103 01/11/2023   CO2 25 01/11/2023   BUN 11 01/11/2023   CREATININE 0.97 01/11/2023   GFRNONAA >60 01/11/2023   CALCIUM 9.0 01/11/2023   PROT 7.2 10/27/2021   ALBUMIN 4.6 10/27/2021   BILITOT 0.9 10/27/2021   ALKPHOS 54 10/27/2021   AST 37 10/27/2021   ALT 48 10/27/2021   ANIONGAP 6 01/11/2023      The 10-year ASCVD risk score (Arnett DK, et al., 2019) is: 11%    Assessment & Plan:   #1 Welcome to Medicare visit.  We discussed the following health maintenance items  -He is in process of setting up repeat colonoscopy this May -Prevnar 20 given -Recommend continue annual flu vaccine -Check labs as above -Continue regular exercise habits with minimum of 150 minutes/week of moderate intensity exercise   Sean Littler, MD

## 2023-02-08 NOTE — Patient Instructions (Signed)
Set up repeat colonoscopy.  

## 2023-02-13 NOTE — Progress Notes (Unsigned)
  Electrophysiology Office Follow up Visit Note:    Date:  02/15/2023   ID:  Sean [name], DOB 1956/01/10, MRN [id number]  PCP:  Eulas Post, MD  [facility] Cardiologist:  Jenkins Rouge, MD  [facility] Electrophysiologist:  Vickie Epley, MD    Interval History:    Sean [name] is a 67 y.o. male who presents for a follow up visit.   He is s/p PVI 11/17/2022. During the ablation, the veins and posterior wall were ablated. Required DCCV 01/13/2023 for atrial flutter. Saw Butch Penny 01/24/2023 and was maintaining sinus rhythm. He has been doing well.  He has maintained normal rhythm after his procedure.  He is staying active.  He takes his Eliquis without bleeding issues.      Past medical, surgical, social and family history were reviewed.  ROS:   Please see the history of present illness.    All other systems reviewed and are negative.  EKGs/Labs/Other Studies Reviewed:    The following studies were reviewed today:    EKG:  The ekg ordered today demonstrates sinus rhythm.  Ventricular rate 53 bpm.   Physical Exam:    VS:  BP 118/70   Pulse (!) 53   Ht 6\' 1"  (1.854 m)   Wt 195 lb (88.5 kg)   SpO2 98%   BMI 25.73 kg/m     Wt Readings from Last 3 Encounters:  02/15/23 195 lb (88.5 kg)  02/08/23 192 lb 12.8 oz (87.5 kg)  01/24/23 192 lb 6.4 oz (87.3 kg)     GEN:  Well nourished, well developed in no acute distress CARDIAC: RRR, no murmurs, rubs, gallops RESPIRATORY:  Clear to auscultation without rales, wheezing or rhonchi       ASSESSMENT:    1. Persistent atrial fibrillation ([place])   2. Atrial flutter, unspecified type ([place])   3. Pre-op evaluation    PLAN:    In order of problems listed above:  #Persistent AF and AFL S/p ablation 10/2022. Had recurrence of AFL and required DCCV. On eliquis for stroke ppx. He is doing well now maintaining normal rhythm.  I have encouraged him to continue monitoring his heart rates and blood  pressures at home.  If he notices new symptoms or changes in his heart rate/rhythm he should let us know for [place] to move up his follow-up appointment to discuss options.  If he were to have recurrence in the future, favor redo catheter ablation given his young age and overall health status.  #PreOp Clearance He has an upcoming shoulder surgery scheduled.  He has no heart failure symptoms, no ischemic symptoms.  I think he is at acceptable risk to undergo orthopedic surgery.  It is okay for him to hold his anticoagulant for 3 days prior to the scheduled procedure and restart it when felt safe from a surgical perspective.  He should continue his metoprolol in the perioperative period.   Follow-up 1 year with APP.  Signed, Lars Mage, MD, [facility], [facility] 02/15/2023 1:54 PM    Electrophysiology [facility]

## 2023-02-15 ENCOUNTER — Ambulatory Visit: Payer: Medicare Other | Attending: Cardiology | Admitting: Cardiology

## 2023-02-15 ENCOUNTER — Encounter: Payer: Self-pay | Admitting: Cardiology

## 2023-02-15 VITALS — BP 118/70 | HR 53 | Ht 73.0 in | Wt 195.0 lb

## 2023-02-15 DIAGNOSIS — I4892 Unspecified atrial flutter: Secondary | ICD-10-CM | POA: Insufficient documentation

## 2023-02-15 DIAGNOSIS — I4819 Other persistent atrial fibrillation: Secondary | ICD-10-CM | POA: Insufficient documentation

## 2023-02-15 DIAGNOSIS — Z01818 Encounter for other preprocedural examination: Secondary | ICD-10-CM | POA: Insufficient documentation

## 2023-02-15 NOTE — Patient Instructions (Signed)
Medication Instructions:  Your physician recommends that you continue on your current medications as directed. Please refer to the Current Medication list given to you today.  *If you need a refill on your cardiac medications before your next appointment, please call your pharmacy*  Follow-Up: At [facility], you and your health needs are our priority.  As part of our continuing mission to provide you with exceptional heart care, we have created designated Provider Care Teams.  These Care Teams include your primary Cardiologist (physician) and Advanced Practice Providers (APPs -  Physician Assistants and Nurse Practitioners) who all work together to provide you with the care you need, when you need it.  Your next appointment:   1 year(s)  Provider:   You will see one of the following Advanced Practice Providers on your designated Care Team:   Renee Ursuy, PA-C Michael "Andy" Tillery, PA-C Suzann Riddle, NP  

## 2023-02-17 ENCOUNTER — Other Ambulatory Visit (INDEPENDENT_AMBULATORY_CARE_PROVIDER_SITE_OTHER): Payer: Medicare Other

## 2023-02-17 DIAGNOSIS — R972 Elevated prostate specific antigen [PSA]: Secondary | ICD-10-CM

## 2023-02-17 DIAGNOSIS — E785 Hyperlipidemia, unspecified: Secondary | ICD-10-CM

## 2023-02-17 DIAGNOSIS — Z125 Encounter for screening for malignant neoplasm of prostate: Secondary | ICD-10-CM

## 2023-02-17 LAB — LIPID PANEL
Cholesterol: 168 mg/dL (ref 0–200)
HDL: 51.1 mg/dL (ref >39.00)
LDL Cholesterol: 104 mg/dL — ABNORMAL HIGH (ref 0–99)
NonHDL: 117.19
Total CHOL/HDL Ratio: 3
Triglycerides: 67 mg/dL (ref 0.0–149.0)
VLDL: 13.4 mg/dL (ref 0.0–40.0)

## 2023-02-17 LAB — HEPATIC FUNCTION PANEL
ALT: 28 U/L (ref 0–53)
AST: 25 U/L (ref 0–37)
Albumin: 4.2 g/dL (ref 3.5–5.2)
Alkaline Phosphatase: 57 U/L (ref 39–117)
Bilirubin, Direct: 0.2 mg/dL (ref 0.0–0.3)
Total Bilirubin: 0.8 mg/dL (ref 0.2–1.2)
Total Protein: 6.5 g/dL (ref 6.0–8.3)

## 2023-02-17 LAB — PSA, MEDICARE: PSA: 4.97 ng/ml — ABNORMAL HIGH (ref 0.10–4.00)

## 2023-02-20 ENCOUNTER — Telehealth: Payer: Self-pay | Admitting: Family Medicine

## 2023-02-20 NOTE — Telephone Encounter (Signed)
Pt called, returning CMA's call. CMA was with a patient. Pt asked that CMA call back at his earliest convenience. 

## 2023-02-21 NOTE — Telephone Encounter (Signed)
Please see result note 

## 2023-03-31 ENCOUNTER — Telehealth: Payer: Self-pay | Admitting: *Deleted

## 2023-03-31 ENCOUNTER — Other Ambulatory Visit: Payer: Self-pay | Admitting: Urology

## 2023-03-31 DIAGNOSIS — R972 Elevated prostate specific antigen [PSA]: Secondary | ICD-10-CM

## 2023-03-31 NOTE — Telephone Encounter (Signed)
   Pre-operative Risk Assessment    Patient Name: Sean [name]  DOB: 10-08-1956 MRN: [id number]      Request for Surgical Clearance    Procedure:   PROSTATE Bx   Date of Surgery:  Clearance TBD                                 Surgeon:  DR. Rhoderick Moody Surgeon's Group or Practice Name:  [facility] Phone number:  [phone] Fax number:  [phone]   Type of Clearance Requested:   - Medical  - Pharmacy:  Hold Apixaban (Eliquis) x 3 DAYS PRIOR   Type of Anesthesia:  Local    Additional requests/questions:    Elpidio Anis   03/31/2023, 3:48 PM

## 2023-04-03 NOTE — Telephone Encounter (Signed)
   Name: Sean [name]  DOB: March 04, 1956  MRN: [id number]   Primary Cardiologist: Charlton Haws, MD  Chart reviewed as part of pre-operative protocol coverage. Sean [name] was last seen on 02/15/2023 by Dr. Lalla Brothers.  He was doing well at that time with no heart failure or ischemic symptoms.   Therefore, based on ACC/AHA guidelines, the patient would be at acceptable risk for the planned procedure without further cardiovascular testing.   Per Pharm D: Patient with diagnosis of afib on Eliquis for anticoagulation.     Procedure: prostate biopsy Date of procedure: TBD   CHA2DS2-VASc Score = 2  This indicates a 2.2% annual risk of stroke. The patient's score is based upon: CHF History: 1 HTN History: 0 Diabetes History: 0 Stroke History: 0 Vascular Disease History: 0 Age Score: 1 Gender Score: 0   DCCV 01/13/23, ablation 11/17/22.   CrCl 52mL/min Platelet count 191K   Per office protocol, patient can hold Eliquis for 3 days prior to procedure as requested.   I will route this recommendation to the requesting party via Epic fax function and remove from pre-op pool. Please call with questions.  Carlos Levering, NP 04/03/2023, 10:53 AM

## 2023-04-03 NOTE — Telephone Encounter (Signed)
Patient with diagnosis of afib on Eliquis for anticoagulation.    Procedure: prostate biopsy Date of procedure: TBD  CHA2DS2-VASc Score = 2  This indicates a 2.2% annual risk of stroke. The patient's score is based upon: CHF History: 1 HTN History: 0 Diabetes History: 0 Stroke History: 0 Vascular Disease History: 0 Age Score: 1 Gender Score: 0   DCCV 01/13/23, ablation 11/17/22.  CrCl 2mL/min Platelet count 191K  Per office protocol, patient can hold Eliquis for 3 days prior to procedure as requested.    **This guidance is not considered finalized until pre-operative APP has relayed final recommendations.**

## 2023-04-24 IMAGING — CT CT ANGIO CHEST
2 of 8 series · 16 of 46 positions shown · IV contrast (omnipaque)
Comparison: None.

CLINICAL DATA: Aortic disease.  History of atrial fibrillation.

EXAM:
CT ANGIOGRAPHY CHEST WITH CONTRAST
TECHNIQUE: Multidetector CT imaging of the chest was performed using the
standard protocol during bolus administration of intravenous
contrast. Multiplanar CT image reconstructions and MIPs were
obtained to evaluate the vascular anatomy.
CONTRAST:  100mL OMNIPAQUE IOHEXOL 350 MG/ML SOLN

[Series 4: aorta 3.0 bf37 2 · axial · 0.75mm/px · z∈[-303,-6]mm · 13 of 117 slices shown]
[im 9/117  lung]
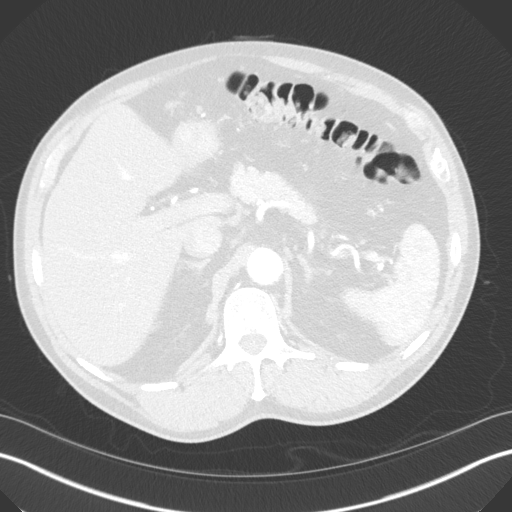
[im 17/117  soft-tissue]
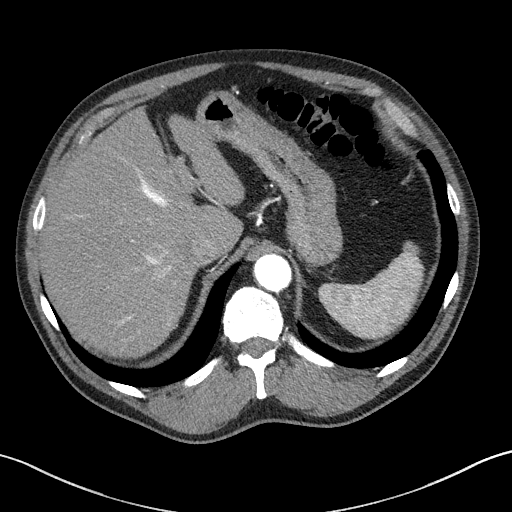
[im 25/117  lung]
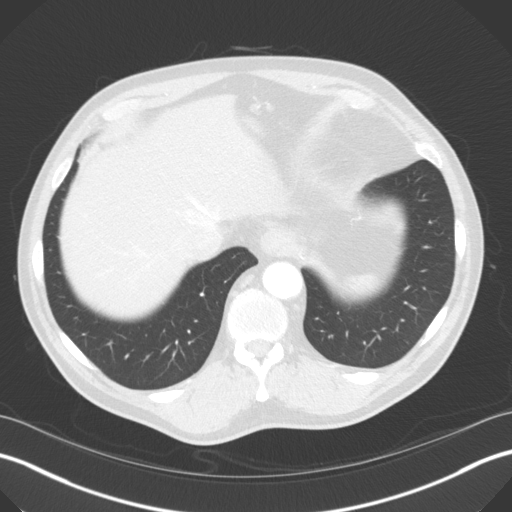
[im 34/117  soft-tissue]
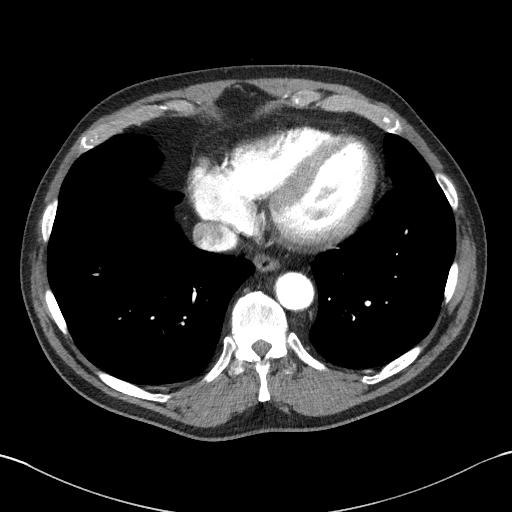
[im 42/117  lung]
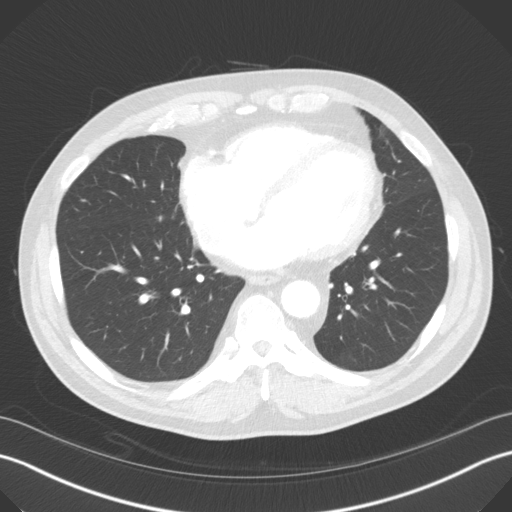
[im 50/117  soft-tissue]
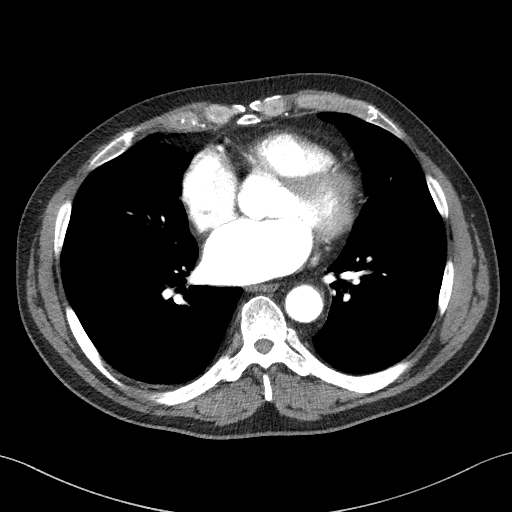
[im 59/117  lung]
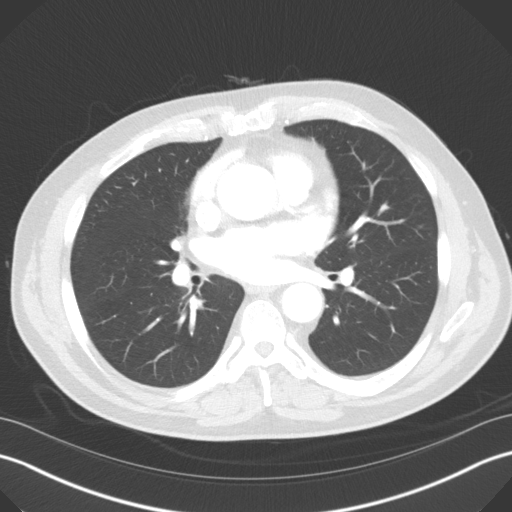
[im 67/117  soft-tissue]
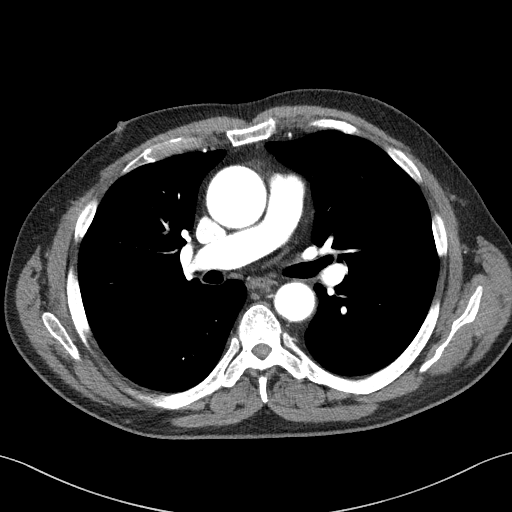
[im 75/117  lung]
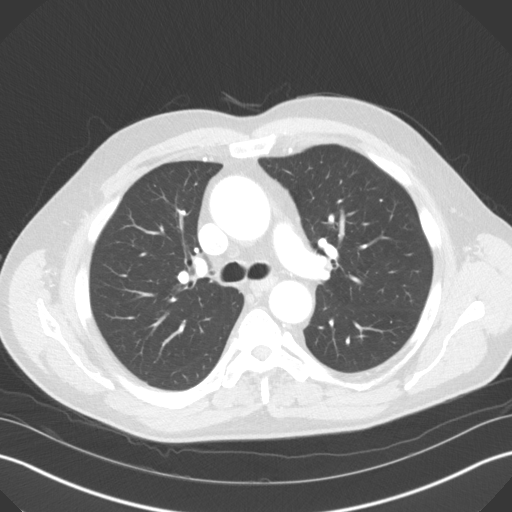
[im 83/117  soft-tissue]
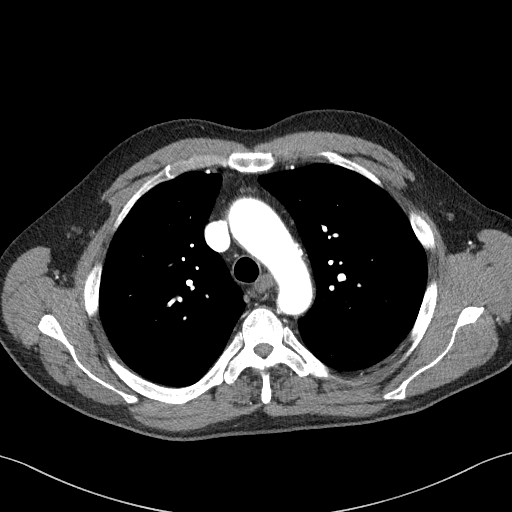
[im 92/117  lung]
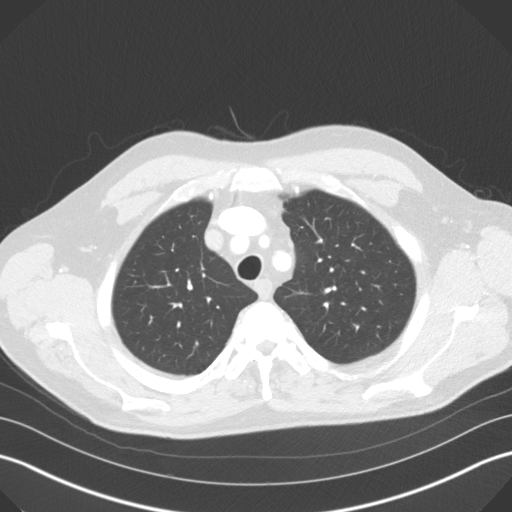
[im 100/117  soft-tissue]
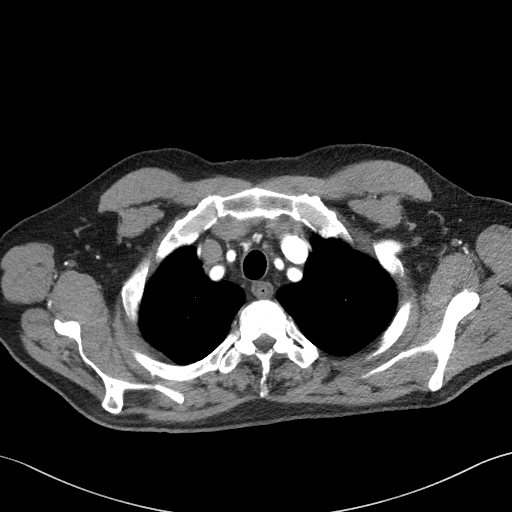
[im 108/117  lung]
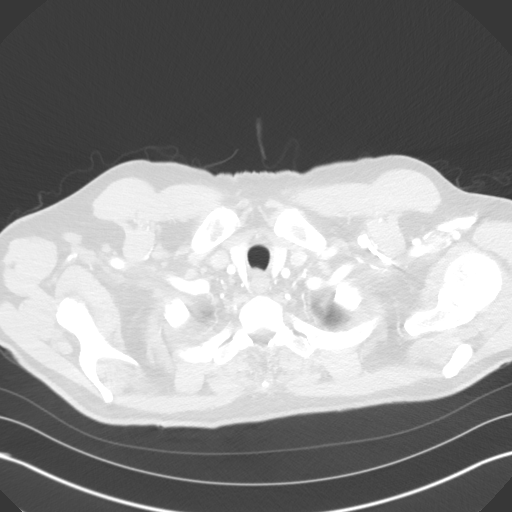

[Series 7: coronals · coronal · 0.70mm/px · 3 of 129 slices shown]
[im 33/129  soft-tissue]
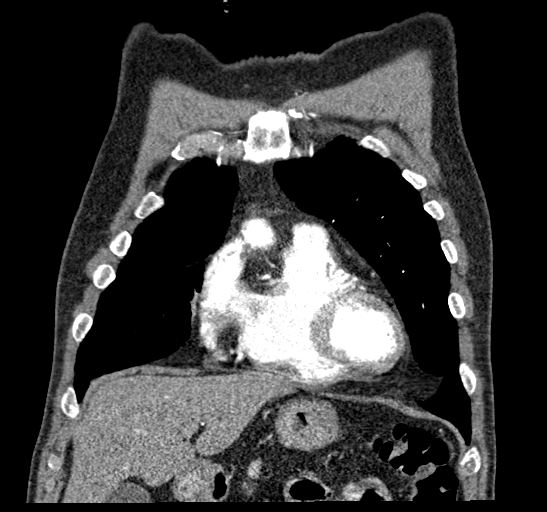
[im 65/129  soft-tissue]
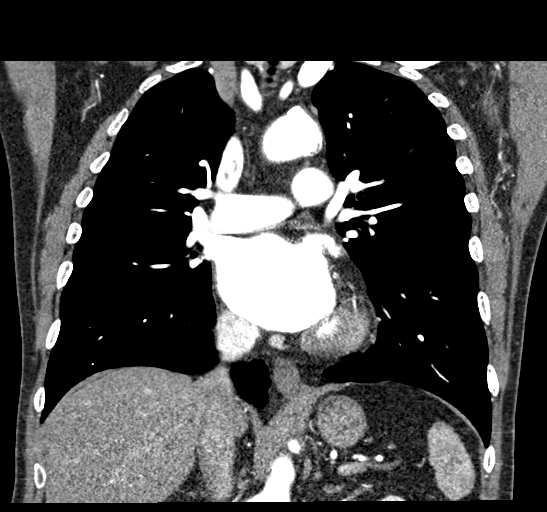
[im 97/129  soft-tissue]
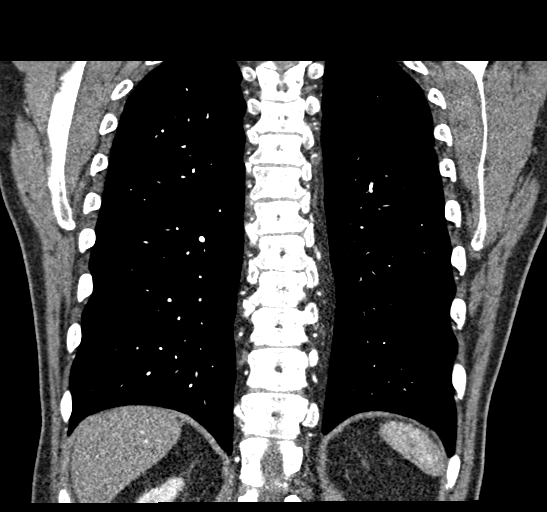

[16 of 46 positions shown; findings below may reference images not displayed]

FINDINGS: Vascular Findings:

Fusiform aneurysmal dilatation of the ascending thoracic aorta with
measurements as follows. The thoracic aorta tapers to a normal
caliber at the level of the aortic arch. Normal caliber of the
descending thoracic aorta. There is a minimal amount of calcified
atherosclerotic plaque involving the aortic arch and proximal
descending thoracic aorta, not resulting in a hemodynamically
significant stenosis. No evidence of thoracic aortic dissection or
perivascular stranding on this nongated examination.

Conventional configuration of the aortic arch. The branch vessels of
the aortic arch appear patent throughout their imaged courses. Note
is made of a small ductus diverticulum.

Cardiomegaly.  No pericardial effusion.

Although this examination was not tailored for the evaluation the
pulmonary arteries, there are no discrete filling defects within the
central pulmonary arterial tree to suggest central pulmonary
embolism. Normal caliber of the main pulmonary artery.

The celiac artery is noted to give rise to the splenic artery as
there is a replaced common hepatic artery arising from the proximal
SMA. Note is also made of an accessory left hepatic artery which
arises from the left gastric artery.

-------------------------------------------------------------

Thoracic aortic measurements:

SINOTUBULAR JUNCTION: 38 mm as measured in greatest oblique short
axis coronal dimension.

PROXIMAL ASCENDING THORACIC AORTA: 47 mm as measured in greatest
oblique short axis axial dimension at the level of the main
pulmonary artery (image 50, series 4) and approximately 47 mm in
greatest oblique short axis coronal diameter (coronal image 47,
series 7).

AORTIC ARCH: 32 mm as measured in greatest oblique short axis
sagittal dimension.

PROXIMAL DESCENDING THORACIC AORTA: 30 mm as measured in greatest
oblique short axis axial dimension at the level of the main
pulmonary artery.

DISTAL DESCENDING THORACIC AORTA: 28 mm as measured in greatest
oblique short axis axial dimension at the level of the diaphragmatic
hiatus.

Review of the MIP images confirms the above findings.

-------------------------------------------------------------

Non-Vascular Findings:

Mediastinum/Lymph Nodes: No bulky mediastinal, hilar or axillary
lymphadenopathy.

Lungs/Pleura: No focal airspace opacities. No pleural effusion or
pneumothorax. The central pulmonary airways appear widely patent.
There is a punctate (2 mm) left lower lobe pulmonary nodule (image
86, series 5). No additional discrete pulmonary nodules are
identified.

Upper abdomen: Limited early arterial phase evaluation of the upper
abdomen suggests mild diffuse decreased attenuation hepatic
parenchyma on this postcontrast examination suggestive of hepatic
steatosis.

Musculoskeletal: No acute or aggressive osseous abnormalities.
Straightening of the expected thoracic kyphosis regional soft
tissues appear normal. Normal appearance of the thyroid gland.
IMPRESSION: 1. Fusiform aneurysmal dilatation of the ascending thoracic aorta
measuring 47 mm in diameter. Ascending thoracic aortic aneurysm.
Recommend semi-annual imaging followup by CTA or MRA and referral to
cardiothoracic surgery if not already obtained. This recommendation
follows 6616 ACCF/AHA/AATS/ACR/ASA/SCA/MOATSHE/PAPEN/TWIST/DEEPTHI Guidelines
for the Diagnosis and Management of Patients With Thoracic Aortic
Disease. Circulation. 6616; 121: E266-e369. Aortic aneurysm NOS
(AEQ9T-JP7.L)
2. Cardiomegaly.
3.  Aortic Atherosclerosis (AEQ9T-CY2.2).
4. Suspected hepatic steatosis.  Correlation with LFTs is advised.
5. Solitary punctate (2 mm) left lower lobe nodule. No follow-up
needed if patient is low-risk. Non-contrast chest CT can be
considered in 12 months if patient is high-risk. This recommendation
follows the consensus statement: Guidelines for Management of
Incidental Pulmonary Nodules Detected on CT Images: From the

## 2023-05-17 ENCOUNTER — Ambulatory Visit
Admission: RE | Admit: 2023-05-17 | Discharge: 2023-05-17 | Disposition: A | Discharge status: Home / Self Care | Payer: Medicare Other | Source: Ambulatory Visit | Attending: Urology | Admitting: Urology

## 2023-05-17 DIAGNOSIS — R972 Elevated prostate specific antigen [PSA]: Secondary | ICD-10-CM

## 2023-05-17 MED ORDER — GADOPICLENOL 0.5 MMOL/ML IV SOLN
9.0000 mL | Freq: Once | INTRAVENOUS | Status: AC | PRN
  Administered 2023-05-17: 9 mL via INTRAVENOUS

## 2023-06-06 ENCOUNTER — Other Ambulatory Visit: Payer: Self-pay | Admitting: Thoracic Surgery (Cardiothoracic Vascular Surgery)

## 2023-06-06 DIAGNOSIS — I7121 Aneurysm of the ascending aorta, without rupture: Secondary | ICD-10-CM

## 2023-06-14 ENCOUNTER — Telehealth: Payer: Self-pay

## 2023-06-14 NOTE — Telephone Encounter (Signed)
Patient with diagnosis of afib on Eliquis for anticoagulation.   Patient had DCCV 01/13/23, ablation 11/17/22.   Procedure: Colonoscopy Date of procedure: TBD   CHA2DS2-VASc Score = 2   This indicates a 2.2% annual risk of stroke. The patient's score is based upon: CHF History: 1 HTN History: 0 Diabetes History: 0 Stroke History: 0 Vascular Disease History: 0 Age Score: 1 Gender Score: 0   CrCl 94 mL/min Platelet count 191 K 02/24    Per office protocol, patient can hold Eliquis  for 1 days prior to colonoscopy      **This guidance is not considered finalized until pre-operative APP has relayed final recommendations.**

## 2023-06-14 NOTE — Telephone Encounter (Signed)
LVM for call back to schedule pre-op cardiac clearance.

## 2023-06-14 NOTE — Telephone Encounter (Signed)
   Pre-operative Risk Assessment    Patient Name: Sean [name]  DOB: September 25, 1956 MRN: [id number]      Request for Surgical Clearance    Procedure:   Colonoscopy  Date of Surgery:  Clearance TBD                                 Surgeon:  Dr. Eleanora Neighbor Surgeon's Group or Practice Name:  [facility] Phone number:  [phone] Fax number:  [phone]   Type of Clearance Requested:   - Medical  - Pharmacy:  Hold Apixaban (Eliquis)     Type of Anesthesia:  Not Indicated   Additional requests/questions:    [name]   06/14/2023, 1:47 PM

## 2023-06-14 NOTE — Telephone Encounter (Signed)
   Name: Sean [name]  DOB: 1956-10-08  MRN: [id number]  Primary Cardiologist: Charlton Haws, MD   Preoperative team, please contact this patient and set up a phone call appointment for further preoperative risk assessment. Please obtain consent and complete medication review. Thank you for your help.  I confirm that guidance regarding antiplatelet and oral anticoagulation therapy has been completed and, if necessary, noted below.  Pharmacy has addressed recommendations for holding anticoagulation.   Ronney Asters, NP 06/14/2023, 3:33 PM [facility]

## 2023-06-15 ENCOUNTER — Telehealth: Payer: Self-pay

## 2023-06-15 NOTE — Telephone Encounter (Signed)
  Patient Consent for Virtual Visit         Sean [name] has provided verbal consent on 06/15/2023 for a virtual visit (video or telephone).   CONSENT FOR VIRTUAL VISIT FOR:  Sean [name]  By participating in this virtual visit I agree to the following:  I hereby voluntarily request, consent and authorize [facility] and its employed or contracted physicians, physician assistants, nurse practitioners or other licensed health care professionals (the Practitioner), to provide me with telemedicine health care services (the "Services") as deemed necessary by the treating Practitioner. I acknowledge and consent to receive the Services by the Practitioner via telemedicine. I understand that the telemedicine visit will involve communicating with the Practitioner through live audiovisual communication technology and the disclosure of certain medical information by electronic transmission. I acknowledge that I have been given the opportunity to request an in-person assessment or other available alternative prior to the telemedicine visit and am voluntarily participating in the telemedicine visit.  I understand that I have the right to withhold or withdraw my consent to the use of telemedicine in the course of my care at any time, without affecting my right to future care or treatment, and that the Practitioner or I may terminate the telemedicine visit at any time. I understand that I have the right to inspect all information obtained and/or recorded in the course of the telemedicine visit and may receive copies of available information for a reasonable fee.  I understand that some of the potential risks of receiving the Services via telemedicine include:  Delay or interruption in medical evaluation due to technological equipment failure or disruption; Information transmitted may not be sufficient (e.g. poor resolution of images) to allow for appropriate medical decision making by the  Practitioner; and/or  In rare instances, security protocols could fail, causing a breach of personal health information.  Furthermore, I acknowledge that it is my responsibility to provide information about my medical history, conditions and care that is complete and accurate to the best of my ability. I acknowledge that Practitioner's advice, recommendations, and/or decision may be based on factors not within their control, such as incomplete or inaccurate data provided by me or distortions of diagnostic images or specimens that may result from electronic transmissions. I understand that the practice of medicine is not an exact science and that Practitioner makes no warranties or guarantees regarding treatment outcomes. I acknowledge that a copy of this consent can be made available to me via my patient portal [facility] MyChart), or I can request a printed copy by calling the office of [facility].    I understand that my insurance will be billed for this visit.   I have read or had this consent read to me. I understand the contents of this consent, which adequately explains the benefits and risks of the Services being provided via telemedicine.  I have been provided ample opportunity to ask questions regarding this consent and the Services and have had my questions answered to my satisfaction. I give my informed consent for the services to be provided through the use of telemedicine in my medical care

## 2023-06-15 NOTE — Telephone Encounter (Signed)
Patient agreeable with telehealth appointment. Med list and consent reviewed. Patient voiced understanding.

## 2023-06-22 NOTE — Progress Notes (Signed)
GU Location of Tumor / Histology:  Prostate Cancer  If Prostate Cancer, Gleason Score is (3 + 4) and PSA is (4.97 on 02/17/2023)  Biopsies     05/17/2023 Dr.Christopher Winter MR Prostate with/without Contrast CLINICAL DATA: Elevated PSA level of 4.97 on 02/17/2023   IMPRESSION: 1. Tiny PI-RADS category 3 lesion of the left posterolateral peripheral zone in the mid gland. Targeting data sent to [facility]. 2. Mild prostatomegaly and benign prostatic hypertrophy.   Past/Anticipated interventions by urology, if any:  NA  Past/Anticipated interventions by medical oncology, if any: NA  Weight changes, if any:  Weight loss of 6 pounds.  IPSS:  5 SHIM:  25  Bowel/Bladder complaints, if any:  No  Nausea/Vomiting, if any: No  Pain issues, if any:  0/10  SAFETY ISSUES: Prior radiation? No Pacemaker/ICD? No Possible current pregnancy? Male Is the patient on methotrexate? No  Current Complaints / other details:

## 2023-06-26 ENCOUNTER — Ambulatory Visit: Payer: Medicare Other | Attending: Cardiovascular Disease

## 2023-06-26 DIAGNOSIS — Z01810 Encounter for preprocedural cardiovascular examination: Secondary | ICD-10-CM | POA: Diagnosis not present

## 2023-06-26 NOTE — Progress Notes (Signed)
Virtual Visit via Telephone Note   Because of Sean [name] co-morbid illnesses, he is at least at moderate risk for complications without adequate follow up.  This format is felt to be most appropriate for this patient at this time.  The patient did not have access to video technology/had technical difficulties with video requiring transitioning to audio format only (telephone).  All issues noted in this document were discussed and addressed.  No physical exam could be performed with this format.  Please refer to the patient's chart for his consent to telehealth for [facility].  Evaluation Performed:  Preoperative cardiovascular risk assessment _____________   Date:  06/26/2023   Patient ID:  Sean [name], DOB 1956-06-15, MRN [id number] Patient Location:  Home Provider location:   Office  Primary Care Provider:  Kristian Covey, MD Primary Cardiologist:  Charlton Haws, MD  Chief Complaint / Patient Profile   67 y.o. y/o male with a h/o paroxysmal AF, who is pending colonoscopy and presents today for telephonic preoperative cardiovascular risk assessment.  History of Present Illness    Sean [name] is a 67 y.o. male who presents via audio/video conferencing for a telehealth visit today.  Pt was last seen in cardiology clinic on 02/15/2023 by Dr. Lalla Brothers.  At that time ZEPHAN DROBNICK was doing well and staying active with no recurrent episodes of AF. The patient is now pending procedure as outlined above. Since his last visit, he has been doing well with no cardiac complaints.  He denies chest pain, shortness of breath, lower extremity edema, fatigue, palpitations, melena, hematuria, hemoptysis, diaphoresis, weakness, presyncope, syncope, orthopnea, and PND.     Past Medical History    Past Medical History:  Diagnosis Date   Ascending aortic aneurysm (HCC)    Atrial fibrillation [facility])    Past Surgical History:  Procedure Laterality Date   ATRIAL  FIBRILLATION ABLATION N/A 11/17/2022   Procedure: ATRIAL FIBRILLATION ABLATION;  Surgeon: Lanier Prude, MD;  [facility] INVASIVE CV LAB;  Service: Cardiovascular;  Laterality: N/A;   CARDIOVERSION N/A 10/01/2019   Procedure: CARDIOVERSION;  Surgeon: Elease Hashimoto Deloris Ping, MD;  Location: [facility] ENDOSCOPY;  Service: Cardiovascular;  Laterality: N/A;   CARDIOVERSION N/A 12/16/2019   Procedure: CARDIOVERSION;  Surgeon: [name], MD;  Location: [facility] ENDOSCOPY;  Service: Cardiovascular;  Laterality: N/A;   CARDIOVERSION N/A 10/27/2022   Procedure: CARDIOVERSION;  Surgeon: Jodelle Red, MD;  Location: [facility] ENDOSCOPY;  Service: Cardiovascular;  Laterality: N/A;   CARDIOVERSION N/A 01/13/2023   Procedure: CARDIOVERSION;  Surgeon: Thomasene Ripple, DO;  Location: [facility] ENDOSCOPY;  Service: Cardiovascular;  Laterality: N/A;   CATARACT EXTRACTION, BILATERAL     EYE SURGERY  07/30/2019    Allergies  Allergies  Allergen Reactions   Malarone [Atovaquone-Proguanil Hcl]     Upset stomach, stomach pain   Quinolones     Aortic aneurysm    Home Medications    Prior to Admission medications   Medication Sig Start Date End Date Taking? Authorizing Provider  apixaban (ELIQUIS) 5 MG TABS tablet Take 1 tablet (5 mg total) by mouth 2 (two) times daily. 08/17/22   Wendall Stade, MD  diltiazem (CARDIZEM) 30 MG tablet TAKE 1 TABLET BY MOUTH EVERY 4 HOURS AS NEEDED FOR AFIB HEART RATE >100 AS LONG AS TOP BP >100. 02/06/23   Newman Nip, NP  metoprolol succinate (TOPROL-XL) 50 MG 24 hr tablet Take 1 tablet (50 mg total) by mouth daily. Take with or  immediately following a meal. 10/14/22   Wendall Stade, MD  zolpidem (AMBIEN) 10 MG tablet Take 1 tablet (10 mg total) by mouth at bedtime as needed. for sleep 02/08/23   Kristian Covey, MD    Physical Exam    Vital Signs:  VIHAANREDDY FEDEROWICZ does not have vital signs available for review today.  Given telephonic nature of communication, physical exam  is limited. AAOx3. NAD. Normal affect.  Speech and respirations are unlabored.  Accessory Clinical Findings    None  Assessment & Plan    1.  Preoperative Cardiovascular Risk Assessment:  -Patient is RCRI score 0.4%  The patient affirms he has been doing well without any new cardiac symptoms. They are able to achieve 6 METS without cardiac limitations. Therefore, based on ACC/AHA guidelines, the patient would be at acceptable risk for the planned procedure without further cardiovascular testing. The patient was advised that if he develops new symptoms prior to surgery to contact our office to arrange for a follow-up visit, and he verbalized understanding.   The patient was advised that if he develops new symptoms prior to surgery to contact our office to arrange for a follow-up visit, and he verbalized understanding.  Per office protocol, patient can hold Eliquis  for 1 days prior to colonoscopy     A copy of this note will be routed to requesting surgeon.  Time:   Today, I have spent 8 minutes with the patient with telehealth technology discussing medical history, symptoms, and management plan.     Napoleon Form, Leodis Rains, NP  06/26/2023, 7:44 AM

## 2023-06-27 ENCOUNTER — Ambulatory Visit
Admission: RE | Admit: 2023-06-27 | Discharge: 2023-06-27 | Disposition: A | Discharge status: Home / Self Care | Payer: Medicare Other | Source: Ambulatory Visit | Attending: Radiation Oncology | Admitting: Radiation Oncology

## 2023-06-27 ENCOUNTER — Ambulatory Visit: Admission: RE | Admit: 2023-06-27 | Payer: Medicare Other | Source: Ambulatory Visit | Admitting: Radiation Oncology

## 2023-06-27 ENCOUNTER — Encounter: Payer: Self-pay | Admitting: Radiation Oncology

## 2023-06-27 VITALS — BP 147/93 | HR 87 | Temp 97.3°F | Resp 18 | Ht 73.0 in | Wt 198.4 lb

## 2023-06-27 DIAGNOSIS — C61 Malignant neoplasm of prostate: Secondary | ICD-10-CM | POA: Insufficient documentation

## 2023-06-27 DIAGNOSIS — Z800 Family history of malignant neoplasm of digestive organs: Secondary | ICD-10-CM | POA: Diagnosis not present

## 2023-06-27 DIAGNOSIS — Z79899 Other long term (current) drug therapy: Secondary | ICD-10-CM | POA: Insufficient documentation

## 2023-06-27 DIAGNOSIS — Z7901 Long term (current) use of anticoagulants: Secondary | ICD-10-CM | POA: Insufficient documentation

## 2023-06-27 DIAGNOSIS — I4891 Unspecified atrial fibrillation: Secondary | ICD-10-CM | POA: Insufficient documentation

## 2023-06-27 DIAGNOSIS — I714 Abdominal aortic aneurysm, without rupture, unspecified: Secondary | ICD-10-CM | POA: Insufficient documentation

## 2023-06-27 HISTORY — DX: Malignant neoplasm of prostate: C61

## 2023-06-27 NOTE — Progress Notes (Signed)
Radiation Oncology         [phone] ________________________________  Initial Outpatient Consultation  Name: Sean [name] MRN: [id number]  Date: 06/27/2023  DOB: 1956/07/20  MV:[id number], [name], MD  Shelly Rubenstein*   REFERRING PHYSICIAN: Shelly Rubenstein*  DIAGNOSIS: 67 y.o. gentleman with Stage T1c adenocarcinoma of the prostate with Gleason score of 3+4, and PSA of 4.97.    ICD-10-CM   1. Malignant neoplasm of prostate (HCC)  C61       HISTORY OF PRESENT ILLNESS: Sean [name] is a 67 y.o. male with a diagnosis of prostate cancer. He has had a rising PSA for the past several years and was noted to have an elevated PSA of 4.97 in 01/2023, on routine labs by his primary care physician, Dr. Caryl Never. Accordingly, he was referred for evaluation in urology by Dr. Liliane Shi on 03/30/23. He underwent prostate MRI on 05/17/23 showing a tiny PI-RADS 3 lesion in the left posterolateral peripheral zone of the mid gland. The patient proceeded to MRI fusion transrectal ultrasound biopsy of the prostate on 05/25/23.  The prostate volume measured 44 cc.  Out of 15 core biopsies, 5 were positive.  The maximum Gleason score was 3+4, and this was seen in all three samples from the MRI ROI as well as in the left mid lateral, and left apex lateral.  The patient reviewed the biopsy results with his urologist and he has kindly been referred today for discussion of potential radiation treatment options. He is scheduled for his routine 5 year screening colonoscopy in August 2024.   PREVIOUS RADIATION THERAPY: No  PAST MEDICAL HISTORY:  Past Medical History:  Diagnosis Date   Ascending aortic aneurysm (HCC)    Atrial fibrillation (HCC)       PAST SURGICAL HISTORY: Past Surgical History:  Procedure Laterality Date   ATRIAL FIBRILLATION ABLATION N/A 11/17/2022   Procedure: ATRIAL FIBRILLATION ABLATION;  Surgeon: Lanier Prude, MD;  [facility] INVASIVE CV LAB;  Service:  Cardiovascular;  Laterality: N/A;   CARDIOVERSION N/A 10/01/2019   Procedure: CARDIOVERSION;  Surgeon: Elease Hashimoto Deloris Ping, MD;  Location: [facility] ENDOSCOPY;  Service: Cardiovascular;  Laterality: N/A;   CARDIOVERSION N/A 12/16/2019   Procedure: CARDIOVERSION;  Surgeon: [name], MD;  Location: [facility] ENDOSCOPY;  Service: Cardiovascular;  Laterality: N/A;   CARDIOVERSION N/A 10/27/2022   Procedure: CARDIOVERSION;  Surgeon: Jodelle Red, MD;  Location: [facility] ENDOSCOPY;  Service: Cardiovascular;  Laterality: N/A;   CARDIOVERSION N/A 01/13/2023   Procedure: CARDIOVERSION;  Surgeon: Thomasene Ripple, DO;  Location: [facility] ENDOSCOPY;  Service: Cardiovascular;  Laterality: N/A;   CATARACT EXTRACTION, BILATERAL     EYE SURGERY  07/30/2019   PROSTATE BIOPSY      FAMILY HISTORY:  Family History  Problem Relation Age of Onset   Colon cancer Mother    Cancer Brother 80       testicular cancer    SOCIAL HISTORY: He lives at home with his wife in [place] and works as an Set designer. Social History   Socioeconomic History   Marital status: Married    Spouse name: Not on file   Number of children: Not on file   Years of education: Not on file   Highest education level: Not on file  Occupational History   Not on file  Tobacco Use   Smoking status: Some Days    Types: Cigars   Smokeless tobacco: Never  Vaping Use   Vaping status: Never Used  Substance and  Sexual Activity   Alcohol use: Yes    Alcohol/week: 4.0 - 5.0 standard drinks of alcohol    Types: 4 - 5 Cans of beer per week    Comment: occ   Drug use: No   Sexual activity: Not on file  Other Topics Concern   Not on file  Social History Narrative   Lives in [place] with wife and kids.   He is an Pensions consultant.      Social Determinants of Health   Financial Resource Strain: Not on file  Food Insecurity: No Food Insecurity (06/27/2023)   Hunger Vital Sign    Worried About Running Out of Food in the Last Year: Never true     Ran Out of Food in the Last Year: Never true  Transportation Needs: No Transportation Needs (06/27/2023)   PRAPARE - Administrator, Civil Service (Medical): No    Lack of Transportation (Non-Medical): No  Physical Activity: Not on file  Stress: Not on file  Social Connections: Not on file  Intimate Partner Violence: Not At Risk (06/27/2023)   Humiliation, Afraid, Rape, and Kick questionnaire    Fear of Current or Ex-Partner: No    Emotionally Abused: No    Physically Abused: No    Sexually Abused: No    ALLERGIES: Malarone [atovaquone-proguanil hcl] and Quinolones  MEDICATIONS:  Current Outpatient Medications  Medication Sig Dispense Refill   apixaban (ELIQUIS) 5 MG TABS tablet Take 1 tablet (5 mg total) by mouth 2 (two) times daily. 180 tablet 2   diltiazem (CARDIZEM) 30 MG tablet TAKE 1 TABLET BY MOUTH EVERY 4 HOURS AS NEEDED FOR AFIB HEART RATE >100 AS LONG AS TOP BP >100. 90 tablet 1   metoprolol succinate (TOPROL-XL) 50 MG 24 hr tablet Take 1 tablet (50 mg total) by mouth daily. Take with or immediately following a meal. 90 tablet 3   zolpidem (AMBIEN) 10 MG tablet Take 1 tablet (10 mg total) by mouth at bedtime as needed. for sleep 30 tablet 1   No current facility-administered medications for this encounter.    REVIEW OF SYSTEMS:  On review of systems, the patient reports that he is doing well overall. He denies any chest pain, shortness of breath, cough, fevers, chills, night sweats. He notes a weight loss of 6 pounds. He denies any bowel disturbances, and denies abdominal pain, nausea or vomiting. He denies any new musculoskeletal or joint aches or pains. His IPSS was 5, indicating mild urinary symptoms. His SHIM was 25, indicating he does not have erectile dysfunction. A complete review of systems is obtained and is otherwise negative.    PHYSICAL EXAM:  Wt Readings from Last 3 Encounters:  06/27/23 198 lb 6 oz (90 kg)  02/15/23 195 lb (88.5 kg)  02/08/23  192 lb 12.8 oz (87.5 kg)   Temp Readings from Last 3 Encounters:  06/27/23 (!) 97.3 F (36.3 C) (Temporal)  02/08/23 97.7 F (36.5 C) (Oral)  01/13/23 (!) 96.4 F (35.8 C) (Temporal)   BP Readings from Last 3 Encounters:  06/27/23 (!) 147/93  02/15/23 118/70  02/08/23 104/60   Pulse Readings from Last 3 Encounters:  06/27/23 87  02/15/23 (!) 53  02/08/23 (!) 55   Pain Assessment Pain Score: 0-No pain/10  In general this is a well appearing Caucasian man in no acute distress. He's alert and oriented x4 and appropriate throughout the examination. Cardiopulmonary assessment is negative for acute distress, and he exhibits normal effort.  KPS = 100  100 - Normal; no complaints; no evidence of disease. 90   - Able to carry on normal activity; minor signs or symptoms of disease. 80   - Normal activity with effort; some signs or symptoms of disease. 30   - Cares for self; unable to carry on normal activity or to do active work. 60   - Requires occasional assistance, but is able to care for most of his personal needs. 50   - Requires considerable assistance and frequent medical care. 40   - Disabled; requires special care and assistance. 30   - Severely disabled; hospital admission is indicated although death not imminent. 20   - Very sick; hospital admission necessary; active supportive treatment necessary. 10   - Moribund; fatal processes progressing rapidly. 0     - Dead  Karnofsky DA, Abelmann WH, Craver LS and Burchenal [facility] [phone]) The use of the nitrogen mustards in the palliative treatment of carcinoma: with particular reference to bronchogenic carcinoma Cancer 1 634-56  LABORATORY DATA:  Lab Results  Component Value Date   WBC 5.5 01/11/2023   HGB 14.3 01/11/2023   HCT 42.8 01/11/2023   MCV 96.8 01/11/2023   PLT 191 01/11/2023   Lab Results  Component Value Date   NA 134 (L) 01/11/2023   K 3.8 01/11/2023   CL 103 01/11/2023   CO2 25 01/11/2023   Lab Results   Component Value Date   ALT 28 02/17/2023   AST 25 02/17/2023   ALKPHOS 57 02/17/2023   BILITOT 0.8 02/17/2023     RADIOGRAPHY: No results found.    IMPRESSION/PLAN: 1. 67 y.o. gentleman with Stage T1c adenocarcinoma of the prostate with Gleason Score of 3+4, and PSA of 5. We discussed the patient's workup and outlined the nature of prostate cancer in this setting. The patient's T stage, Gleason's score, and PSA put him into the favorable intermediate risk group. Accordingly, he is eligible for a variety of potential treatment options including brachytherapy, 5.5 weeks of external radiation, or prostatectomy. We discussed the available radiation techniques, and focused on the details and logistics of delivery. We discussed and outlined the risks, benefits, short and long-term effects associated with radiotherapy and compared and contrasted these with prostatectomy. We discussed the role of SpaceOAR gel in reducing the rectal toxicity associated with radiotherapy. He appears to have a good understanding of his disease and our treatment recommendations which are of curative intent.  He was encouraged to ask questions that were answered to his stated satisfaction.  At the conclusion of our conversation, the patient is interested in moving forward with his consult visit with Dr. Laverle Patter on 07/04/23 so that he is fully informed about all of his treatment options prior to making a treatment decision. We will share our discussion with Dr. Laverle Patter and Dr. Liliane Shi and he has our contact information to let us know once he reaches a decision so that the appropriate treatment planning can take place. He does have a 2 week vacation to [place] coming up in September 2024 so he would like to postpone any treatment until his return which we feel is certainly reasonable and safe. We enjoyed meeting him today and look forward to continuing to participate in his care. He knows that he is welcome to call at any time in the  interim with any questions or concerns regarding the treatment options discussed today.  We personally spent 70 minutes in this encounter including chart review, reviewing radiological studies,  meeting face-to-face with the patient, entering orders and completing documentation.    Marguarite Arbour, PA-C    Margaretmary Dys, MD  [facility]  Radiation Oncology Direct Dial: [phone]  Fax: [phone] [email or web address]  Skype  LinkedIn   This document serves as a record of services personally performed by Margaretmary Dys, MD and Marcello Fennel, PA-C. It was created on their behalf by Mickie Bail, a trained medical scribe. The creation of this record is based on the scribe's personal observations and the provider's statements to them. This document has been checked and approved by the attending provider.

## 2023-06-27 NOTE — Progress Notes (Signed)
Introduced myself to the patient as the prostate nurse navigator.  No barriers to care identified at this time.  He is here to discuss his radiation treatment options and does have a surgical consult on 8/6 with Dr. Laverle Patter.  I gave him my business card and asked him to call me with questions or concerns.  Verbalized understanding.

## 2023-06-29 ENCOUNTER — Other Ambulatory Visit: Payer: Self-pay | Admitting: Cardiovascular Disease

## 2023-06-29 DIAGNOSIS — I480 Paroxysmal atrial fibrillation: Secondary | ICD-10-CM

## 2023-06-30 NOTE — Telephone Encounter (Signed)
Prescription refill request for Eliquis received. Indication:afib Last office visit:3/24 Scr:0.97  2/24 Age: 67 Weight:90 kg  Prescription refilled

## 2023-07-06 NOTE — Progress Notes (Signed)
Patient was recent Rad Onc Consult on 7/30 for his stage T1c adenocarcinoma of the prostate with Gleason Score of 3+4, and PSA of 5.   Patient proceed with surgical consult on 8/6 and has confirmed he would like to proceed with brachytherapy.    MD's notified, plan of care in progress.

## 2023-07-21 ENCOUNTER — Telehealth: Payer: Self-pay

## 2023-07-21 NOTE — Telephone Encounter (Signed)
Patient with diagnosis of atrial fibrillation on Eliquis for anticoagulation.    Procedure:   Radioactive Seed Implant/Brachytherapy Implant   Date of Surgery:  Clearance 11/03/23       CHA2DS2-VASc Score = 2   This indicates a 2.2% annual risk of stroke. The patient's score is based upon: CHF History: 1 HTN History: 0 Diabetes History: 0 Stroke History: 0 Vascular Disease History: 0 Age Score: 1 Gender Score: 0   CrCl 94 Platelet count 191  Our protocol will allow for 3 day hold of Eliquis prior to procedure.  If urology requesting 5 day hold, will need to get approval from cardiologist.    Patient will not need bridging with Lovenox (enoxaparin) around procedure.  **This guidance is not considered finalized until pre-operative APP has relayed final recommendations.**

## 2023-07-21 NOTE — Telephone Encounter (Signed)
Pharmacy please advise on holding Eliquis 5 days prior to radioactive seed implant and brachytherapy scheduled for 11/03/2023. Thank you.

## 2023-07-21 NOTE — Telephone Encounter (Signed)
   Pre-operative Risk Assessment    Patient Name: Sean [name]  DOB: 1956-01-31 MRN: [id number]    Last OV was 02/15/23 with Dr. Lalla Brothers  Pt had tele preop for colonsocopy 06/26/23.  Request for Surgical Clearance    Procedure:   Radioactive Seed Implant/Brachytherapy Implant  Date of Surgery:  Clearance 11/03/23                                 Surgeon:  Dr. Rhoderick Moody Surgeon's Group or Practice Name:  [facility] Phone number:  [phone] Fax number:  [phone]   Type of Clearance Requested:   - Medical  - Pharmacy:  Hold Apixaban (Eliquis) requesting office is asking for 5 day hold prior   Type of Anesthesia:  Not Indicated   Additional requests/questions:    Wynetta Fines   07/21/2023, 3:46 PM

## 2023-07-25 ENCOUNTER — Encounter: Payer: Self-pay | Admitting: Thoracic Surgery (Cardiothoracic Vascular Surgery)

## 2023-07-25 ENCOUNTER — Ambulatory Visit (INDEPENDENT_AMBULATORY_CARE_PROVIDER_SITE_OTHER): Payer: Medicare Other | Admitting: Thoracic Surgery (Cardiothoracic Vascular Surgery)

## 2023-07-25 ENCOUNTER — Ambulatory Visit
Admission: RE | Admit: 2023-07-25 | Discharge: 2023-07-25 | Disposition: A | Discharge status: Home / Self Care | Payer: Medicare Other | Source: Ambulatory Visit | Attending: Thoracic Surgery (Cardiothoracic Vascular Surgery) | Admitting: Thoracic Surgery (Cardiothoracic Vascular Surgery)

## 2023-07-25 VITALS — BP 145/90 | HR 100 | Resp 20 | Ht 73.0 in | Wt 198.0 lb

## 2023-07-25 DIAGNOSIS — I7121 Aneurysm of the ascending aorta, without rupture: Secondary | ICD-10-CM | POA: Diagnosis not present

## 2023-07-25 MED ORDER — DILTIAZEM HCL ER COATED BEADS 120 MG PO CP24: 120.0000 mg | ORAL_CAPSULE | Freq: Every day | ORAL | 6 refills | Status: DC

## 2023-07-25 MED ORDER — IOPAMIDOL (ISOVUE-370) INJECTION 76%
500.0000 mL | Freq: Once | INTRAVENOUS | Status: AC | PRN
  Administered 2023-07-25: 75 mL via INTRAVENOUS

## 2023-07-25 NOTE — Progress Notes (Signed)
      [address]       [place] [address]             [phone]     HPI: Sean [name] returns for a scheduled follow-up visit regarding his ascending aneurysm.  Sean [name] is a 67 year old gentleman with a history of an ascending aneurysm, atrial fibrillation, and melanoma.  First noted to have a dilated aortic root on echo.  CT angiogram in 2022 showed a 4.6 cm ascending aneurysm.  I last saw him in February 2024.  In the interim since his last visit he has been feeling well.  He has noticed his blood pressure is running in the 130s to 140s most of the time.  He is currently on Toprol-XL 50 mg daily and takes 30 mg of diltiazem when his blood pressure is elevated.  No chest pain, pressure, or tightness.  Past Medical History:  Diagnosis Date   Ascending aortic aneurysm [facility])    Atrial fibrillation (HCC)    Dyslipidemia 09/01/2008   Qualifier: Diagnosis of   By: Lovell Sheehan MD, Balinda Quails        History of melanoma 02/08/2023   Malignant neoplasm of prostate (HCC) 06/27/2023    Current Outpatient Medications  Medication Sig Dispense Refill   diltiazem (CARDIZEM CD) 120 MG 24 hr capsule Take 1 capsule (120 mg total) by mouth daily. 30 capsule 6   ELIQUIS 5 MG TABS tablet TAKE 1 TABLET BY MOUTH TWICE A DAY 180 tablet 2   metoprolol succinate (TOPROL-XL) 50 MG 24 hr tablet Take 1 tablet (50 mg total) by mouth daily. Take with or immediately following a meal. 90 tablet 3   zolpidem (AMBIEN) 10 MG tablet Take 1 tablet (10 mg total) by mouth at bedtime as needed. for sleep 30 tablet 1   No current facility-administered medications for this visit.    Physical Exam BP (!) 145/90   Pulse 100   Resp 20   Ht 6\' 1"  (1.854 m)   Wt 198 lb (89.8 kg)   SpO2 95% Comment: RA  BMI 26.23 kg/m  67 year old man in no acute distress Well-developed and well-nourished Alert and oriented x 3 with no focal deficits Lungs clear equal breath sounds bilaterally Cardiac regular rate  and rhythm with normal S1 and S2  Diagnostic Tests: Official CT scan reading is not yet available.  I reviewed the images.  There is a 4.5 cm ascending aneurysm.  No other pathology evident.  Await radiology interpretation.  Impression: Sean [name] is a 67 year old gentleman with a history of an ascending aneurysm, atrial fibrillation, melanoma, and recently diagnosed with early stage prostate cancer.    Ascending aneurysm-stable around 4.5 cm.  Needs continued semiannual follow-up.  Hypertension-blood pressure elevated at 145/90.  He checks himself at home and says he has been running in the 130s to 140s systolic consistently.  He has been taking diltiazem 30 mg as needed for blood pressure.  I recommended we go ahead and start him on diltiazem CD 120 mg p.o. daily in addition to his Toprol-XL.  Prostate cancer-recently diagnosed early-stage.  Plan: Return in 6 months with CT angio of chest  I spent 20 minutes in review of records, images, and in consultation with Sean [name] today. Loreli Slot, MD [facility] [phone]

## 2023-07-26 ENCOUNTER — Other Ambulatory Visit: Payer: Self-pay | Admitting: Cardiovascular Disease

## 2023-07-26 NOTE — Telephone Encounter (Signed)
Pre-op team,   Please contact requesting office to clarify Eliquis hold. Pharm D recommends holding Eliquis for 3 days prior to procedure. If they are requiring hold for 5 days, will need to obtain recommendations from cardiologist.   Thank you!  Etta Grandchild. Arika Mainer, DNP, NP-C  07/26/2023, 7:21 AM [facility] [address] Office [phone] Fax [phone]

## 2023-07-26 NOTE — Telephone Encounter (Signed)
I left a detailed message for surgery scheduler Shanda Bumps. Our office is needing to confirm if Dr. Liliane Shi will be acceptable of the recommendations from our pharm-D to hold Eliquis x 3 days as opposed to the 5 day hold being requested. I will fax these notes over as FYI to reply back to our office.   If needing to hold Eliquis x 5 days this will need to be addressed with the cardiologist as well.

## 2023-07-26 NOTE — Telephone Encounter (Addendum)
   Name: Sean [name]  DOB: [birth year]  MRN: [id number]   Primary Cardiologist: Charlton Haws, MD  Chart reviewed as part of pre-operative protocol coverage. Sean [name] was last assessed for a different procedure by Robin Searing, NP via telemedicine visit on 06/26/2023. Per telemedicine note: "The patient affirms he has been doing well without any new cardiac symptoms. They are able to achieve 6 METS without cardiac limitations. Therefore, based on ACC/AHA guidelines, the patient would be at acceptable risk for the planned procedure without further cardiovascular testing."  Therefore, based on ACC/AHA guidelines, the patient would be at acceptable risk for the planned procedure without further cardiovascular testing.   Per Pharm D, patient may hold Eliquis for 3 days prior to procedure. Patient will not need Lovenox bridging around procedure.      I will route this recommendation to the requesting party via Epic fax function and remove from pre-op pool. Please call with questions.  Carlos Levering, NP 07/26/2023, 3:24 PM

## 2023-07-26 NOTE — Telephone Encounter (Signed)
Jessica from [facility] left vm that pt only needs to hold Eliquis x 2-3 day. The 5 days was supposed to be for ASA. I called her back and left vm that we do not show the pt is on ASA, though I will update the requesting office recommendations from Pharm-D for Eliquis are acceptable to Dr. Liliane Shi.   I left message for Shanda Bumps that if the pt has said they are ASA we do not have that noted on the pt's medication chart.

## 2023-08-08 ENCOUNTER — Telehealth: Payer: Self-pay | Admitting: Family Medicine

## 2023-08-08 MED ORDER — ZOLPIDEM TARTRATE 10 MG PO TABS: 10.0000 mg | ORAL_TABLET | Freq: Every evening | ORAL | 1 refills | Status: DC | PRN

## 2023-08-08 NOTE — Telephone Encounter (Signed)
Pt is going to [place] on 9-24 and would Prescription Request  08/08/2023  LOV: 02/08/2023  What is the name of the medication or equipment? zolpidem (AMBIEN) 10 MG tablet   Have you contacted your pharmacy to request a refill? No   Which pharmacy would you like this sent to?  [id number] - [name], [place] - [address] [address] Phone: [phone] Fax: [phone]     Patient notified that their request is being sent to the clinical staff for review and that they should receive a response within 2 business days.   Please advise at Mobile [phone] (mobile)

## 2023-08-14 LAB — HM COLONOSCOPY

## 2023-08-29 ENCOUNTER — Other Ambulatory Visit: Payer: Self-pay | Admitting: Urology

## 2023-10-06 ENCOUNTER — Telehealth: Payer: Self-pay | Admitting: *Deleted

## 2023-10-06 NOTE — Telephone Encounter (Signed)
CALLED PATIENT TO INFORM OF NEW TIME FOR HIS PRE-SEED APPTS., LVM FOR A RETURN CALL

## 2023-10-09 ENCOUNTER — Telehealth: Payer: Self-pay | Admitting: *Deleted

## 2023-10-09 NOTE — Telephone Encounter (Signed)
RETURNED PATIENT'S PHONE CALL, SPOKE WITH PATIENT. ?

## 2023-10-11 NOTE — Progress Notes (Signed)
  Radiation Oncology         (336) [phone] ________________________________  Name: BRIANA WINGERT MRN: [id number]  Date: 10/12/2023  DOB: [birth year]  SIMULATION AND TREATMENT PLANNING NOTE PUBIC ARCH STUDY  [id number], [name], MD  Shelly Rubenstein*  DIAGNOSIS: 67 y.o. gentleman with Stage T1c adenocarcinoma of the prostate with Gleason Score of 3+4, and PSA of 5.   Oncology History  Malignant neoplasm of prostate (HCC)  05/25/2023 Cancer Staging   Staging form: Prostate, AJCC 8th Edition - Clinical stage from 05/25/2023: Stage IIB (cT1c, cN0, cM0, PSA: 5, Grade Group: 2) - Signed by Marcello Fennel, PA-C on 06/27/2023 Histopathologic type: Adenocarcinoma, NOS Stage prefix: Initial diagnosis Prostate specific antigen (PSA) range: Less than 10 Gleason primary pattern: 3 Gleason secondary pattern: 4 Gleason score: 7 Histologic grading system: 5 grade system Number of biopsy cores examined: 15 Number of biopsy cores positive: 5 Location of positive needle core biopsies: One side   06/27/2023 Initial Diagnosis   Malignant neoplasm of prostate (HCC)       ICD-10-CM   1. Malignant neoplasm of prostate (HCC)  C61       COMPLEX SIMULATION:  The patient presented today for evaluation for possible prostate seed implant. He was brought to the radiation planning suite and placed supine on the CT couch. A 3-dimensional image study set was obtained in upload to the planning computer. There, on each axial slice, I contoured the prostate gland. Then, using three-dimensional radiation planning tools I reconstructed the prostate in view of the structures from the transperineal needle pathway to assess for possible pubic arch interference. In doing so, I did not appreciate any pubic arch interference. Also, the patient's prostate volume was estimated based on the drawn structure. The volume was 45 cc.  Given the pubic arch appearance and prostate volume, patient remains a good candidate to  proceed with prostate seed implant. Today, he freely provided informed written consent to proceed.    PLAN: The patient will undergo prostate seed implant.   ________________________________  Artist Pais. Kathrynn Running, M.D.

## 2023-10-11 NOTE — Progress Notes (Signed)
Radiation Oncology         (336) [phone] ________________________________  Outpatient Follow up- Pre-seed visit  Name: Sean [name] MRN: [id number]  Date: 10/12/2023  DOB: 04/17/1956  [id number], [name], MD  Shelly Rubenstein*   REFERRING PHYSICIAN: Shelly Rubenstein*  DIAGNOSIS: 67 y.o. gentleman with Stage T1c adenocarcinoma of the prostate with Gleason score of 3+4, and PSA of 4.97.     ICD-10-CM   1. Malignant neoplasm of prostate (HCC)  C61       HISTORY OF PRESENT ILLNESS: Sean [name] is a 67 y.o. male with a diagnosis of prostate cancer. He has had a rising PSA for the past several years and was noted to have an elevated PSA of 4.97 in 01/2023, on routine labs by his primary care physician, Dr. Caryl Never. Accordingly, he was referred for evaluation in urology by Dr. Liliane Shi on 03/30/23. He underwent prostate MRI on 05/17/23 showing a tiny PI-RADS 3 lesion in the left posterolateral peripheral zone of the mid gland. The patient proceeded to MRI fusion transrectal ultrasound biopsy of the prostate on 05/25/23.  The prostate volume measured 44 cc.  Out of 15 core biopsies, 5 were positive.  The maximum Gleason score was 3+4, and this was seen in all three samples from the MRI ROI as well as in the left mid lateral, and left apex lateral.   The patient reviewed the biopsy results with his urologist and was kindly referred to [place] for discussion of potential radiation treatment options. We initially met the patient on 06/27/23 and he was most interested in proceeding with brachytherapy and SpaceOAR gel placement for treatment of his disease but wanted to postpone treatment until after his planned trip to [place] in late 07/2023. He is here today for his pre-procedure imaging for planning and to answer any additional questions he may have about this treatment.   PREVIOUS RADIATION THERAPY: No  PAST MEDICAL HISTORY:  Past Medical History:  Diagnosis Date   Ascending aortic  aneurysm (HCC)    Atrial fibrillation (HCC)    Dyslipidemia 09/01/2008   Qualifier: Diagnosis of   By: Lovell Sheehan MD, John E        History of melanoma 02/08/2023   Malignant neoplasm of prostate (HCC) 06/27/2023      PAST SURGICAL HISTORY: Past Surgical History:  Procedure Laterality Date   ATRIAL FIBRILLATION ABLATION N/A 11/17/2022   Procedure: ATRIAL FIBRILLATION ABLATION;  Surgeon: Lanier Prude, MD;  Location: [facility] INVASIVE CV LAB;  Service: Cardiovascular;  Laterality: N/A;   CARDIOVERSION N/A 10/01/2019   Procedure: CARDIOVERSION;  Surgeon: Elease Hashimoto Deloris Ping, MD;  Location: [facility] ENDOSCOPY;  Service: Cardiovascular;  Laterality: N/A;   CARDIOVERSION N/A 12/16/2019   Procedure: CARDIOVERSION;  Surgeon: [name], MD;  Location: [facility] ENDOSCOPY;  Service: Cardiovascular;  Laterality: N/A;   CARDIOVERSION N/A 10/27/2022   Procedure: CARDIOVERSION;  Surgeon: Jodelle Red, MD;  Location: [facility] ENDOSCOPY;  Service: Cardiovascular;  Laterality: N/A;   CARDIOVERSION N/A 01/13/2023   Procedure: CARDIOVERSION;  Surgeon: Thomasene Ripple, DO;  Location: [facility] ENDOSCOPY;  Service: Cardiovascular;  Laterality: N/A;   CATARACT EXTRACTION, BILATERAL     EYE SURGERY  07/30/2019   PROSTATE BIOPSY      FAMILY HISTORY:  Family History  Problem Relation Age of Onset   Colon cancer Mother    Cancer Brother 35       testicular cancer    SOCIAL HISTORY:  Social History   Socioeconomic History   Marital  status: Married    Spouse name: Not on file   Number of children: Not on file   Years of education: Not on file   Highest education level: Not on file  Occupational History   Not on file  Tobacco Use   Smoking status: Some Days    Types: Cigars   Smokeless tobacco: Never  Vaping Use   Vaping status: Never Used  Substance and Sexual Activity   Alcohol use: Yes    Alcohol/week: 4.0 - 5.0 standard drinks of alcohol    Types: 4 - 5 Cans of beer per week    Comment: occ   Drug  use: No   Sexual activity: Not on file  Other Topics Concern   Not on file  Social History Narrative   Lives in [place] with wife and kids.   He is an Pensions consultant.      Social Determinants of Health   Financial Resource Strain: Not on file  Food Insecurity: No Food Insecurity (06/27/2023)   Hunger Vital Sign    Worried About Running Out of Food in the Last Year: Never true    Ran Out of Food in the Last Year: Never true  Transportation Needs: No Transportation Needs (06/27/2023)   PRAPARE - Administrator, Civil Service (Medical): No    Lack of Transportation (Non-Medical): No  Physical Activity: Not on file  Stress: Not on file  Social Connections: Not on file  Intimate Partner Violence: Not At Risk (06/27/2023)   Humiliation, Afraid, Rape, and Kick questionnaire    Fear of Current or Ex-Partner: No    Emotionally Abused: No    Physically Abused: No    Sexually Abused: No    ALLERGIES: Malarone [atovaquone-proguanil hcl] and Quinolones  MEDICATIONS:  Current Outpatient Medications  Medication Sig Dispense Refill   diltiazem (CARDIZEM CD) 120 MG 24 hr capsule Take 1 capsule (120 mg total) by mouth daily. 30 capsule 6   ELIQUIS 5 MG TABS tablet TAKE 1 TABLET BY MOUTH TWICE A DAY 180 tablet 2   metoprolol succinate (TOPROL-XL) 50 MG 24 hr tablet TAKE 1 TABLET BY MOUTH DAILY. TAKE WITH OR IMMEDIATELY FOLLOWING A MEAL. 90 tablet 0   zolpidem (AMBIEN) 10 MG tablet Take 1 tablet (10 mg total) by mouth at bedtime as needed. for sleep 30 tablet 1   No current facility-administered medications for this visit.    REVIEW OF SYSTEMS:  On review of systems, the patient reports that he is doing well overall. He denies any chest pain, shortness of breath, cough, fevers, chills, night sweats. He notes a weight loss of 6 pounds. He denies any bowel disturbances, and denies abdominal pain, nausea or vomiting. He denies any new musculoskeletal or joint aches or pains. His IPSS was 5,  indicating mild urinary symptoms. His SHIM was 25, indicating he does not have erectile dysfunction. A complete review of systems is obtained and is otherwise negative.     PHYSICAL EXAM:  Wt Readings from Last 3 Encounters:  07/25/23 198 lb (89.8 kg)  06/27/23 198 lb 6 oz (90 kg)  02/15/23 195 lb (88.5 kg)   Temp Readings from Last 3 Encounters:  06/27/23 (!) 97.3 F (36.3 C) (Temporal)  02/08/23 97.7 F (36.5 C) (Oral)  01/13/23 (!) 96.4 F (35.8 C) (Temporal)   BP Readings from Last 3 Encounters:  07/25/23 (!) 145/90  06/27/23 (!) 147/93  02/15/23 118/70   Pulse Readings from Last 3 Encounters:  07/25/23  100  06/27/23 87  02/15/23 (!) 53    /10  In general this is a well appearing Caucasian male in no acute distress. He's alert and oriented x4 and appropriate throughout the examination. Cardiopulmonary assessment is negative for acute distress, and he exhibits normal effort.     KPS = 100  100 - Normal; no complaints; no evidence of disease. 90   - Able to carry on normal activity; minor signs or symptoms of disease. 80   - Normal activity with effort; some signs or symptoms of disease. 22   - Cares for self; unable to carry on normal activity or to do active work. 60   - Requires occasional assistance, but is able to care for most of his personal needs. 50   - Requires considerable assistance and frequent medical care. 40   - Disabled; requires special care and assistance. 30   - Severely disabled; hospital admission is indicated although death not imminent. 20   - Very sick; hospital admission necessary; active supportive treatment necessary. 10   - Moribund; fatal processes progressing rapidly. 0     - Dead  Karnofsky DA, Abelmann WH, Craver LS and Burchenal [facility] [phone]) The use of the nitrogen mustards in the palliative treatment of carcinoma: with particular reference to bronchogenic carcinoma Cancer 1 634-56  LABORATORY DATA:  Lab Results  Component Value Date    WBC 5.5 01/11/2023   HGB 14.3 01/11/2023   HCT 42.8 01/11/2023   MCV 96.8 01/11/2023   PLT 191 01/11/2023   Lab Results  Component Value Date   NA 134 (L) 01/11/2023   K 3.8 01/11/2023   CL 103 01/11/2023   CO2 25 01/11/2023   Lab Results  Component Value Date   ALT 28 02/17/2023   AST 25 02/17/2023   ALKPHOS 57 02/17/2023   BILITOT 0.8 02/17/2023     RADIOGRAPHY: No results found.    IMPRESSION/PLAN: 1. 67 y.o. gentleman with Stage T1c adenocarcinoma of the prostate with Gleason score of 3+4, and PSA of 4.97.  The patient has elected to proceed with seed implant for treatment of his disease. We reviewed the risks, benefits, short and long-term effects associated with brachytherapy and discussed the role of SpaceOAR in reducing the rectal toxicity associated with radiotherapy.  He appears to have a good understanding of his disease and our treatment recommendations which are of curative intent.  He was encouraged to ask questions that were answered to his stated satisfaction. He has freely signed written consent to proceed today in the office and a copy of this document will be placed in his medical record. His procedure is tentatively scheduled for 11/03/23 in collaboration with Dr. Liliane Shi and we will see him back for his post-procedure visit approximately 3 weeks thereafter. We look forward to continuing to participate in his care. He knows that he is welcome to call with any questions or concerns at any time in the interim.  I personally spent 30 minutes in this encounter including chart review, reviewing radiological studies, meeting face-to-face with the patient, entering orders and completing documentation.    Marguarite Arbour, MMS, PA-C [facility] Radiation Oncology Physician Assistant Direct Dial: [phone]  Fax: [phone]

## 2023-10-12 ENCOUNTER — Ambulatory Visit
Admission: RE | Admit: 2023-10-12 | Discharge: 2023-10-12 | Disposition: A | Discharge status: Home / Self Care | Payer: Medicare Other | Source: Ambulatory Visit | Attending: Urology | Admitting: Urology

## 2023-10-12 ENCOUNTER — Ambulatory Visit
Admission: RE | Admit: 2023-10-12 | Discharge: 2023-10-12 | Disposition: A | Discharge status: Home / Self Care | Payer: Medicare Other | Source: Ambulatory Visit | Attending: Radiation Oncology | Admitting: Radiation Oncology

## 2023-10-12 ENCOUNTER — Encounter: Payer: Self-pay | Admitting: Urology

## 2023-10-12 VITALS — Ht 73.0 in | Wt 200.0 lb

## 2023-10-12 DIAGNOSIS — C61 Malignant neoplasm of prostate: Secondary | ICD-10-CM

## 2023-10-12 NOTE — Progress Notes (Signed)
Pre-seed nursing interview for a diagnosis of Stage T1c adenocarcinoma of the prostate with Gleason score of 3+4, and PSA of 4.97.  Patient identity verified x2.   Patient reports doing well. Denies any other related issues at this time.  Meaningful use complete.  Urinary Management medication(s)- None. Urology appointment date- Pending, with Dr. Sande Brothers at [facility]  Ht 6\' 1"  (1.854 m)   Wt 200 lb (90.7 kg)   BMI 26.39 kg/m   This concludes the interaction.  Sean Favors, LPN

## 2023-10-19 NOTE — Progress Notes (Signed)
On 10-19-2023 Reviewing patient chart for pre-op interview for surgery on 11-03-2023 @ [facility] by Dr Liliane Shi.  Patient last echo in epic 10-14-2022 with ef 30-35%.  Per anesthesia guidelines for ambulatory surgery center patient not candidate since ef is below 40%. Called and left message for OR scheduler for Dr Darrold Junker, informed her patient would need to be moved to main OR due to ef 30-35%.

## 2023-10-30 NOTE — Progress Notes (Signed)
Spoke to patient and advised him that per cardiology he can hold Eliquis 3 days before surgery with last dose being 10-30-23.  Patient stated understanding.

## 2023-10-31 ENCOUNTER — Telehealth: Payer: Self-pay | Admitting: *Deleted

## 2023-10-31 NOTE — Telephone Encounter (Signed)
Returned patient's phone call, lvm for a return call 

## 2023-11-01 NOTE — Patient Instructions (Signed)
SURGICAL WAITING ROOM VISITATION  Patients having surgery or a procedure may have no more than 2 support people in the waiting area - these visitors may rotate.    Children under the age of 52 must have an adult with them who is not the patient.  Due to an increase in RSV and influenza rates and associated hospitalizations, children ages 27 and under may not visit patients in [facility] hospitals.  If the patient needs to stay at the hospital during part of their recovery, the visitor guidelines for inpatient rooms apply. Pre-op nurse will coordinate an appropriate time for 1 support person to accompany patient in pre-op.  This support person may not rotate.    Please refer to the [facility] website for the visitor guidelines for Inpatients (after your surgery is over and you are in a regular room).    Your procedure is scheduled on: 11/03/23   Report to [facility] Main Entrance    Report to admitting at 11:00 AM   Call this number if you have problems the morning of surgery [phone]   Do not eat food :After Midnight.   After Midnight you may have the following liquids until ______ AM/ PM DAY OF SURGERY  Water Non-Citrus Juices (without pulp, NO RED-Apple, White grape, White cranberry) Black Coffee (NO MILK/CREAM OR CREAMERS, sugar ok)  Clear Tea (NO MILK/CREAM OR CREAMERS, sugar ok) regular and decaf                             Plain Jell-O (NO RED)                                           Fruit ices (not with fruit pulp, NO RED)                                     Popsicles (NO RED)                                                               Sports drinks like Gatorade (NO RED)                     If you have questions, please contact your surgeon's office.   FOLLOW BOWEL PREP AND ANY ADDITIONAL PRE OP INSTRUCTIONS YOU RECEIVED FROM YOUR SURGEON'S OFFICE!!!     Oral Hygiene is also important to reduce your risk of infection.                                     Remember - BRUSH YOUR TEETH THE MORNING OF SURGERY WITH YOUR REGULAR TOOTHPASTE  DENTURES WILL BE REMOVED PRIOR TO SURGERY PLEASE DO NOT APPLY "Poly grip" OR ADHESIVES!!!   Do NOT smoke after Midnight   Stop all vitamins and herbal supplements 7 days before surgery.   Take these medicines the morning of surgery with A SIP OF WATER: Diltiazem, Metoprolol   These are anesthesia recommendations  for holding your anticoagulants.  Please contact your prescribing physician to confirm IF it is safe to hold your anticoagulants for this length of time.   Eliquis Apixaban   72 hours   Xarelto Rivaroxaban   72 hours  Plavix Clopidogrel   120 hours  Pletal Cilostazol   120 hours    DO NOT TAKE ANY ORAL DIABETIC MEDICATIONS DAY OF YOUR SURGERY  Bring CPAP mask and tubing day of surgery.                              You may not have any metal on your body including jewelry, and body piercing             Do not wear lotions, powders, cologne, or deodorant              Men may shave face and neck.   Do not bring valuables to the hospital. [facility] IS NOT             RESPONSIBLE   FOR VALUABLES.   Contacts, glasses, dentures or bridgework may not be worn into surgery.  DO NOT BRING YOUR HOME MEDICATIONS TO THE HOSPITAL. PHARMACY WILL DISPENSE MEDICATIONS LISTED ON YOUR MEDICATION LIST TO YOU DURING YOUR ADMISSION IN THE HOSPITAL!    Patients discharged on the day of surgery will not be allowed to drive home.  Someone NEEDS to stay with you for the first 24 hours after anesthesia.   Special Instructions: Bring a copy of your healthcare power of attorney and living will documents the day of surgery if you haven't scanned them before.              Please read over the following fact sheets you were given: IF YOU HAVE QUESTIONS ABOUT YOUR PRE-OP INSTRUCTIONS PLEASE CALL [phone] Contras    If you received a COVID test during your pre-op visit  it is requested that you wear a mask when  out in public, stay away from anyone that may not be feeling well and notify your surgeon if you develop symptoms. If you test positive for Covid or have been in contact with anyone that has tested positive in the last 10 days please notify you surgeon.    [facility] - Preparing for Surgery Before surgery, you can play an important role.  Because skin is not sterile, your skin needs to be as free of germs as possible.  You can reduce the number of germs on your skin by washing with CHG (chlorahexidine gluconate) soap before surgery.  CHG is an antiseptic cleaner which kills germs and bonds with the skin to continue killing germs even after washing. Please DO NOT use if you have an allergy to CHG or antibacterial soaps.  If your skin becomes reddened/irritated stop using the CHG and inform your nurse when you arrive at Short Stay. Do not shave (including legs and underarms) for at least 48 hours prior to the first CHG shower.  You may shave your face/neck.  Please follow these instructions carefully:  1.  Shower with CHG Soap the night before surgery and the  morning of surgery.  2.  If you choose to wash your hair, wash your hair first as usual with your normal  shampoo.  3.  After you shampoo, rinse your hair and body thoroughly to remove the shampoo.  4.  Use CHG as you would any other liquid soap.  You can apply chg directly to the skin and wash.  Gently with a scrungie or clean washcloth.  5.  Apply the CHG Soap to your body ONLY FROM THE NECK DOWN.   Do   not use on face/ open                           Wound or open sores. Avoid contact with eyes, ears mouth and   genitals (private parts).                       Wash face,  Genitals (private parts) with your normal soap.             6.  Wash thoroughly, paying special attention to the area where your    surgery  will be performed.  7.  Thoroughly rinse your body with warm water from the neck down.  8.  DO NOT  shower/wash with your normal soap after using and rinsing off the CHG Soap.                9.  Pat yourself dry with a clean towel.            10.  Wear clean pajamas.            11.  Place clean sheets on your bed the night of your first shower and do not  sleep with pets. Day of Surgery : Do not apply any lotions/deodorants the morning of surgery.  Please wear clean clothes to the hospital/surgery center.  FAILURE TO FOLLOW THESE INSTRUCTIONS MAY RESULT IN THE CANCELLATION OF YOUR SURGERY  PATIENT SIGNATURE_________________________________  NURSE SIGNATURE__________________________________  ________________________________________________________________________

## 2023-11-01 NOTE — Progress Notes (Signed)
COVID Vaccine Completed: yes  Date of COVID positive in last 90 days:  PCP - Evelena Peat, MD Cardiologist - Charlton Haws, MD Electrophysiologist- Jettie Booze, MD  Cardiac clearance by Carlos Levering, NP 07/26/23 in Epic  CT- 07/25/23 Epic Chest x-ray -  EKG - 02/15/23 Epic Stress Test - 10/21/19 Epic ECHO - 10/14/22 Epic Cardiac Cath -  Pacemaker/ICD device last checked: Spinal Cord Stimulator:  Bowel Prep -   Sleep Study -  CPAP -   Fasting Blood Sugar -  Checks Blood Sugar _____ times a day  Last dose of GLP1 agonist-  N/A GLP1 instructions:  Hold 7 days before surgery    Last dose of SGLT-2 inhibitors-  N/A SGLT-2 instructions:  Hold 3 days before surgery    Blood Thinner Instructions:  Eliquis, hold 3 days Aspirin Instructions: Last Dose: 10/30/23  Activity level:  Can go up a flight of stairs and perform activities of daily living without stopping and without symptoms of chest pain or shortness of breath.  Able to exercise without symptoms  Unable to go up a flight of stairs without symptoms of     Anesthesia review: a fib, ascending aortic aneurysm  Patient denies shortness of breath, fever, cough and chest pain at PAT appointment  Patient verbalized understanding of instructions that were given to them at the PAT appointment. Patient was also instructed that they will need to review over the PAT instructions again at home before surgery.

## 2023-11-02 ENCOUNTER — Other Ambulatory Visit: Payer: Self-pay

## 2023-11-02 ENCOUNTER — Telehealth: Payer: Self-pay | Admitting: *Deleted

## 2023-11-02 ENCOUNTER — Encounter (HOSPITAL_COMMUNITY)
Admission: RE | Admit: 2023-11-02 | Discharge: 2023-11-02 | Disposition: A | Discharge status: Home / Self Care | Payer: Medicare Other | Source: Ambulatory Visit | Attending: Urology | Admitting: Urology

## 2023-11-02 VITALS — BP 122/92 | HR 85 | Temp 98.0°F | Resp 14 | Ht 73.0 in | Wt 198.0 lb

## 2023-11-02 DIAGNOSIS — I083 Combined rheumatic disorders of mitral, aortic and tricuspid valves: Secondary | ICD-10-CM | POA: Insufficient documentation

## 2023-11-02 DIAGNOSIS — I480 Paroxysmal atrial fibrillation: Secondary | ICD-10-CM | POA: Diagnosis not present

## 2023-11-02 DIAGNOSIS — C61 Malignant neoplasm of prostate: Secondary | ICD-10-CM | POA: Diagnosis not present

## 2023-11-02 DIAGNOSIS — Z01812 Encounter for preprocedural laboratory examination: Secondary | ICD-10-CM | POA: Diagnosis present

## 2023-11-02 DIAGNOSIS — I700 Atherosclerosis of aorta: Secondary | ICD-10-CM | POA: Diagnosis not present

## 2023-11-02 DIAGNOSIS — I509 Heart failure, unspecified: Secondary | ICD-10-CM | POA: Insufficient documentation

## 2023-11-02 DIAGNOSIS — Z79899 Other long term (current) drug therapy: Secondary | ICD-10-CM | POA: Insufficient documentation

## 2023-11-02 DIAGNOSIS — Z7901 Long term (current) use of anticoagulants: Secondary | ICD-10-CM | POA: Insufficient documentation

## 2023-11-02 DIAGNOSIS — F17200 Nicotine dependence, unspecified, uncomplicated: Secondary | ICD-10-CM | POA: Diagnosis not present

## 2023-11-02 LAB — BASIC METABOLIC PANEL
Anion gap: 8 (ref 5–15)
BUN: 18 mg/dL (ref 8–23)
CO2: 21 mmol/L — ABNORMAL LOW (ref 22–32)
Calcium: 9.2 mg/dL (ref 8.9–10.3)
Chloride: 107 mmol/L (ref 98–111)
Creatinine, Ser: 0.84 mg/dL (ref 0.61–1.24)
GFR, Estimated: >60 mL/min (ref >60)
Glucose, Bld: 109 mg/dL — ABNORMAL HIGH (ref 70–99)
Potassium: 4 mmol/L (ref 3.5–5.1)
Sodium: 136 mmol/L (ref 135–145)

## 2023-11-02 LAB — CBC
HCT: 43.4 % (ref 39.0–52.0)
Hemoglobin: 15.1 g/dL (ref 13.0–17.0)
MCH: 34 pg (ref 26.0–34.0)
MCHC: 34.8 g/dL (ref 30.0–36.0)
MCV: 97.7 fL (ref 80.0–100.0)
Platelets: 190 10*3/uL (ref 150–400)
RBC: 4.44 MIL/uL (ref 4.22–5.81)
RDW: 12.7 % (ref 11.5–15.5)
WBC: 7 10*3/uL (ref 4.0–10.5)
nRBC: 0 % (ref 0.0–0.2)

## 2023-11-02 NOTE — Progress Notes (Signed)
DISCUSSION: Sean [name] is a 67 yo male who presents to PAT prior to RADIOACTIVE SEED IMPLANT/BRACHYTHERAPY IMPLANT with Dr. Liliane Shi and Kathrynn Running. PMH of some day smoking, A.fib s/p ablation, CHF, TAA, prostate cancer  Patient is followed by Cardiology for hx of persistent A.fib on Eliquis. He is s/p multiple cardioversions but would go back in to A.fib. He had an echo in 09/2022 that showed a reduced EF so ablation was done on 11/17/2022. Then he developed A flutter and had another cardioversion on 01/13/23. Since that has maintained SR. He takes a BB and CCB prn. He was last seen in clinic on 02/15/23. He was cleared for upcoming surgery:  "Chart reviewed as part of pre-operative protocol coverage. Sean [name] was last assessed for a different procedure by Robin Searing, NP via telemedicine visit on 06/26/2023. Per telemedicine note: "The patient affirms he has been doing well without any new cardiac symptoms. They are able to achieve 6 METS without cardiac limitations. Therefore, based on ACC/AHA guidelines, the patient would be at acceptable risk for the planned procedure without further cardiovascular testing."   Therefore, based on ACC/AHA guidelines, the patient would be at acceptable risk for the planned procedure without further cardiovascular testing.    Per Pharm D, patient may hold Eliquis for 3 days prior to procedure. Patient will not need Lovenox bridging around procedure."   Patient follows with CT surgery for TAA measuring 4.5cm on CTA on 07/25/23 which is stable. Advised f/u in 6 months.  VS: BP (!) 122/92   Pulse 85   Temp 36.7 C (Oral)   Resp 14   Ht 6\' 1"  (1.854 m)   Wt 89.8 kg   SpO2 97%   BMI 26.12 kg/m   PROVIDERS: Kristian Covey, MD Cardiology: Charlton Haws, MD EP: Hillis Range, MD  LABS: Labs reviewed: Acceptable for surgery. (all labs ordered are listed, but only abnormal results are displayed)  Labs Reviewed  BASIC METABOLIC PANEL  CBC      IMAGES: CTA Chest 07/25/23:  IMPRESSION: Unchanged size and configuration of the ascending aorta, estimated 4.5 cm on the current CT. Aortic aneurysm NOS (ICD10-I71.9).   Aortic Atherosclerosis (ICD10-I70.0).  EKG:   CV:  Ablation 11/17/2022  CONCLUSIONS: 1. Successful PVI 2. Successful ablation/isolation of the posterior wall 3. Intracardiac echo reveals trivial pericardial effusion, dilated LA, left sided common ostium 4. No early apparent complications. 5. Colchicine 0.6mg  PO BID x 5 days 6. Protonix 40mg  PO daily x 45 days  Echo 10/14/2022:  IMPRESSIONS    1. In Afib with RVR during study. Left ventricular ejection fraction, by estimation, is 30 to 35%. The left ventricle has moderately decreased function. The left ventricle demonstrates global hypokinesis. Left ventricular diastolic parameters are indeterminate.  2. Right ventricular systolic function is moderately reduced. The right ventricular size is normal. Mildly increased right ventricular wall thickness. There is moderately elevated pulmonary artery systolic pressure. The estimated right ventricular systolic pressure is 47.5 mmHg.  3. Left atrial size was moderately dilated.  4. Right atrial size was severely dilated.  5. The mitral valve is normal in structure. Mild to moderate mitral valve regurgitation.  6. The aortic valve is tricuspid. Aortic valve regurgitation is mild. No aortic stenosis is present.  7. Tricuspid valve regurgitation is mild to moderate.  8. Aortic dilatation noted. There is dilatation of the ascending aorta, measuring 44 mm.  9. The inferior vena cava is dilated in size with <50% respiratory variability, suggesting right atrial pressure  of 15 mmHg.  Past Medical History:  Diagnosis Date   Ascending aortic aneurysm [facility])    Atrial fibrillation (HCC)    Dyslipidemia 09/01/2008   Qualifier: Diagnosis of   By: Lovell Sheehan MD, John E        History of melanoma 02/08/2023    Malignant neoplasm of prostate (HCC) 06/27/2023    Past Surgical History:  Procedure Laterality Date   ATRIAL FIBRILLATION ABLATION N/A 11/17/2022   Procedure: ATRIAL FIBRILLATION ABLATION;  Surgeon: Lanier Prude, MD;  [facility] LAB;  Service: Cardiovascular;  Laterality: N/A;   CARDIOVERSION N/A 10/01/2019   Procedure: CARDIOVERSION;  Surgeon: Elease Hashimoto Deloris Ping, MD;  Location: [facility] ENDOSCOPY;  Service: Cardiovascular;  Laterality: N/A;   CARDIOVERSION N/A 12/16/2019   Procedure: CARDIOVERSION;  Surgeon: [name], MD;  Location: [facility] ENDOSCOPY;  Service: Cardiovascular;  Laterality: N/A;   CARDIOVERSION N/A 10/27/2022   Procedure: CARDIOVERSION;  Surgeon: Jodelle Red, MD;  Location: [facility] ENDOSCOPY;  Service: Cardiovascular;  Laterality: N/A;   CARDIOVERSION N/A 01/13/2023   Procedure: CARDIOVERSION;  Surgeon: Thomasene Ripple, DO;  Location: [facility] ENDOSCOPY;  Service: Cardiovascular;  Laterality: N/A;   CATARACT EXTRACTION, BILATERAL     EYE SURGERY  07/30/2019   PROSTATE BIOPSY      MEDICATIONS:  diltiazem (CARDIZEM CD) 120 MG 24 hr capsule   ELIQUIS 5 MG TABS tablet   metoprolol succinate (TOPROL-XL) 50 MG 24 hr tablet   terbinafine (LAMISIL) 1 % cream   zolpidem (AMBIEN) 10 MG tablet   No current facility-administered medications for this encounter.   Marcille Blanco [facility] Surgical Short Stay/Anesthesiology [facility] Phone [phone] 11/02/2023 10:26 AM

## 2023-11-02 NOTE — Telephone Encounter (Signed)
CALLED PATIENT TO REMIND OF PROCEDURE FOR 11-03-23, SPOKE WITH PATIENT AND HE IS AWARE OF THIS PROCEDURE

## 2023-11-02 NOTE — Anesthesia Preprocedure Evaluation (Addendum)
Anesthesia Evaluation  Patient identified by MRN, date of birth, ID band Patient awake    Reviewed: Allergy & Precautions, H&P , NPO status , Patient's Chart, lab work & pertinent test results  Airway Mallampati: II   Neck ROM: full    Dental   Pulmonary Current Smoker and Patient abstained from smoking.   breath sounds clear to auscultation       Cardiovascular + Peripheral Vascular Disease  + dysrhythmias Atrial Fibrillation  Rhythm:regular Rate:Normal  Ascending aortic aneurysm   Neuro/Psych    GI/Hepatic   Endo/Other    Renal/GU      Musculoskeletal   Abdominal   Peds  Hematology   Anesthesia Other Findings   Reproductive/Obstetrics                             Anesthesia Physical Anesthesia Plan  ASA: 3  Anesthesia Plan: General   Post-op Pain Management:    Induction: Intravenous  PONV Risk Score and Plan: 1 and Ondansetron, Dexamethasone and Treatment may vary due to age or medical condition  Airway Management Planned: Oral ETT  Additional Equipment:   Intra-op Plan:   Post-operative Plan: Extubation in OR  Informed Consent: I have reviewed the patients History and Physical, chart, labs and discussed the procedure including the risks, benefits and alternatives for the proposed anesthesia with the patient or authorized representative who has indicated his/her understanding and acceptance.     Dental advisory given  Plan Discussed with: CRNA, Anesthesiologist and Surgeon  Anesthesia Plan Comments: (See PAT note from 12/5 by Sherlie Ban PA-C )        Anesthesia Quick Evaluation

## 2023-11-03 ENCOUNTER — Ambulatory Visit (HOSPITAL_BASED_OUTPATIENT_CLINIC_OR_DEPARTMENT_OTHER): Payer: Medicare Other | Admitting: Certified Registered Nurse Anesthetist

## 2023-11-03 ENCOUNTER — Ambulatory Visit (HOSPITAL_COMMUNITY): Payer: Medicare Other | Admitting: Medical

## 2023-11-03 ENCOUNTER — Other Ambulatory Visit: Payer: Self-pay

## 2023-11-03 ENCOUNTER — Encounter (HOSPITAL_COMMUNITY): Payer: Self-pay | Admitting: Urology

## 2023-11-03 ENCOUNTER — Ambulatory Visit (HOSPITAL_COMMUNITY)
Admission: RE | Admit: 2023-11-03 | Discharge: 2023-11-03 | Disposition: A | Discharge status: Home / Self Care | Payer: Medicare Other | Attending: Urology | Admitting: Urology

## 2023-11-03 ENCOUNTER — Encounter (HOSPITAL_COMMUNITY)
Admission: RE | Disposition: A | Discharge status: Home / Self Care | Payer: Self-pay | Source: Home / Self Care | Attending: Urology

## 2023-11-03 ENCOUNTER — Ambulatory Visit (HOSPITAL_COMMUNITY): Payer: Medicare Other

## 2023-11-03 DIAGNOSIS — F17290 Nicotine dependence, other tobacco product, uncomplicated: Secondary | ICD-10-CM | POA: Insufficient documentation

## 2023-11-03 DIAGNOSIS — C61 Malignant neoplasm of prostate: Secondary | ICD-10-CM | POA: Insufficient documentation

## 2023-11-03 DIAGNOSIS — I4891 Unspecified atrial fibrillation: Secondary | ICD-10-CM | POA: Diagnosis not present

## 2023-11-03 DIAGNOSIS — I739 Peripheral vascular disease, unspecified: Secondary | ICD-10-CM | POA: Insufficient documentation

## 2023-11-03 HISTORY — PX: RADIOACTIVE SEED IMPLANT: SHX5150

## 2023-11-03 HISTORY — PX: SPACE OAR INSTILLATION: SHX6769

## 2023-11-03 SURGERY — INSERTION, RADIATION SOURCE, PROSTATE
Anesthesia: General

## 2023-11-03 MED ORDER — IOPAMIDOL (ISOVUE-300) INJECTION 61%
INTRAVENOUS | Status: DC | PRN
  Administered 2023-11-03: 50 mL via URETHRAL

## 2023-11-03 MED ORDER — ROCURONIUM BROMIDE 10 MG/ML (PF) SYRINGE
PREFILLED_SYRINGE | INTRAVENOUS | Status: DC | PRN
  Administered 2023-11-03: 60 mg via INTRAVENOUS
  Administered 2023-11-03: 20 mg via INTRAVENOUS

## 2023-11-03 MED ORDER — ROCURONIUM BROMIDE 10 MG/ML (PF) SYRINGE
PREFILLED_SYRINGE | INTRAVENOUS | Status: AC
  Filled 2023-11-03: qty 10

## 2023-11-03 MED ORDER — CEFAZOLIN SODIUM-DEXTROSE 2-4 GM/100ML-% IV SOLN
2.0000 g | Freq: Once | INTRAVENOUS | Status: AC
  Administered 2023-11-03: 2 g via INTRAVENOUS
  Filled 2023-11-03: qty 100

## 2023-11-03 MED ORDER — OXYCODONE HCL 5 MG PO TABS: 5.0000 mg | ORAL_TABLET | Freq: Once | ORAL | Status: DC | PRN

## 2023-11-03 MED ORDER — DEXAMETHASONE SODIUM PHOSPHATE 10 MG/ML IJ SOLN
INTRAMUSCULAR | Status: AC
  Filled 2023-11-03: qty 1

## 2023-11-03 MED ORDER — LIDOCAINE HCL (PF) 2 % IJ SOLN
INTRAMUSCULAR | Status: AC
  Filled 2023-11-03: qty 5

## 2023-11-03 MED ORDER — ONDANSETRON HCL 4 MG/2ML IJ SOLN: 4.0000 mg | Freq: Four times a day (QID) | INTRAMUSCULAR | Status: DC | PRN

## 2023-11-03 MED ORDER — ONDANSETRON HCL 4 MG/2ML IJ SOLN
INTRAMUSCULAR | Status: DC | PRN
  Administered 2023-11-03: 4 mg via INTRAVENOUS

## 2023-11-03 MED ORDER — OXYCODONE HCL 5 MG/5ML PO SOLN: 5.0000 mg | Freq: Once | ORAL | Status: DC | PRN

## 2023-11-03 MED ORDER — SODIUM CHLORIDE (PF) 0.9 % IJ SOLN
INTRAMUSCULAR | Status: AC
  Filled 2023-11-03: qty 10

## 2023-11-03 MED ORDER — ORAL CARE MOUTH RINSE: 15.0000 mL | Freq: Once | OROMUCOSAL | Status: AC

## 2023-11-03 MED ORDER — FENTANYL CITRATE (PF) 100 MCG/2ML IJ SOLN
INTRAMUSCULAR | Status: AC
  Filled 2023-11-03: qty 2

## 2023-11-03 MED ORDER — LACTATED RINGERS IV SOLN
INTRAVENOUS | Status: DC
Start: 2023-11-03 — End: 2023-11-03

## 2023-11-03 MED ORDER — TRAMADOL HCL 50 MG PO TABS: 50.0000 mg | ORAL_TABLET | Freq: Four times a day (QID) | ORAL | 0 refills | Status: AC | PRN

## 2023-11-03 MED ORDER — FLEET ENEMA RE ENEM
1.0000 | ENEMA | Freq: Once | RECTAL | Status: DC
  Filled 2023-11-03: qty 1

## 2023-11-03 MED ORDER — ONDANSETRON HCL 4 MG/2ML IJ SOLN
INTRAMUSCULAR | Status: AC
Start: 2023-11-03 — End: ?
  Filled 2023-11-03: qty 2

## 2023-11-03 MED ORDER — FENTANYL CITRATE (PF) 100 MCG/2ML IJ SOLN
INTRAMUSCULAR | Status: DC | PRN
  Administered 2023-11-03 (×2): 50 ug via INTRAVENOUS

## 2023-11-03 MED ORDER — FENTANYL CITRATE PF 50 MCG/ML IJ SOSY: 25.0000 ug | PREFILLED_SYRINGE | INTRAMUSCULAR | Status: DC | PRN

## 2023-11-03 MED ORDER — STERILE WATER FOR IRRIGATION IR SOLN
Status: DC | PRN
  Administered 2023-11-03: 500 mL

## 2023-11-03 MED ORDER — DEXAMETHASONE SODIUM PHOSPHATE 4 MG/ML IJ SOLN
INTRAMUSCULAR | Status: DC | PRN
  Administered 2023-11-03: 5 mg via INTRAVENOUS

## 2023-11-03 MED ORDER — SUGAMMADEX SODIUM 200 MG/2ML IV SOLN
INTRAVENOUS | Status: DC | PRN
  Administered 2023-11-03: 200 mg via INTRAVENOUS

## 2023-11-03 MED ORDER — CHLORHEXIDINE GLUCONATE 0.12 % MT SOLN
15.0000 mL | Freq: Once | OROMUCOSAL | Status: AC
  Administered 2023-11-03: 15 mL via OROMUCOSAL

## 2023-11-03 MED ORDER — SODIUM CHLORIDE (PF) 0.9 % IJ SOLN
INTRAMUSCULAR | Status: DC | PRN
  Administered 2023-11-03: 2 mL

## 2023-11-03 MED ORDER — PROPOFOL 10 MG/ML IV BOLUS
INTRAVENOUS | Status: DC | PRN
  Administered 2023-11-03: 140 mg via INTRAVENOUS

## 2023-11-03 MED ORDER — LIDOCAINE 2% (20 MG/ML) 5 ML SYRINGE
INTRAMUSCULAR | Status: DC | PRN
  Administered 2023-11-03: 60 mg via INTRAVENOUS

## 2023-11-03 SURGICAL SUPPLY — 36 items
BAG COUNTER SPONGE SURGICOUNT (BAG) IMPLANT
BAG URINE DRAIN 2000ML AR STRL (UROLOGICAL SUPPLIES) ×1 IMPLANT
CATH FOLEY 2WAY SLVR 5CC 16FR (CATHETERS) ×2 IMPLANT
CATH ROBINSON RED A/P 20FR (CATHETERS) ×1 IMPLANT
COVER BACK TABLE 60X90IN (DRAPES) ×1 IMPLANT
COVER MAYO STAND STRL (DRAPES) ×1 IMPLANT
COVER SURGICAL LIGHT HANDLE (MISCELLANEOUS) ×1 IMPLANT
DRAPE U-SHAPE 47X51 STRL (DRAPES) ×1 IMPLANT
DRSG TEGADERM 4X4.75 (GAUZE/BANDAGES/DRESSINGS) ×1 IMPLANT
DRSG TEGADERM 8X12 (GAUZE/BANDAGES/DRESSINGS) ×1 IMPLANT
GAUZE SPONGE 4X4 12PLY STRL (GAUZE/BANDAGES/DRESSINGS) IMPLANT
GLOVE BIO SURGEON STRL SZ7.5 (GLOVE) ×1 IMPLANT
GLOVE ECLIPSE 8.0 STRL XLNG CF (GLOVE) ×1 IMPLANT
GLOVE SURG LX STRL 7.5 STRW (GLOVE) ×2 IMPLANT
GOWN STRL REUS W/ TWL LRG LVL3 (GOWN DISPOSABLE) ×2 IMPLANT
GRID BRACH TEMP 18GA 2.8X3X.75 (MISCELLANEOUS) IMPLANT
HOLDER FOLEY CATH W/STRAP (MISCELLANEOUS) ×1 IMPLANT
IMPL SPACEOAR VUE SYSTEM (Spacer) ×1 IMPLANT
IMPLANT SPACEOAR VUE SYSTEM (Spacer) ×1 IMPLANT
KIT TURNOVER KIT A (KITS) IMPLANT
MARKER SKIN DUAL TIP RULER LAB (MISCELLANEOUS) ×1 IMPLANT
NDL BRACHY 18G 5PK (NEEDLE) ×5 IMPLANT
NDL BRACHY 18G SINGLE (NEEDLE) ×1 IMPLANT
NDL PK MORGANSTERN STABILIZ (NEEDLE) IMPLANT
NEEDLE BRACHY 18G 5PK (NEEDLE) ×4 IMPLANT
NEEDLE BRACHY 18G SINGLE (NEEDLE) IMPLANT
NEEDLE PK MORGANSTERN STABILIZ (NEEDLE) ×1 IMPLANT
PACK CYSTO (CUSTOM PROCEDURE TRAY) ×1 IMPLANT
PENCIL SMOKE EVACUATOR (MISCELLANEOUS) IMPLANT
QuickLink cartridges with BrachySource IMPLANT
SHEATH ULTRASOUND LF (SHEATH) IMPLANT
SHEATH ULTRASOUND LTX NONSTRL (SHEATH) IMPLANT
SURGILUBE 2OZ TUBE FLIPTOP (MISCELLANEOUS) ×1 IMPLANT
SYR 10ML LL (SYRINGE) ×1 IMPLANT
TOWEL OR 17X26 10 PK STRL BLUE (TOWEL DISPOSABLE) ×1 IMPLANT
UNDERPAD 30X36 HEAVY ABSORB (UNDERPADS AND DIAPERS) ×1 IMPLANT

## 2023-11-03 NOTE — Op Note (Signed)
PATIENT:  Sean [name]  PRE-OPERATIVE DIAGNOSIS:  Adenocarcinoma of the prostate  POST-OPERATIVE DIAGNOSIS:  Same  PROCEDURE:  1. I-125 radioactive seed implantation 2. Cystoscopy  3. Placement of SpaceOAR  SURGEON:  Rhoderick Moody, MD  Radiation oncologist: Margaretmary Dys, MD  ANESTHESIA:  General  EBL:  Minimal  DRAINS: None  INDICATION: Sean [name] is a 67 year old male with a history of grade 2 prostate cancer.  He has elected to proceed with brachytherapy seed placement for primary treatment of his prostate cancer.  He has been consented for the above procedures, voices understanding and wishes to proceed.  Description of procedure: After informed consent the patient was brought to the major OR, placed on the table and administered general anesthesia. He was then moved to the modified lithotomy position with his perineum perpendicular to the floor. His perineum and genitalia were then sterilely prepped. An official timeout was then performed. A 16 French Foley catheter was then placed in the bladder and filled with dilute contrast, a rectal tube was placed in the rectum and the transrectal ultrasound probe was placed in the rectum and affixed to the stand. He was then sterilely draped.  Real time ultrasonography was used along with the seed planning software. This was used to develop the seed plan including the number of needles as well as number of seeds required for complete and adequate coverage. Real-time ultrasonography was then used along with the previously developed plan and the Nucletron device to implant a total of 58 seeds using 18 needles. This proceeded without difficulty or complication.   I then proceeded with placement of SpaceOAR by introducing a needle with the bevel angled inferiorly approximately 2 cm superior to the anus. This was angled downward and under direct ultrasound was placed within the space between the prostatic capsule and rectum. This  was confirmed with a small amount of sterile saline injected and this was performed under direct ultrasound. I then attached the SpaceOAR to the needle and injected this in the space between the prostate and rectum with good placement noted.  A Foley catheter was then removed as well as the transrectal ultrasound probe and rectal probe. Flexible cystoscopy was then performed using the 16 French flexible scope which revealed a normal urethra throughout its length down to the sphincter which appeared intact. The prostatic urethra revealed bilobar hypertrophy but no evidence of obstruction, seeds, spacers or lesions. The bladder was then entered and fully and systematically inspected. The ureteral orifices were noted to be of normal configuration and position. The mucosa revealed no evidence of tumors. There were also no stones identified within the bladder. I noted no seeds or spacers on the floor of the bladder and retroflexion of the scope revealed no seeds protruding from the base of the prostate.  The cystoscope was then removed and the patient was awakened and taken to recovery room in stable and satisfactory condition. He tolerated procedure well and there were no intraoperative complications.   Plan: Discharge home

## 2023-11-03 NOTE — Transfer of Care (Signed)
Immediate Anesthesia Transfer of Care Note  Patient: Sean [name]  Procedure(s) Performed: RADIOACTIVE SEED IMPLANT/BRACHYTHERAPY IMPLANT SPACE OAR INSTILLATION  Patient Location: PACU  Anesthesia Type:General  Level of Consciousness: awake and patient cooperative  Airway & Oxygen Therapy: Patient Spontanous Breathing and Patient connected to face mask  Post-op Assessment: Report given to RN and Post -op Vital signs reviewed and stable  Post vital signs: Reviewed and stable  Last Vitals:  Vitals Value Taken Time  BP 126/77 11/03/23 1530  Temp    Pulse 66 11/03/23 1531  Resp 15 11/03/23 1531  SpO2 100 % 11/03/23 1531  Vitals shown include unfiled device data.  Last Pain:  Vitals:   11/03/23 1130  TempSrc:   PainSc: 0-No pain         Complications:  Encounter Notable Events  Notable Event Outcome Phase Comment  Difficult to intubate - expected  Intraprocedure Filed from anesthesia note documentation.

## 2023-11-03 NOTE — H&P (Signed)
Urology Preoperative H&P   Chief Complaint: Prostate cancer   History of Present Illness: Sean [name] is a 67 y.o. male with T1c, grade 2 prostate cancer.   Last PSA: 4.97 (01/2023). PSA has been gradually rising over the past several years. No prior PNBx. No family history of prostate cancer.  AUASS: 3/1  SHIM: 25   He has elected to proceed with brachytherapy seed placement as primary treatment of his prostate cancer.  He is voiding without difficulty and denies interval UTIs, dysuria or hematuria   Past Medical History:  Diagnosis Date   Ascending aortic aneurysm (HCC)    Atrial fibrillation (HCC)    Dyslipidemia 09/01/2008   Qualifier: Diagnosis of   By: Lovell Sheehan MD, John E        History of melanoma 02/08/2023   Malignant neoplasm of prostate (HCC) 06/27/2023    Past Surgical History:  Procedure Laterality Date   ATRIAL FIBRILLATION ABLATION N/A 11/17/2022   Procedure: ATRIAL FIBRILLATION ABLATION;  Surgeon: Lanier Prude, MD;  [facility] INVASIVE CV LAB;  Service: Cardiovascular;  Laterality: N/A;   CARDIOVERSION N/A 10/01/2019   Procedure: CARDIOVERSION;  Surgeon: Elease Hashimoto Deloris Ping, MD;  Location: [facility] ENDOSCOPY;  Service: Cardiovascular;  Laterality: N/A;   CARDIOVERSION N/A 12/16/2019   Procedure: CARDIOVERSION;  Surgeon: [name], MD;  Location: [facility] ENDOSCOPY;  Service: Cardiovascular;  Laterality: N/A;   CARDIOVERSION N/A 10/27/2022   Procedure: CARDIOVERSION;  Surgeon: Jodelle Red, MD;  Location: [facility] ENDOSCOPY;  Service: Cardiovascular;  Laterality: N/A;   CARDIOVERSION N/A 01/13/2023   Procedure: CARDIOVERSION;  Surgeon: Thomasene Ripple, DO;  Location: [facility] ENDOSCOPY;  Service: Cardiovascular;  Laterality: N/A;   CATARACT EXTRACTION, BILATERAL     EYE SURGERY  07/30/2019   PROSTATE BIOPSY      Allergies:  Allergies  Allergen Reactions   Malarone [Atovaquone-Proguanil Hcl]     Upset stomach, stomach pain   Quinolones     Aortic aneurysm     Family History  Problem Relation Age of Onset   Colon cancer Mother    Cancer Brother 52       testicular cancer    Social History:  reports that he has been smoking cigars. He has never used smokeless tobacco. He reports current alcohol use of about 4.0 - 5.0 standard drinks of alcohol per week. He reports that he does not use drugs.  ROS: A complete review of systems was performed.  All systems are negative except for pertinent findings as noted.  Physical Exam:  Vital signs in last 24 hours: Temp:  [97.6 F (36.4 C)] 97.6 F (36.4 C) (12/06 1122) Pulse Rate:  [76] 76 (12/06 1122) Resp:  [18] 18 (12/06 1122) BP: (117)/(83) 117/83 (12/06 1122) SpO2:  [96 %] 96 % (12/06 1122) Weight:  [89 kg] 89 kg (12/06 1130) Constitutional:  Alert and oriented, No acute distress Cardiovascular: Regular rate and rhythm, No JVD Respiratory: Normal respiratory effort, Lungs clear bilaterally GI: Abdomen is soft, nontender, nondistended, no abdominal masses GU: No CVA tenderness Lymphatic: No lymphadenopathy Neurologic: Grossly intact, no focal deficits Psychiatric: Normal mood and affect  Laboratory Data:  Recent Labs    11/02/23 0947  WBC 7.0  HGB 15.1  HCT 43.4  PLT 190    Recent Labs    11/02/23 0947  NA 136  K 4.0  CL 107  GLUCOSE 109*  BUN 18  CALCIUM 9.2  CREATININE 0.84     No results found for this or any  previous visit (from the past 24 hour(s)). No results found for this or any previous visit (from the past 240 hour(s)).  Renal Function: Recent Labs    11/02/23 0947  CREATININE 0.84   Estimated Creatinine Clearance: 97.8 mL/min (by C-G formula based on SCr of 0.84 mg/dL).  Radiologic Imaging: No results found.  I independently reviewed the above imaging studies.  Assessment and Plan Sean [name] is a 67 y.o. male with grade 2 prostate cancer   -The patient was counseled about the natural history of prostate cancer and the standard treatment  options that are available for prostate cancer. It was explained to him how his age and life expectancy, clinical stage, Gleason score, and PSA affect his prognosis, the decision to proceed with additional staging studies, as well as how that information influences recommended treatment strategies. We discussed the roles for active surveillance, radiation therapy, surgical therapy, androgen deprivation, as well as ablative therapy options for the treatment of prostate cancer as appropriate to his individual cancer situation. We discussed the risks and benefits of these options with regard to their impact on cancer control and also in terms of potential adverse events, complications, and impact on quality of life particularly related to urinary and sexual function. The patient was encouraged to ask questions throughout the discussion today and all questions were answered to his stated satisfaction. In addition, the patient was provided with and/or directed to appropriate resources and literature for further education about prostate cancer and treatment options.   The patient has decided to proceed with cystoscopy, brachytherapy seed and SpaceOAR placement as primary treatment of his risk prostate cancer.  The risks, benefits and alternatives of the aforementioned procedures was discussed in detail.  Risks include, bur are not limited to worsening LUTS, erectile dysfunction, rectal irritation, urethral stricture formation, fistula formation, cancer recurrence, MI, CVA, PE, DVT and the inherent risk of general anesthesia.  He voices understanding and wishes to proceed.      Rhoderick Moody, MD 11/03/2023, 12:22 PM  [facility] Pager: [phone]

## 2023-11-03 NOTE — Anesthesia Procedure Notes (Signed)
Procedure Name: Intubation Date/Time: 11/03/2023 2:00 PM  Performed by: Vanessa Farina, CRNAPre-anesthesia Checklist: Patient identified, Emergency Drugs available, Suction available and Patient being monitored Patient Re-evaluated:Patient Re-evaluated prior to induction Oxygen Delivery Method: Circle system utilized Preoxygenation: Pre-oxygenation with 100% oxygen Induction Type: IV induction Ventilation: Mask ventilation without difficulty Laryngoscope Size: 2 and Miller Grade View: Grade II Tube type: Oral Tube size: 7.5 mm Number of attempts: 1 Airway Equipment and Method: Stylet Placement Confirmation: ETT inserted through vocal cords under direct vision, positive ETCO2 and breath sounds checked- equal and bilateral Secured at: 22 cm Tube secured with: Tape Dental Injury: Teeth and Oropharynx as per pre-operative assessment  Difficulty Due To: Difficulty was anticipated and Difficult Airway- due to reduced neck mobility

## 2023-11-04 NOTE — Progress Notes (Signed)
  Radiation Oncology         [phone] ________________________________  Name: BRESLIN PUEBLA MRN: [id number]  Date: 11/04/2023  DOB: 03/27/1956       Prostate Seed Implant  WU:[id number], [name], MD  Shelly Rubenstein*  DIAGNOSIS:  67 y.o. gentleman with Stage T1c adenocarcinoma of the prostate with Gleason Score of 3+4, and PSA of 5.   Oncology History  Malignant neoplasm of prostate (HCC)  05/25/2023 Cancer Staging   Staging form: Prostate, AJCC 8th Edition - Clinical stage from 05/25/2023: Stage IIB (cT1c, cN0, cM0, PSA: 5, Grade Group: 2) - Signed by Marcello Fennel, PA-C on 06/27/2023 Histopathologic type: Adenocarcinoma, NOS Stage prefix: Initial diagnosis Prostate specific antigen (PSA) range: Less than 10 Gleason primary pattern: 3 Gleason secondary pattern: 4 Gleason score: 7 Histologic grading system: 5 grade system Number of biopsy cores examined: 15 Number of biopsy cores positive: 5 Location of positive needle core biopsies: One side   06/27/2023 Initial Diagnosis   Malignant neoplasm of prostate (HCC)     No diagnosis found.  PROCEDURE: Insertion of radioactive I-125 seeds into the prostate gland.  RADIATION DOSE: 145 Gy, definitive therapy.  TECHNIQUE: MAIJOR GINES was brought to the operating room with the urologist. He was placed in the dorsolithotomy position. He was catheterized and a rectal tube was inserted. The perineum was shaved, prepped and draped. The ultrasound probe was then introduced by me into the rectum to see the prostate gland.  TREATMENT DEVICE: I attached the needle grid to the ultrasound probe stand and anchor needles were placed.  3D PLANNING: The prostate was imaged in 3D using a sagittal sweep of the prostate probe. These images were transferred to the planning computer. There, the prostate, urethra and rectum were defined on each axial reconstructed image. Then, the software created an optimized 3D plan and a few seed  positions were adjusted. The quality of the plan was reviewed using [facility] information for the target and the following two organs at risk:  Urethra and Rectum.  Then the accepted plan was printed and handed off to the radiation therapist.  Under my supervision, the custom loading of the seeds and spacers was carried out using the quick loader.  These pre-loaded needles were then placed into the needle holder.Marland Kitchen  PROSTATE VOLUME STUDY:  Using transrectal ultrasound the volume of the prostate was verified to be 39.1 cc.  SPECIAL TREATMENT PROCEDURE/SUPERVISION AND HANDLING: The pre-loaded needles were then delivered by the urologist under sagittal guidance. A total of 18 needles were used to deposit 57 seeds in the prostate gland. The individual seed activity was 0.505 mCi.  SpaceOAR:  Yes  COMPLEX SIMULATION: At the end of the procedure, an anterior radiograph of the pelvis was obtained to document seed positioning and count. Cystoscopy was performed by the urologist to check the urethra and bladder.  MICRODOSIMETRY: At the end of the procedure, the patient was emitting 0.088 mR/hr at 1 meter. Accordingly, he was considered safe for hospital discharge.  PLAN: The patient will return to the radiation oncology clinic for post implant CT dosimetry in three weeks.   ________________________________  Artist Pais Kathrynn Running, M.D.

## 2023-11-05 NOTE — Anesthesia Postprocedure Evaluation (Signed)
Anesthesia Post Note  Patient: Sean [name]  Procedure(s) Performed: RADIOACTIVE SEED IMPLANT/BRACHYTHERAPY IMPLANT SPACE OAR INSTILLATION     Patient location during evaluation: PACU Anesthesia Type: General Level of consciousness: awake and alert Pain management: pain level controlled Vital Signs Assessment: post-procedure vital signs reviewed and stable Respiratory status: spontaneous breathing, nonlabored ventilation, respiratory function stable and patient connected to nasal cannula oxygen Cardiovascular status: blood pressure returned to baseline and stable Postop Assessment: no apparent nausea or vomiting Anesthetic complications: yes   Encounter Notable Events  Notable Event Outcome Phase Comment  Difficult to intubate - expected  Intraprocedure Filed from anesthesia note documentation.    Last Vitals:  Vitals:   11/03/23 1600 11/03/23 1616  BP: (!) 128/99 (!) 144/91  Pulse: 73 72  Resp: 11 15  Temp:    SpO2: 100% 99%    Last Pain:  Vitals:   11/03/23 1616  TempSrc:   PainSc: 0-No pain                 Zeus Marquis S

## 2023-11-06 ENCOUNTER — Encounter (HOSPITAL_COMMUNITY): Payer: Self-pay | Admitting: Urology

## 2023-11-14 ENCOUNTER — Telehealth: Payer: Self-pay | Admitting: *Deleted

## 2023-11-14 ENCOUNTER — Encounter: Payer: Self-pay | Admitting: Urology

## 2023-11-14 NOTE — Telephone Encounter (Signed)
CALLED PATIENT TO REMIND OF POST SEED APPTS. FOR 11-15-23, SPOKE WITH PATIENT AND HE IS AWARE OF THESE APPTS.

## 2023-11-14 NOTE — Progress Notes (Signed)
Post-seed nursing interview for a diagnosis of Stage T1c adenocarcinoma of the prostate with Gleason Score of 3+4, and PSA of 5.  Patient identity verified x2.   Patient reports polyuria. Patient denies all other related issues at this time.  Meaningful use complete.  I-PSS score- 5 - Mild SHIM score- 21 Urinary Management medication(s) None Urology appointment date- 12/06/2023 with Dr. Liliane Shi at [facility]  Vitals- See today's vitals section 11/15/2023  This concludes the interaction.  Sean Favors, LPN

## 2023-11-15 ENCOUNTER — Ambulatory Visit
Admission: RE | Admit: 2023-11-15 | Discharge: 2023-11-15 | Disposition: A | Discharge status: Home / Self Care | Payer: PRIVATE HEALTH INSURANCE | Source: Ambulatory Visit | Attending: Urology | Admitting: Urology

## 2023-11-15 ENCOUNTER — Ambulatory Visit
Admission: RE | Admit: 2023-11-15 | Discharge: 2023-11-15 | Disposition: A | Discharge status: Home / Self Care | Payer: Medicare Other | Source: Ambulatory Visit | Attending: Radiation Oncology | Admitting: Radiation Oncology

## 2023-11-15 VITALS — BP 113/86 | HR 74 | Temp 98.2°F | Resp 18 | Ht 73.0 in | Wt 204.0 lb

## 2023-11-15 DIAGNOSIS — C61 Malignant neoplasm of prostate: Secondary | ICD-10-CM | POA: Insufficient documentation

## 2023-11-15 NOTE — Progress Notes (Signed)
  Radiation Oncology         [phone] ________________________________  Name: Sean [name] MRN: [id number]  Date: 11/15/2023  DOB: 1956-11-04  COMPLEX SIMULATION NOTE  NARRATIVE:  The patient was brought to the CT Simulation planning suite today following prostate seed implantation approximately one month ago.  Identity was confirmed.  All relevant records and images related to the planned course of therapy were reviewed.  Then, the patient was set-up supine.  CT images were obtained.  The CT images were loaded into the planning software.  Then the prostate and rectum were contoured.  Treatment planning then occurred.  The implanted iodine 125 seeds were identified by the physics staff for projection of radiation distribution  I have requested : 3D Simulation  I have requested a DVH of the following structures: Prostate and rectum.    ________________________________  Artist Pais Kathrynn Running, M.D.

## 2023-11-15 NOTE — Progress Notes (Signed)
Radiation Oncology         [phone] ________________________________  Name: Sean [name] MRN: [id number]  Date: 11/15/2023  DOB: [birth year]  Post-Seed Follow-Up Visit Note  CC: Kristian Covey, MD  Shelly Rubenstein*  Diagnosis:   67 y.o. gentleman with Stage T1c adenocarcinoma of the prostate with Gleason score of 3+4, and PSA of 4.97.     ICD-10-CM   1. Malignant neoplasm of prostate (HCC)  C61       Interval Since Last Radiation:  2 weeks 11/03/23:  Insertion of radioactive I-125 seeds into the prostate gland; 145 Gy, definitive therapy with placement of SpaceOAR gel.  Narrative:  The patient returns today for routine follow-up.  He is complaining of increased urinary frequency and urinary hesitation symptoms. He filled out a questionnaire regarding urinary function today providing and overall IPSS score of 21 characterizing his symptoms as severe with frequency, urgency and nocturia.  He specifically denies dysuria, gross hematuria, straining to void or incontinence.  His pre-implant score was 5.  He is not currently taking any medications for his LUTS and does not feel like he needs any at this point.  He denies any abdominal pain or bowel symptoms.  He reports a healthy appetite and is maintaining his weight.  He has not noticed any significant change in his energy level and overall, is quite pleased with his progress to date.  ALLERGIES:  is allergic to malarone [atovaquone-proguanil hcl] and quinolones.  Meds: Current Outpatient Medications  Medication Sig Dispense Refill   diltiazem (CARDIZEM CD) 120 MG 24 hr capsule Take 1 capsule (120 mg total) by mouth daily. 30 capsule 6   ELIQUIS 5 MG TABS tablet TAKE 1 TABLET BY MOUTH TWICE A DAY 180 tablet 2   metoprolol succinate (TOPROL-XL) 50 MG 24 hr tablet TAKE 1 TABLET BY MOUTH DAILY. TAKE WITH OR IMMEDIATELY FOLLOWING A MEAL. 90 tablet 0   terbinafine (LAMISIL) 1 % cream Apply 1 Application topically in the  morning.     zolpidem (AMBIEN) 10 MG tablet Take 1 tablet (10 mg total) by mouth at bedtime as needed. for sleep 30 tablet 1   No current facility-administered medications for this encounter.    Physical Findings: In general this is a well appearing Caucasian male in no acute distress. He's alert and oriented x4 and appropriate throughout the examination. Cardiopulmonary assessment is negative for acute distress and he exhibits normal effort.   Lab Findings: Lab Results  Component Value Date   WBC 7.0 11/02/2023   HGB 15.1 11/02/2023   HCT 43.4 11/02/2023   MCV 97.7 11/02/2023   PLT 190 11/02/2023    Radiographic Findings:  Patient underwent CT imaging in our clinic for post implant dosimetry. The CT will be reviewed by Dr. Kathrynn Running to confirm there is an adequate distribution of radioactive seeds throughout the prostate gland and ensure that there are no seeds in or near the rectum.  We suspect the final radiation plan and dosimetry will show appropriate coverage of the prostate gland. He understands that we will call and inform him of any unexpected findings on further review of his imaging and dosimetry.  Impression/Plan: 67 y.o. gentleman with Stage T1c adenocarcinoma of the prostate with Gleason score of 3+4, and PSA of 4.97.  The patient is recovering from the effects of radiation. His urinary symptoms should gradually improve over the next 4-6 months. We talked about this today. He is encouraged by his improvement already and is otherwise  pleased with his outcome. We also talked about long-term follow-up for prostate cancer following seed implant. He understands that ongoing PSA determinations and digital rectal exams will help perform surveillance to rule out disease recurrence. He has a follow up appointment scheduled with Anne Fu, NP on 12/06/2023. He understands what to expect with his PSA measures. Patient was also educated today about some of the long-term effects from radiation  including a small risk for rectal bleeding and possibly erectile dysfunction. We talked about some of the general management approaches to these potential complications. However, I did encourage the patient to contact our office or return at any point if he has questions or concerns related to his previous radiation and prostate cancer.  We enjoyed taking care of him and look forward to continuing to follow his progress via correspondence.    Marguarite Arbour, PA-C

## 2023-11-17 ENCOUNTER — Encounter: Payer: Self-pay | Admitting: Radiation Oncology

## 2023-11-17 DIAGNOSIS — C61 Malignant neoplasm of prostate: Secondary | ICD-10-CM | POA: Diagnosis not present

## 2023-11-17 NOTE — Progress Notes (Signed)
  Radiation Oncology         [phone] ________________________________  Name: Sean [name] MRN: [id number]  Date: 11/17/2023  DOB: [birth year]  3D Planning Note   Prostate Brachytherapy Post-Implant Dosimetry  Diagnosis: 67 y.o. gentleman with Stage T1c adenocarcinoma of the prostate with Gleason Score of 3+4, and PSA of 5.   Narrative: On a previous date, Sean [name] returned following prostate seed implantation for post implant planning. He underwent CT scan complex simulation to delineate the three-dimensional structures of the pelvis and demonstrate the radiation distribution.  Since that time, the seed localization, and complex isodose planning with dose volume histograms have now been completed.  Results:   Prostate Coverage - The dose of radiation delivered to the 90% or more of the prostate gland (D90) was 98.77% of the prescription dose. This exceeds our goal of greater than 90%. Rectal Sparing - The volume of rectal tissue receiving the prescription dose or higher was 0.0 cc. This falls under our thresholds tolerance of 1.0 cc.  Impression: The prostate seed implant appears to show adequate target coverage and appropriate rectal sparing.  Plan:  The patient will continue to follow with urology for ongoing PSA determinations. I would anticipate a high likelihood for local tumor control with minimal risk for rectal morbidity.  ________________________________  Artist Pais Kathrynn Running, M.D.

## 2023-11-17 NOTE — Radiation Completion Notes (Signed)
Patient Name: NYSHAWN, SCAMARDO MRN: [id number] Date of Birth: 14-Mar-1956 Referring Physician: Rhoderick Moody, M.D. Date of Service: 2023-11-17 Radiation Oncologist: Margaretmary Bayley, M.D. [facility] [place]                             RADIATION ONCOLOGY END OF TREATMENT NOTE     Diagnosis: C61 Malignant neoplasm of prostate Staging on 2023-05-25: Malignant neoplasm of prostate (HCC) T=cT1c, N=cN0, M=cM0 Intent: Curative     ==========DELIVERED PLANS==========  Prostate Seed Implant Date: 2023-11-03   Plan Name: Prostate Seed Implant Site: Prostate Technique: Radioactive Seed Implant I-125 Mode: Brachytherapy Dose Per Fraction: 145 Gy Prescribed Dose (Delivered / Prescribed): 145 Gy / 145 Gy Prescribed Fxs (Delivered / Prescribed): 1 / 1     ==========ON TREATMENT VISIT DATES========== 2023-11-03     ==========UPCOMING VISITS==========

## 2023-12-07 ENCOUNTER — Other Ambulatory Visit: Payer: Self-pay | Admitting: Thoracic Surgery (Cardiothoracic Vascular Surgery)

## 2023-12-07 ENCOUNTER — Encounter: Payer: Self-pay | Admitting: *Deleted

## 2023-12-07 DIAGNOSIS — I7121 Aneurysm of the ascending aorta, without rupture: Secondary | ICD-10-CM

## 2023-12-11 ENCOUNTER — Encounter: Payer: Self-pay | Admitting: *Deleted

## 2023-12-12 ENCOUNTER — Encounter: Payer: Self-pay | Admitting: *Deleted

## 2024-01-03 ENCOUNTER — Telehealth: Payer: Self-pay | Admitting: Family Medicine

## 2024-01-03 NOTE — Telephone Encounter (Signed)
 Copied from CRM [id number]. Topic: Appointments - Appointment Scheduling >> Jan 03, 2024 10:12 AM Robinson H wrote: Patient called for clarification on his awv visit on 3/18, patient was inquiring about an in person visit with Dr. Micheal for an AWV appointment shows as video. Please reach out to patient for some clarity, thanks.  [place] [phone]

## 2024-01-03 NOTE — Telephone Encounter (Signed)
 Left a message for the patient to return my call.

## 2024-01-05 ENCOUNTER — Inpatient Hospital Stay: Payer: Medicare Other | Attending: Adult Health | Admitting: *Deleted

## 2024-01-05 ENCOUNTER — Encounter: Payer: Self-pay | Admitting: *Deleted

## 2024-01-05 DIAGNOSIS — C61 Malignant neoplasm of prostate: Secondary | ICD-10-CM

## 2024-01-05 NOTE — Progress Notes (Addendum)
 SCP reviewed and completed. Last colonoscopy was 08/14/2023. Next due 08/13/2028. Vaccine UTD.

## 2024-01-09 ENCOUNTER — Telehealth: Payer: Self-pay

## 2024-01-09 NOTE — Telephone Encounter (Signed)
Copied from CRM [id number]. Topic: General - Other >> Jan 09, 2024  2:50 PM Orinda Kenner C wrote: Reason for CRM: Patient [phone] is returning the office, he's unsure what the call is about, could not provide anymore details. Please call back.

## 2024-01-10 NOTE — Telephone Encounter (Signed)
Please see previous encounter

## 2024-01-10 NOTE — Telephone Encounter (Signed)
Patient provided clarification on upcoming medicare visit with Dr. Selena Batten

## 2024-01-17 ENCOUNTER — Other Ambulatory Visit: Payer: Self-pay | Admitting: Cardiovascular Disease

## 2024-01-26 ENCOUNTER — Ambulatory Visit
Admission: RE | Admit: 2024-01-26 | Discharge: 2024-01-26 | Disposition: A | Discharge status: Home / Self Care | Payer: Medicare Other | Source: Ambulatory Visit | Attending: Thoracic Surgery (Cardiothoracic Vascular Surgery) | Admitting: Thoracic Surgery (Cardiothoracic Vascular Surgery)

## 2024-01-26 DIAGNOSIS — I7121 Aneurysm of the ascending aorta, without rupture: Secondary | ICD-10-CM

## 2024-01-26 MED ORDER — IOPAMIDOL (ISOVUE-370) INJECTION 76%
100.0000 mL | Freq: Once | INTRAVENOUS | Status: AC | PRN
  Administered 2024-01-26: 100 mL via INTRAVENOUS

## 2024-01-26 NOTE — Progress Notes (Signed)
 Routine follow up on aneurysm  Hx of prostate ca 2024

## 2024-01-30 ENCOUNTER — Encounter: Payer: Self-pay | Admitting: Thoracic Surgery (Cardiothoracic Vascular Surgery)

## 2024-01-30 ENCOUNTER — Ambulatory Visit (INDEPENDENT_AMBULATORY_CARE_PROVIDER_SITE_OTHER): Payer: No Typology Code available for payment source | Admitting: Thoracic Surgery (Cardiothoracic Vascular Surgery)

## 2024-01-30 VITALS — BP 130/83 | HR 76 | Resp 18 | Ht 73.0 in | Wt 202.0 lb

## 2024-01-30 DIAGNOSIS — I7121 Aneurysm of the ascending aorta, without rupture: Secondary | ICD-10-CM

## 2024-01-30 NOTE — Progress Notes (Signed)
 [address]       Sean [name] [address]             [phone]      HPI: Sean [name] is returns for a scheduled follow-up visit  Sean [name] is a 68 year old man with a history of an ascending aneurysm, thoracic aortic atherosclerosis, paroxysmal atrial fibrillation, prostate cancer, and melanoma.  He also has a family history of ascending aneurysms.  He was first noted to have a dilated aortic root on echocardiogram.  CT angiogram in November 2022 showed a 4.6 cm aneurysm.  He has been followed since then.  In the interim since last visit he has been feeling well.  No chest pain, pressure, tightness, or shortness of breath.  Completed treatment for prostate cancer.  Rarely smokes a cigar.  Past Medical History:  Diagnosis Date   Ascending aortic aneurysm [facility])    Atrial fibrillation (HCC)    Dyslipidemia 09/01/2008   Qualifier: Diagnosis of   By: Lovell Sheehan MD, Balinda Quails        History of melanoma 02/08/2023   Malignant neoplasm of prostate (HCC) 06/27/2023    Current Outpatient Medications  Medication Sig Dispense Refill   diltiazem (CARDIZEM CD) 120 MG 24 hr capsule Take 1 capsule (120 mg total) by mouth daily. 30 capsule 6   ELIQUIS 5 MG TABS tablet TAKE 1 TABLET BY MOUTH TWICE A DAY 180 tablet 2   metoprolol succinate (TOPROL-XL) 50 MG 24 hr tablet TAKE 1 TABLET BY MOUTH EVERY DAY WITH OR IMMEDIATELY FOLLOWING A MEAL 30 tablet 3   terbinafine (LAMISIL) 1 % cream Apply 1 Application topically in the morning.     zolpidem (AMBIEN) 10 MG tablet Take 1 tablet (10 mg total) by mouth at bedtime as needed. for sleep 30 tablet 1   No current facility-administered medications for this visit.    Physical Exam BP 130/83 (BP Location: Right Arm)   Pulse 76   Resp 18   Ht 6\' 1"  (1.854 m)   Wt 202 lb (91.6 kg)   SpO2 98%   BMI 26.65 kg/m  Well-appearing 68 year old man in no acute distress Alert and oriented x 3 with no focal deficits Lungs clear  bilaterally Cardiac regular rate and rhythm with no murmur No carotid bruits  Diagnostic Tests: CT ANGIOGRAPHY CHEST WITH CONTRAST   TECHNIQUE: Multidetector CT imaging of the chest was performed using the standard protocol during bolus administration of intravenous contrast. Multiplanar CT image reconstructions and MIPs were obtained to evaluate the vascular anatomy.   RADIATION DOSE REDUCTION: This exam was performed according to the departmental dose-optimization program which includes automated exposure control, adjustment of the mA and/or kV according to patient size and/or use of iterative reconstruction technique.   CONTRAST:  ISOVUE-370 IOPAMIDOL (ISOVUE-370) INJECTION 76%   COMPARISON:  07/25/2023   FINDINGS: Cardiovascular: Preferential opacification of the thoracic aorta. No dissection. Stable mid ascending thoracic aortic aneurysm measuring 4.5 cm in maximal transaxial diameter. Aortic atherosclerosis. Central pulmonary vasculature is within normal limits. No central pulmonary arterial filling defects. Normal heart size. No pericardial effusion.   Mediastinum/Nodes: No enlarged mediastinal, hilar, or axillary lymph nodes. Thyroid gland, trachea, and esophagus demonstrate no significant findings.   Lungs/Pleura: Lungs are clear. No pleural effusion or pneumothorax.   Upper Abdomen: No acute abnormality.   Musculoskeletal: No chest wall abnormality. No acute or significant osseous findings.   Review of the MIP images confirms the above findings.  IMPRESSION: 1. Stable ascending thoracic aortic aneurysm measuring 4.5 cm. 2. No acute findings.  Lungs are clear.   3.  Aortic Atherosclerosis (ICD10-I70.0).     Electronically Signed   By: Duanne Guess D.O.   On: 01/26/2024 12:40   I personally reviewed the CT images.  Unchanged 4.5 cm ascending aneurysm.  Aortic atherosclerosis.  Impression: Sean [name] is a 68 year old man with a history of  an ascending aneurysm, thoracic aortic atherosclerosis, paroxysmal atrial fibrillation, and melanoma.  He also has a family history of ascending aneurysms.  He was first noted to have a dilated aortic root on echocardiogram.  CT angiogram in November 2022 showed a 4.6 cm aneurysm.  He has been followed since then.  Ascending aneurysm/thoracic aortic atherosclerosis-stable dating back to November 2022.  Needs continued follow-up.  Will go a year at this time since there has been no change at all over the last 3 years.  Blood pressure well-controlled.  Paroxysmal atrial fibrillation-has been stable.  On Eliquis.  Plan: Return in 1 year with CT angio of chest. Recommended he monitor his blood pressure at home periodically.   Loreli Slot, MD [facility] [phone]

## 2024-02-10 ENCOUNTER — Other Ambulatory Visit: Payer: Self-pay | Admitting: Thoracic Surgery (Cardiothoracic Vascular Surgery)

## 2024-02-11 NOTE — Progress Notes (Deleted)
 Cardiology Office Note:  .   Date:  02/11/2024  ID:  Sean [name], DOB 1956/02/15, MRN [id number] PCP: Kristian Covey, MD  [facility] Providers Cardiologist:  Charlton Haws, MD Electrophysiologist:  Lanier Prude, MD {  History of Present Illness: .   Sean [name] is a 68 y.o. male w/PMHx of  Afib Aortic aneurysm Prostate Ca  Last saw EP, Dr. Lalla Brothers 02/15/23, maintaining SR post DCCV He was very active going to the gym No changes made Was pending shoulder surgery  Today's visit is scheduled as an annual visit  ROS:   *** AFib, burden *** symptoms *** eliquis, dose, bleeding, labs *** CM >>> needs a new echo! *** meds, volume >>> *** getting XRT/seed implant for prostate ca   Arrhythmia/AAD hx Afib found 2020 Flecainide Jan 2021 > failed to maintain SR Patient for some time declined further attempts at rhythm control  >> reduced LVEF 2023  AFib ablation 11/17/22 Aflutter >> recurrent AFib found Jan 2024 >> DCCV  Studies Reviewed: Marland Kitchen    EKG done today and reviewed by myself:  ***  01/25/14: CT chest IMPRESSION: 1. Stable ascending thoracic aortic aneurysm measuring 4.5 cm. 2. No acute findings.  Lungs are clear. 3.  Aortic Atherosclerosis (ICD10-I70.0).  11/17/2022: EPS/ablation CONCLUSIONS: 1. Successful PVI 2. Successful ablation/isolation of the posterior wall 3. Intracardiac echo reveals trivial pericardial effusion, dilated LA, left sided common ostium 4. No early apparent complications. 5. Colchicine 0.6mg  PO BID x 5 days 6. Protonix 40mg  PO daily x 45 days  10/14/22: TTE 1. In Afib with RVR during study. Left ventricular ejection fraction, by  estimation, is 30 to 35%. The left ventricle has moderately decreased  function. The left ventricle demonstrates global hypokinesis. Left  ventricular diastolic parameters are  indeterminate.   2. Right ventricular systolic function is moderately reduced. The right  ventricular size  is normal. Mildly increased right ventricular wall  thickness. There is moderately elevated pulmonary artery systolic  pressure. The estimated right ventricular  systolic pressure is 47.5 mmHg.   3. Left atrial size was moderately dilated.   4. Right atrial size was severely dilated.   5. The mitral valve is normal in structure. Mild to moderate mitral valve  regurgitation.   6. The aortic valve is tricuspid. Aortic valve regurgitation is mild. No  aortic stenosis is present.   7. Tricuspid valve regurgitation is mild to moderate.   8. Aortic dilatation noted. There is dilatation of the ascending aorta,  measuring 44 mm.   9. The inferior vena cava is dilated in size with <50% respiratory  variability, suggesting right atrial pressure of 15 mmHg.   Risk Assessment/Calculations:    Physical Exam:   VS:  There were no vitals taken for this visit.   Wt Readings from Last 3 Encounters:  01/30/24 202 lb (91.6 kg)  11/14/23 204 lb (92.5 kg)  11/15/23 204 lb (92.5 kg)    GEN: Well nourished, well developed in no acute distress NECK: No JVD; No carotid bruits CARDIAC: ***RRR, no murmurs, rubs, gallops RESPIRATORY:  *** CTA b/l without rales, wheezing or rhonchi  ABDOMEN: Soft, non-tender, non-distended EXTREMITIES:  No edema; No deformity  ASSESSMENT AND PLAN: .    persistent AFib AFlutter CHA2DS2Vasc is 3, on eliquis, *** appropriately dosed *** burden by symptoms  CM Presumed tachy-mediated *** Needs an updated echo  Ascending Ao aneurysm Stable by recent CT Follows with CTS  Secondary hypercoagulable state 2/2 AFib     {  Are you ordering a CV Procedure (e.g. stress test, cath, DCCV, TEE, etc)?   Press F2        :[id number]}     Dispo: ***  Signed, Sheilah Pigeon, PA-C

## 2024-02-12 ENCOUNTER — Ambulatory Visit: Payer: Medicare Other | Admitting: Physician Assistant

## 2024-02-13 ENCOUNTER — Ambulatory Visit (INDEPENDENT_AMBULATORY_CARE_PROVIDER_SITE_OTHER): Payer: Medicare Other | Admitting: Family Medicine

## 2024-02-13 DIAGNOSIS — Z0000 Encounter for general adult medical examination without abnormal findings: Secondary | ICD-10-CM | POA: Diagnosis not present

## 2024-02-13 NOTE — Patient Instructions (Addendum)
 I really enjoyed getting to talk with you today! I am available on Tuesdays and Thursdays for virtual visits if you have any questions or concerns, or if I can be of any further assistance.   CHECKLIST FROM ANNUAL WELLNESS VISIT:  -Follow up (please call to schedule if not scheduled after visit):   -yearly for annual wellness visit with primary care office  Here is a list of your preventive care/health maintenance measures and the plan for each if any are due:  PLAN For any measures below that may be due:  -due for your tetanus booster - please let us know once you get it so that we can update your records  Health Maintenance  Topic Date Due   DTaP/Tdap/Td (2 - Tdap) 11/28/2016   Medicare Annual Wellness (AWV)  02/08/2024   COVID-19 Vaccine (5 - Pfizer risk 2024-25 season) 06/01/2024   Colonoscopy  08/13/2028   Pneumonia Vaccine 61+ Years old  Completed   INFLUENZA VACCINE  Completed   Hepatitis C Screening  Completed   Zoster Vaccines- Shingrix  Completed   HPV VACCINES  Aged Out    -See a dentist at least yearly  -Get your eyes checked and then per your eye specialist's recommendations  -Other issues addressed today:   Alcohol: please limit alcohol intake to no more than 2 drinks per any given 24 hour period. If any difficulty with cutting back please schedule a visit for help.  -I have included below further information regarding a healthy whole foods based diet, physical activity guidelines for adults, stress management and opportunities for social connections. I hope you find this information useful.   -----------------------------------------------------------------------------------------------------------------------------------------------------------------------------------------------------------------------------------------------------------    NUTRITION: -eat real food: lots of colorful vegetables (half the plate) and fruits -5-7 servings of vegetables and  fruits per day (fresh or steamed is best), exp. 2 servings of vegetables with lunch and dinner and 2 servings of fruit per day. Berries and greens such as kale and collards are great choices.  -consume on a regular basis:  fresh fruits, fresh veggies, fish, nuts, seeds, healthy oils (such as olive oil, avocado oil), whole grains (make sure for bread/pasta/crackers/etc., that the first ingredient on label contains the word "whole"), legumes. -can eat small amounts of dairy and lean meat (no larger than the palm of your hand), but avoid processed meats such as ham, bacon, lunch meat, etc. -drink water -try to avoid fast food and pre-packaged foods, processed meat, ultra processed foods/beverages (donuts, candy, etc.) -most experts advise limiting sodium to < 2300mg  per day, should limit further is any chronic conditions such as high blood pressure, heart disease, diabetes, etc. The American Heart Association advised that < 1500mg  is is ideal -try to avoid foods/beverages that contain any ingredients with names you do not recognize  -try to avoid foods/beverages  with added sugar or sweeteners/sweets  -try to avoid sweet drinks (including diet drinks): soda, juice, Gatorade, sweet tea, power drinks, diet drinks -try to avoid white rice, white bread, pasta (unless whole grain)  EXERCISE GUIDELINES FOR ADULTS: -if you wish to increase your physical activity, do so gradually and with the approval of your doctor -STOP and seek medical care immediately if you have any chest pain, chest discomfort or trouble breathing when starting or increasing exercise  -move and stretch your body, legs, feet and arms when sitting for long periods -Physical activity guidelines for optimal health in adults: -get at least 150 minutes per week of moderate exercise (can talk, but not sing);  this is about 20-30 minutes of sustained activity 5-7 days per week or two 10-15 minute episodes of sustained activity 5-7 days per  week -do some muscle building/resistance training/strength training at least 2 days per week  -balance exercises 3+ days per week:   Stand somewhere where you have something sturdy to hold onto if you lose balance    1) lift up on toes, then back down, start with 5x per day and work up to 20x   2) stand and lift one leg straight out to the side so that foot is a few inches of the floor, start with 5x each side and work up to 20x each side   3) stand on one foot, start with 5 seconds each side and work up to 20 seconds on each side  If you need ideas or help with getting more active:  -Silver sneakers https://tools.silversneakers.com  -Walk with a Doc: http://www.duncan-williams.com/  -try to include resistance (weight lifting/strength building) and balance exercises twice per week: or the following link for ideas: http://castillo-powell.com/  BuyDucts.dk  STRESS MANAGEMENT: -can try meditating, or just sitting quietly with deep breathing while intentionally relaxing all parts of your body for 5 minutes daily -if you need further help with stress, anxiety or depression please follow up with your primary doctor or contact the wonderful folks at [facility]: [phone]  SOCIAL CONNECTIONS: -options in [place] if you wish to engage in more social and exercise related activities:  -Silver sneakers https://tools.silversneakers.com  -Walk with a Doc: http://www.duncan-williams.com/  -Check out the [facility] Active Adults 50+ section on the [place] [employer] (hiking clubs, book clubs, cards and games, chess, exercise classes, aquatic classes and much more) - see the website for details: https://www.[place].gov/departments/parks-recreation/active-adults50  -YouTube has lots of exercise videos for different ages and abilities as well  -[facility] (a variety of  indoor and outdoor inperson activities for adults). 902-294-2604. [address]  -Virtual Online Classes (a variety of topics): see seniorplanet.org or call [phone]  -consider volunteering at a school, hospice center, church, senior center or elsewhere

## 2024-02-13 NOTE — Progress Notes (Signed)
 PATIENT CHECK-IN and HEALTH RISK ASSESSMENT QUESTIONNAIRE:  -completed by phone/video for upcoming Medicare Preventive Visit  Pre-Visit Check-in: 1)Vitals (height, wt, BP, etc) - record in vitals section for visit on day of visit Request home vitals (wt, BP, etc.) and enter into vitals, THEN update Vital Signs SmartPhrase below at the top of the HPI. See below.  2)Review and Update Medications, Allergies PMH, Surgeries, Social history in Epic 3)Hospitalizations in the last year with date/reason? no  4)Review and Update Care Team (patient's specialists) in Epic 5) Complete PHQ9 in Epic  6) Complete Fall Screening in Epic 7)Review all Health Maintenance Due and order under PCP if not done.  Medicare Wellness Patient Questionnaire:  Answer theses question about your habits: How often do you have a drink containing alcohol? yes How many drinks containing alcohol do you have on a typical day when you are drinking? 2-3 drinks  How often do you have six or more drinks on one occasion? A few times, rare Have you ever smoked? Smoked occasionally in the past - cigars, now has eliminated, quit smoking cigs > 30 years ago Do you use an illicit drugs?n On average, how many days per week do you engage in moderate to strenuous exercise (like a brisk walk)? On average, how many minutes do you engage in exercise at this level? Typical breakfast: oatmeal, fruit, coffee Typical lunch: if eats out tries to do salads, Mediterranean Typical dinner: chicken and fish, veggies Typical snacks:tries to avoid snacks and sweets, does eat some peanuts  Beverages:  water, coffee  Answer theses question about your everyday activities: Can you perform most household chores?y Are you deaf or have significant trouble hearing?n Do you feel that you have a problem with memory?n Do you feel safe at home?y Last dentist visit? Goes to the dentist on a regular basis 8. Do you have any difficulty performing your everyday  activities?n Are you having any difficulty walking, taking medications on your own, and or difficulty managing daily home needs? Doing some swimming and cycling, 40 minutes daily on the weekends and tries to get another day in once a week.  Do you have difficulty walking or climbing stairs?n Do you have difficulty dressing or bathing?n Do you have difficulty doing errands alone such as visiting a doctor's office or shopping?n Do you currently have any difficulty preparing food and eating?n Do you currently have any difficulty using the toilet?n Do you have any difficulty managing your finances?n Do you have any difficulties with housekeeping of managing your housekeeping?n   Do you have Advanced Directives in place (Living Will, Healthcare Power or Attorney)?  yes   Last eye Exam and location? Goes to Dr. Shea Evans   Do you currently use prescribed or non-prescribed narcotic or opioid pain medications?no  Do you have a history or close family history of breast, ovarian, tubal or peritoneal cancer or a family member with BRCA (breast cancer susceptibility 1 and 2) gene mutations? Mother died of colon cancer.   ----------------------------------------------------------------------------------------------------------------------------------------------------------------------------------------------------------------------  Because this visit was a virtual/telehealth visit, some criteria may be missing or patient reported. Any vitals not documented were not able to be obtained and vitals that have been documented are patient reported.    MEDICARE ANNUAL PREVENTIVE CARE VISIT WITH PROVIDER (Welcome to Medicare, initial annual wellness or annual wellness exam)  Virtual Visit via Video Note  I connected with AYLAN BAYONA on 02/13/24  By a video enabled telemedicine application and verified that I am speaking with the  correct person using two identifiers.  Location patient: home Location  provider:work or home office Persons participating in the virtual visit: patient, provider  Concerns and/or follow up today:  Doing well. Stable. Followed by oncology and cardiothoracic docs.   [place] has a past medical history of Ascending aortic aneurysm [facility]), Atrial fibrillation (HCC), Dyslipidemia (09/01/2008), History of melanoma (02/08/2023), and Malignant neoplasm of prostate (HCC) (06/27/2023).    See HM section in Epic for other details of completed HM.    ROS: negative for report of fevers, unintentional weight loss, vision changes, vision loss, hearing loss or change, chest pain, sob, hemoptysis, melena, hematochezia, hematuria, falls, bleeding or bruising, thoughts of suicide or self harm, memory loss  Patient-completed extensive health risk assessment - reviewed and discussed with the patient: See Health Risk Assessment completed with patient prior to the visit either above or in recent phone note. This was reviewed in detailed with the patient today and appropriate recommendations, orders and referrals were placed as needed per Summary below and patient instructions.   Review of Medical History: -PMH, PSH, Family History and current specialty and care providers reviewed and updated and listed below   Patient Care Team: Kristian Covey, MD as PCP - General (Family Medicine) Lanier Prude, MD as PCP - Electrophysiology (Cardiology) Wendall Stade, MD as PCP - Cardiology (Cardiology) Cherlyn Cushing, RN as Oncology Nurse Navigator Liliane Shi, Dorian Furnace, MD as Consulting Physician (Urology) Margaretmary Dys, MD as Consulting Physician (Radiation Oncology) Raymondo Band as Physician Assistant (Hematology and Oncology) Maryclare Labrador, RN as Registered Nurse   Past Medical History:  Diagnosis Date   Ascending aortic aneurysm [facility])    Atrial fibrillation (HCC)    Dyslipidemia 09/01/2008   Qualifier: Diagnosis of   By: Lovell Sheehan MD, John E        History of  melanoma 02/08/2023   Malignant neoplasm of prostate (HCC) 06/27/2023    Past Surgical History:  Procedure Laterality Date   ATRIAL FIBRILLATION ABLATION N/A 11/17/2022   Procedure: ATRIAL FIBRILLATION ABLATION;  Surgeon: Lanier Prude, MD;  Location: [facility] INVASIVE CV LAB;  Service: Cardiovascular;  Laterality: N/A;   CARDIOVERSION N/A 10/01/2019   Procedure: CARDIOVERSION;  Surgeon: Elease Hashimoto Deloris Ping, MD;  Location: [facility] ENDOSCOPY;  Service: Cardiovascular;  Laterality: N/A;   CARDIOVERSION N/A 12/16/2019   Procedure: CARDIOVERSION;  Surgeon: [name], MD;  Location: [facility] ENDOSCOPY;  Service: Cardiovascular;  Laterality: N/A;   CARDIOVERSION N/A 10/27/2022   Procedure: CARDIOVERSION;  Surgeon: Jodelle Red, MD;  Location: [facility] ENDOSCOPY;  Service: Cardiovascular;  Laterality: N/A;   CARDIOVERSION N/A 01/13/2023   Procedure: CARDIOVERSION;  Surgeon: Thomasene Ripple, DO;  Location: [facility] ENDOSCOPY;  Service: Cardiovascular;  Laterality: N/A;   CATARACT EXTRACTION, BILATERAL     EYE SURGERY  07/30/2019   PROSTATE BIOPSY     RADIOACTIVE SEED IMPLANT N/A 11/03/2023   Procedure: RADIOACTIVE SEED IMPLANT/BRACHYTHERAPY IMPLANT;  Surgeon: Rene Paci, MD;  Location: [facility] ORS;  Service: Urology;  Laterality: N/A;  90 MINUTES   SPACE OAR INSTILLATION N/A 11/03/2023   Procedure: SPACE OAR INSTILLATION;  Surgeon: Rene Paci, MD;  Location: [facility] ORS;  Service: Urology;  Laterality: N/A;    Social History   Socioeconomic History   Marital status: Married    Spouse name: Not on file   Number of children: Not on file   Years of education: Not on file   Highest education level: Not on file  Occupational History  Not on file  Tobacco Use   Smoking status: Some Days    Types: Cigars   Smokeless tobacco: Never  Vaping Use   Vaping status: Never Used  Substance and Sexual Activity   Alcohol use: Yes    Alcohol/week: 4.0 - 5.0 standard drinks of alcohol     Types: 4 - 5 Cans of beer per week    Comment: occ   Drug use: No   Sexual activity: Not on file  Other Topics Concern   Not on file  Social History Narrative   Lives in [place] with wife and kids.   He is an Pensions consultant.      Social Drivers of Corporate investment banker Strain: Not on file  Food Insecurity: No Food Insecurity (06/27/2023)   Hunger Vital Sign    Worried About Running Out of Food in the Last Year: Never true    Ran Out of Food in the Last Year: Never true  Transportation Needs: No Transportation Needs (06/27/2023)   PRAPARE - Administrator, Civil Service (Medical): No    Lack of Transportation (Non-Medical): No  Physical Activity: Not on file  Stress: Not on file  Social Connections: Not on file  Intimate Partner Violence: Not At Risk (06/27/2023)   Humiliation, Afraid, Rape, and Kick questionnaire    Fear of Current or Ex-Partner: No    Emotionally Abused: No    Physically Abused: No    Sexually Abused: No    Family History  Problem Relation Age of Onset   Colon cancer Mother    Cancer Brother 28       testicular cancer    Current Outpatient Medications on File Prior to Visit  Medication Sig Dispense Refill   diltiazem (CARDIZEM CD) 120 MG 24 hr capsule Take 1 capsule (120 mg total) by mouth daily. 30 capsule 6   ELIQUIS 5 MG TABS tablet TAKE 1 TABLET BY MOUTH TWICE A DAY 180 tablet 2   metoprolol succinate (TOPROL-XL) 50 MG 24 hr tablet TAKE 1 TABLET BY MOUTH EVERY DAY WITH OR IMMEDIATELY FOLLOWING A MEAL 30 tablet 3   terbinafine (LAMISIL) 1 % cream Apply 1 Application topically in the morning.     zolpidem (AMBIEN) 10 MG tablet Take 1 tablet (10 mg total) by mouth at bedtime as needed. for sleep 30 tablet 1   No current facility-administered medications on file prior to visit.    Allergies  Allergen Reactions   Malarone [Atovaquone-Proguanil Hcl]     Upset stomach, stomach pain   Quinolones     Aortic aneurysm       Physical  Exam Vitals requested from patient and listed below if patient had equipment and was able to obtain at home for this virtual visit: There were no vitals filed for this visit. Estimated body mass index is 26.65 kg/m as calculated from the following:   Height as of 01/30/24: 6\' 1"  (1.854 m).   Weight as of 01/30/24: 202 lb (91.6 kg).  EKG (optional): deferred due to virtual visit  GENERAL: alert, oriented, no acute distress detected; full vision exam deferred due to pandemic and/or virtual encounter   HEENT: atraumatic, conjunttiva clear, no obvious abnormalities on inspection of external nose and ears  NECK: normal movements of the head and neck  LUNGS: on inspection no signs of respiratory distress, breathing rate appears normal, no obvious gross SOB, gasping or wheezing  CV: no obvious cyanosis  MS: moves all visible extremities  without noticeable abnormality  PSYCH/NEURO: pleasant and cooperative, no obvious depression or anxiety, speech and thought processing grossly intact, Cognitive function grossly intact        02/13/2024    3:29 PM 06/27/2023    3:00 PM 02/08/2023    3:13 PM 10/27/2021    7:36 AM 08/31/2018    7:37 AM  Depression screen PHQ 2/9  Decreased Interest 0 0 0 0 0  Down, Depressed, Hopeless 0 0 0 0 0  PHQ - 2 Score 0 0 0 0 0       12/16/2019    8:22 AM 03/16/2022    9:42 AM 10/27/2022   10:18 AM 11/17/2022    5:39 AM 02/13/2024    3:27 PM  Fall Risk  Falls in the past year?  0   0  Was there an injury with Fall?  0   0  Fall Risk Category Calculator  0   0  Fall Risk Category (Retired)  Low     (RETIRED) Patient Fall Risk Level Low fall risk Low fall risk Low fall risk Low fall risk   Patient at Risk for Falls Due to  No Fall Risks     Fall risk Follow up  Falls evaluation completed   Falls evaluation completed;Education provided     SUMMARY AND PLAN:  Encounter for Medicare annual wellness exam   Discussed applicable health maintenance/preventive  health measures and advised and referred or ordered per patient preferences: -can get the tetanus, diptheria and pertussis vaccine at the pharmacy, discussed recs -updated colon cancer screening recs per review of prior report and path and his GI doc recs Health Maintenance  Topic Date Due   DTaP/Tdap/Td (2 - Tdap) 11/28/2016   COVID-19 Vaccine (5 - Pfizer risk 2024-25 season) 06/01/2024   Medicare Annual Wellness (AWV)  02/12/2025   Colonoscopy  08/13/2026   Pneumonia Vaccine 79+ Years old  Completed   INFLUENZA VACCINE  Completed   Hepatitis C Screening  Completed   Zoster Vaccines- Shingrix  Completed   HPV VACCINES  Aged [employer] and counseling on the following was provided based on the above review of health and a plan/checklist for the patient, along with additional information discussed, was provided for the patient in the patient instructions :   -Provided safe balance exercises that can be done at home to improve balance and discussed exercise guidelines for adults with include balance exercises at least 3 days per week.  -Advised and counseled on a healthy lifestyle -Reviewed patient's current diet. Advised and counseled on a whole foods based healthy diet. A summary of a healthy diet was provided in the Patient Instructions.  -reviewed patient's current physical activity level and discussed exercise guidelines for adults. Provided community resources and ideas for safe exercise at home to assist in meeting exercise guideline recommendations in a safe and healthy way.  -Advise yearly dental visits at minimum and regular eye exams -Advised and counseled on alcohol safe limits, risks  Follow up: see patient instructions   Patient Instructions  I really enjoyed getting to talk with you today! I am available on Tuesdays and Thursdays for virtual visits if you have any questions or concerns, or if I can be of any further assistance.   CHECKLIST FROM ANNUAL WELLNESS  VISIT:  -Follow up (please call to schedule if not scheduled after visit):   -yearly for annual wellness visit with primary care office  Here is a list of  your preventive care/health maintenance measures and the plan for each if any are due:  PLAN For any measures below that may be due:  -due for your tetanus booster - please let us know once you get it so that we can update your records  Health Maintenance  Topic Date Due   DTaP/Tdap/Td (2 - Tdap) 11/28/2016   Medicare Annual Wellness (AWV)  02/08/2024   COVID-19 Vaccine (5 - Pfizer risk 2024-25 season) 06/01/2024   Colonoscopy  08/13/2028   Pneumonia Vaccine 1+ Years old  Completed   INFLUENZA VACCINE  Completed   Hepatitis C Screening  Completed   Zoster Vaccines- Shingrix  Completed   HPV VACCINES  Aged Out    -See a dentist at least yearly  -Get your eyes checked and then per your eye specialist's recommendations  -Other issues addressed today:   Alcohol: please limit alcohol intake to no more than 2 drinks per any given 24 hour period. If any difficulty with cutting back please schedule a visit for help.  -I have included below further information regarding a healthy whole foods based diet, physical activity guidelines for adults, stress management and opportunities for social connections. I hope you find this information useful.   -----------------------------------------------------------------------------------------------------------------------------------------------------------------------------------------------------------------------------------------------------------    NUTRITION: -eat real food: lots of colorful vegetables (half the plate) and fruits -5-7 servings of vegetables and fruits per day (fresh or steamed is best), exp. 2 servings of vegetables with lunch and dinner and 2 servings of fruit per day. Berries and greens such as kale and collards are great choices.  -consume on a regular basis:  fresh  fruits, fresh veggies, fish, nuts, seeds, healthy oils (such as olive oil, avocado oil), whole grains (make sure for bread/pasta/crackers/etc., that the first ingredient on label contains the word "whole"), legumes. -can eat small amounts of dairy and lean meat (no larger than the palm of your hand), but avoid processed meats such as ham, bacon, lunch meat, etc. -drink water -try to avoid fast food and pre-packaged foods, processed meat, ultra processed foods/beverages (donuts, candy, etc.) -most experts advise limiting sodium to < 2300mg  per day, should limit further is any chronic conditions such as high blood pressure, heart disease, diabetes, etc. The American Heart Association advised that < 1500mg  is is ideal -try to avoid foods/beverages that contain any ingredients with names you do not recognize  -try to avoid foods/beverages  with added sugar or sweeteners/sweets  -try to avoid sweet drinks (including diet drinks): soda, juice, Gatorade, sweet tea, power drinks, diet drinks -try to avoid white rice, white bread, pasta (unless whole grain)  EXERCISE GUIDELINES FOR ADULTS: -if you wish to increase your physical activity, do so gradually and with the approval of your doctor -STOP and seek medical care immediately if you have any chest pain, chest discomfort or trouble breathing when starting or increasing exercise  -move and stretch your body, legs, feet and arms when sitting for long periods -Physical activity guidelines for optimal health in adults: -get at least 150 minutes per week of moderate exercise (can talk, but not sing); this is about 20-30 minutes of sustained activity 5-7 days per week or two 10-15 minute episodes of sustained activity 5-7 days per week -do some muscle building/resistance training/strength training at least 2 days per week  -balance exercises 3+ days per week:   Stand somewhere where you have something sturdy to hold onto if you lose balance    1) lift up on  toes, then back down,  start with 5x per day and work up to 20x   2) stand and lift one leg straight out to the side so that foot is a few inches of the floor, start with 5x each side and work up to 20x each side   3) stand on one foot, start with 5 seconds each side and work up to 20 seconds on each side  If you need ideas or help with getting more active:  -Silver sneakers https://tools.silversneakers.com  -Walk with a Doc: http://www.duncan-williams.com/  -try to include resistance (weight lifting/strength building) and balance exercises twice per week: or the following link for ideas: http://castillo-powell.com/  BuyDucts.dk  STRESS MANAGEMENT: -can try meditating, or just sitting quietly with deep breathing while intentionally relaxing all parts of your body for 5 minutes daily -if you need further help with stress, anxiety or depression please follow up with your primary doctor or contact the wonderful folks at [facility]: [phone]  SOCIAL CONNECTIONS: -options in [place] if you wish to engage in more social and exercise related activities:  -Silver sneakers https://tools.silversneakers.com  -Walk with a Doc: http://www.duncan-williams.com/  -Check out the [facility] Active Adults 50+ section on the [place] of [employer] (hiking clubs, book clubs, cards and games, chess, exercise classes, aquatic classes and much more) - see the website for details: https://www.[place].gov/departments/parks-recreation/active-adults50  -YouTube has lots of exercise videos for different ages and abilities as well  -[facility] (a variety of indoor and outdoor inperson activities for adults). [phone]. [address]  -Virtual Online Classes (a variety of topics): see seniorplanet.org or call [phone]  -consider volunteering at a school, hospice center,  church, senior center or elsewhere            Terressa Koyanagi, DO

## 2024-02-14 ENCOUNTER — Telehealth: Payer: Self-pay | Admitting: Family Medicine

## 2024-02-14 NOTE — Telephone Encounter (Signed)
Pt has been sch

## 2024-02-22 ENCOUNTER — Ambulatory Visit: Payer: Medicare Other | Admitting: Family Medicine

## 2024-02-27 ENCOUNTER — Ambulatory Visit (INDEPENDENT_AMBULATORY_CARE_PROVIDER_SITE_OTHER): Admitting: Family Medicine

## 2024-02-27 ENCOUNTER — Encounter: Payer: Self-pay | Admitting: Family Medicine

## 2024-02-27 VITALS — BP 120/73 | HR 81 | Temp 98.3°F | Ht 73.0 in | Wt 199.3 lb

## 2024-02-27 DIAGNOSIS — I480 Paroxysmal atrial fibrillation: Secondary | ICD-10-CM | POA: Diagnosis not present

## 2024-02-27 DIAGNOSIS — E785 Hyperlipidemia, unspecified: Secondary | ICD-10-CM | POA: Diagnosis not present

## 2024-02-27 MED ORDER — METOPROLOL SUCCINATE ER 50 MG PO TB24: 50.0000 mg | ORAL_TABLET | Freq: Every day | ORAL | 3 refills | Status: AC

## 2024-02-27 MED ORDER — DILTIAZEM HCL ER COATED BEADS 120 MG PO CP24: 120.0000 mg | ORAL_CAPSULE | Freq: Every day | ORAL | 3 refills | Status: AC

## 2024-02-27 NOTE — Progress Notes (Signed)
 Established Patient Office Visit  Subjective   Patient ID: Sean [name], male    DOB: [birth year]  Age: 68 y.o. MRN: [id number]  Chief Complaint  Patient presents with   Hypertension    Follow-up on HTN and labs     HPI   History Sean [name] is seen for annual follow-up.  He had recent Medicare wellness visit.  He was seen here a year ago and had elevated PSA and was referred to urology and was diagnosed with prostate cancer.  He had radiation seed implants in December and has done well since that time.  He also had been referred back to gastroenterologist and had multiple polyps removed during colonoscopy.  He has follow-up scheduled for 2027.  Generally doing well.  He does have a chronic atrial fibrillation.  Prior ablation but unfortunately back in A-fib.  Currently maintained on Eliquis, metoprolol, and Cardizem.  He sees cardiology regularly.  He does have history of melanoma and is followed by dermatology.  No history of diabetes.  He had CBC and basic metabolic panel back in December at the time his radiation seed implants.  Denies any recent dizziness or chest pains.  Compliant with medications.  Does need refills on metoprolol and Cardizem.  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  No plans for retiring anytime soon.  Enjoys international travel and last year went to [place]  Past Medical History:  Diagnosis Date   Ascending aortic aneurysm [facility])    Atrial fibrillation (HCC)    Dyslipidemia 09/01/2008   Qualifier: Diagnosis of   By: Lovell Sheehan MD, Balinda Quails        History of melanoma 02/08/2023   Malignant neoplasm of prostate (HCC) 06/27/2023   Past Surgical History:  Procedure Laterality Date   ATRIAL FIBRILLATION ABLATION N/A 11/17/2022   Procedure: ATRIAL FIBRILLATION ABLATION;  Surgeon: Lanier Prude, MD;  [facility] INVASIVE CV LAB;  Service: Cardiovascular;  Laterality: N/A;   CARDIOVERSION N/A 10/01/2019   Procedure: CARDIOVERSION;  Surgeon: Elease Hashimoto  Deloris Ping, MD;  Location: [facility] ENDOSCOPY;  Service: Cardiovascular;  Laterality: N/A;   CARDIOVERSION N/A 12/16/2019   Procedure: CARDIOVERSION;  Surgeon: [name], MD;  Location: [facility] ENDOSCOPY;  Service: Cardiovascular;  Laterality: N/A;   CARDIOVERSION N/A 10/27/2022   Procedure: CARDIOVERSION;  Surgeon: Jodelle Red, MD;  Location: [facility] ENDOSCOPY;  Service: Cardiovascular;  Laterality: N/A;   CARDIOVERSION N/A 01/13/2023   Procedure: CARDIOVERSION;  Surgeon: Thomasene Ripple, DO;  Location: [facility] ENDOSCOPY;  Service: Cardiovascular;  Laterality: N/A;   CATARACT EXTRACTION, BILATERAL     EYE SURGERY  07/30/2019   PROSTATE BIOPSY     RADIOACTIVE SEED IMPLANT N/A 11/03/2023   Procedure: RADIOACTIVE SEED IMPLANT/BRACHYTHERAPY IMPLANT;  Surgeon: Rene Paci, MD;  Location: [facility] ORS;  Service: Urology;  Laterality: N/A;  90 MINUTES   SPACE OAR INSTILLATION N/A 11/03/2023   Procedure: SPACE OAR INSTILLATION;  Surgeon: Rene Paci, MD;  Location: [facility] ORS;  Service: Urology;  Laterality: N/A;    reports that he has been smoking cigars. He has never used smokeless tobacco. He reports current alcohol use of about 4.0 - 5.0 standard drinks of alcohol per week. He reports that he does not use drugs. family history includes Cancer (age of onset: 9) in his brother; Colon cancer in his mother. Allergies  Allergen Reactions   Malarone [Atovaquone-Proguanil Hcl]     Upset stomach, stomach pain   Quinolones     Aortic aneurysm  Review of Systems  Constitutional:  Negative for malaise/fatigue.  Eyes:  Negative for blurred vision.  Respiratory:  Negative for shortness of breath.   Cardiovascular:  Negative for chest pain.  Neurological:  Negative for dizziness, weakness and headaches.      Objective:     BP 120/73 (BP Location: Left Arm, Patient Position: Sitting, Cuff Size: Normal)   Pulse 81   Temp 98.3 F (36.8 C) (Oral)   Ht 6\' 1"  (1.854 m)   Wt 199 lb 4.8  oz (90.4 kg)   SpO2 97%   BMI 26.29 kg/m  BP Readings from Last 3 Encounters:  02/27/24 120/73  01/30/24 130/83  11/14/23 113/86   Wt Readings from Last 3 Encounters:  02/27/24 199 lb 4.8 oz (90.4 kg)  01/30/24 202 lb (91.6 kg)  11/14/23 204 lb (92.5 kg)      Physical Exam Constitutional:      General: He is not in acute distress.    Appearance: He is well-developed.  HENT:     Right Ear: External ear normal.  Eyes:     Pupils: Pupils are equal, round, and reactive to light.  Neck:     Thyroid: No thyromegaly.  Cardiovascular:     Rate and Rhythm: Normal rate.     Comments: Irregularly irregular rhythm Pulmonary:     Effort: Pulmonary effort is normal. No respiratory distress.     Breath sounds: Normal breath sounds. No wheezing or rales.  Musculoskeletal:     Cervical back: Neck supple.  Neurological:     Mental Status: He is alert and oriented to person, place, and time.      No results found for any visits on 02/27/24.  Last CBC Lab Results  Component Value Date   WBC 7.0 11/02/2023   HGB 15.1 11/02/2023   HCT 43.4 11/02/2023   MCV 97.7 11/02/2023   MCH 34.0 11/02/2023   RDW 12.7 11/02/2023   PLT 190 11/02/2023   Last metabolic panel Lab Results  Component Value Date   GLUCOSE 109 (H) 11/02/2023   NA 136 11/02/2023   K 4.0 11/02/2023   CL 107 11/02/2023   CO2 21 (L) 11/02/2023   BUN 18 11/02/2023   CREATININE 0.84 11/02/2023   GFRNONAA >60 11/02/2023   CALCIUM 9.2 11/02/2023   PROT 6.5 02/17/2023   ALBUMIN 4.2 02/17/2023   BILITOT 0.8 02/17/2023   ALKPHOS 57 02/17/2023   AST 25 02/17/2023   ALT 28 02/17/2023   ANIONGAP 8 11/02/2023   Last lipids Lab Results  Component Value Date   CHOL 168 02/17/2023   HDL 51.10 02/17/2023   LDLCALC 104 (H) 02/17/2023   LDLDIRECT 137.4 10/15/2010   TRIG 67.0 02/17/2023   CHOLHDL 3 02/17/2023   Last hemoglobin A1c No results found for: "HGBA1C" Last thyroid functions Lab Results  Component Value  Date   TSH 2.75 10/27/2021      The 10-year ASCVD risk score (Arnett DK, et al., 2019) is: 14.6%    Assessment & Plan:   Problem List Items Addressed This Visit       Unprioritized   PAF (paroxysmal atrial fibrillation) (HCC)   Relevant Medications   metoprolol succinate (TOPROL-XL) 50 MG 24 hr tablet   diltiazem (CARDIZEM CD) 120 MG 24 hr capsule   Dyslipidemia - Primary  He is nonfasting today.  We decided not to do lipids today.  Consider fasting lipids with next follow-up  Refilled his metoprolol and diltiazem.  Continue close follow-up  with cardiology.  He plans to get follow-up PSAs through urology  No follow-ups on file.    Evelena Peat, MD

## 2024-02-29 NOTE — Progress Notes (Unsigned)
  Electrophysiology Office Note:   Date:  03/01/2024  ID:  Sean [name], DOB [birth year], MRN [id number]  Primary Cardiologist: Charlton Haws, MD Electrophysiologist: Lanier Prude, MD      History of Present Illness:   Sean [name] is a 68 y.o. male with h/o PAF s/p ablation, AFL seen today for routine electrophysiology followup.   Since last being seen in our clinic the patient reports doing OK. He has noted more fatigue and was aware that he has been back in AF.  Feels like it was "shortly after" his cardioversion last year.  Otherwise, he denies chest pain, palpitations, dyspnea, PND, orthopnea, nausea, vomiting, dizziness, syncope, edema, weight gain, or early satiety.  He would prefer to avoid long term exposure to AAD, and is willing to consider re-do ablation, especially with the advent of Pulse Field.   Review of systems complete and found to be negative unless listed in HPI.   EP Information / Studies Reviewed:    EKG is ordered today. Personal review as below.  EKG Interpretation Date/Time:  Friday March 01 2024 09:09:47 EDT Ventricular Rate:  67 PR Interval:    QRS Duration:  104 QT Interval:  418 QTC Calculation: 441 R Axis:   56  Text Interpretation: Atrial fibrillation Incomplete right bundle branch block When compared with ECG of 24-Jan-2023 15:04, Atrial fibrillation has replaced Sinus rhythm Confirmed by Maxine Glenn [phone]) on 03/01/2024 10:07:06 AM    Arrhythmia/Device History S/p PVI with posterior wall ablation 10/2022   Physical Exam:   VS:  BP 118/88 (BP Location: Left Arm, Patient Position: Sitting, Cuff Size: Normal)   Pulse 87   Resp 16   Ht 6\' 1"  (1.854 m)   Wt 199 lb 9.6 oz (90.5 kg)   SpO2 98%   BMI 26.33 kg/m    Wt Readings from Last 3 Encounters:  03/01/24 199 lb 9.6 oz (90.5 kg)  02/27/24 199 lb 4.8 oz (90.4 kg)  01/30/24 202 lb (91.6 kg)     GEN: No acute distress NECK: No JVD; No carotid bruits CARDIAC: Irregularly irregular  rate and rhythm, no murmurs, rubs, gallops RESPIRATORY:  Clear to auscultation without rales, wheezing or rhonchi  ABDOMEN: Soft, non-tender, non-distended EXTREMITIES:  No edema; No deformity   ASSESSMENT AND PLAN:    Persistent AF Atrial flutter S/p ablation 10/2022 EKG today shows AF Continue Eliquis 5 mg BID for CHA2DS2/VASc of at least 1.  Will update Echo for completeness.  Long discussion today for options. Given his young age and excellent functional status, would prefer to avoid long term AAD and will plan for re-do ablation with Dr. Lalla Brothers using Pulse Murphy Oil. Reviewed risks and benefits of the procedure today, and pt is agreeable to proceed.  Discussed personally with Dr. Lalla Brothers who is also amenable to proceed without additional office visit, given their previous discussions concerning re-do ablation.   Secondary hypercoagulable state Pt on Eliquis as above   Stable ascending thoracic aorta CTA 01/26/2024 @ 4.5 cm. Followed by TCTS chronically.    Follow up with Dr. Lalla Brothers as usual post procedure  Signed, Graciella Freer, PA-C

## 2024-03-01 ENCOUNTER — Ambulatory Visit: Attending: Internal Medicine | Admitting: Student

## 2024-03-01 ENCOUNTER — Encounter: Payer: Self-pay | Admitting: Student

## 2024-03-01 ENCOUNTER — Telehealth: Payer: Self-pay

## 2024-03-01 VITALS — BP 118/88 | HR 87 | Resp 16 | Ht 73.0 in | Wt 199.6 lb

## 2024-03-01 DIAGNOSIS — I4819 Other persistent atrial fibrillation: Secondary | ICD-10-CM | POA: Insufficient documentation

## 2024-03-01 DIAGNOSIS — D6869 Other thrombophilia: Secondary | ICD-10-CM | POA: Diagnosis present

## 2024-03-01 DIAGNOSIS — I4892 Unspecified atrial flutter: Secondary | ICD-10-CM | POA: Diagnosis present

## 2024-03-01 NOTE — Patient Instructions (Addendum)
 Medication Instructions:  Your physician recommends that you continue on your current medications as directed. Please refer to the Current Medication list given to you today.  *If you need a refill on your cardiac medications before your next appointment, please call your pharmacy*  Lab Work: BMET, CBC-within 30 days of procedure If you have labs (blood work) drawn today and your tests are completely normal, you will receive your results only by: MyChart Message (if you have MyChart) OR A paper copy in the mail If you have any lab test that is abnormal or we need to change your treatment, we will call you to review the results.  Testing/Procedures: 1.See ablation instruction letter  2.Your physician has requested that you have an echocardiogram. Echocardiography is a painless test that uses sound waves to create images of your heart. It provides your doctor with information about the size and shape of your heart and how well your heart's chambers and valves are working. This procedure takes approximately one hour. There are no restrictions for this procedure. Please do NOT wear cologne, perfume, aftershave, or lotions (deodorant is allowed). Please arrive 15 minutes prior to your appointment time.  Please note: We ask at that you not bring children with you during ultrasound (echo/ vascular) testing. Due to room size and safety concerns, children are not allowed in the ultrasound rooms during exams. Our front office staff cannot provide observation of children in our lobby area while testing is being conducted. An adult accompanying a patient to their appointment will only be allowed in the ultrasound room at the discretion of the ultrasound technician under special circumstances. We apologize for any inconvenience.   Follow-Up: At [facility], you and your health needs are our priority.  As part of our continuing mission to provide you with exceptional heart care, our providers are  all part of one team.  This team includes your primary Cardiologist (physician) and Advanced Practice Providers or APPs (Physician Assistants and Nurse Practitioners) who all work together to provide you with the care you need, when you need it.  Your next appointment:   Follow up will be arranged for you and print out on your discharge summary after your ablation.    1st Floor: - Lobby - Registration  - Pharmacy  - Lab - Cafe  2nd Floor: - PV Lab - Diagnostic Testing (echo, CT, nuclear med)  3rd Floor: - Vacant  4th Floor: - TCTS (cardiothoracic surgery) - AFib Clinic - Structural Heart Clinic - Vascular Surgery  - Vascular Ultrasound  5th Floor: - HeartCare Cardiology (general and EP) - Clinical Pharmacy for coumadin, hypertension, lipid, weight-loss medications, and med management appointments    Valet parking services will be available as well.

## 2024-03-01 NOTE — Telephone Encounter (Signed)
 Spoke with pt while he was here for o/v with Otilio Saber and scheduled his Afib Ablation with Dr. Lalla Brothers on 7/18 at 7:30 am.   He is aware that someone will be in contact with him from Imaging to schedule his CT.   I will be in contact with him when his procedure date is closer to go over all Instructions again.

## 2024-04-01 ENCOUNTER — Ambulatory Visit (HOSPITAL_COMMUNITY)

## 2024-04-01 DIAGNOSIS — I4819 Other persistent atrial fibrillation: Secondary | ICD-10-CM | POA: Insufficient documentation

## 2024-04-01 LAB — ECHOCARDIOGRAM COMPLETE
Area-P 1/2: 4.83 cm2
P 1/2 time: 296 ms
S' Lateral: 2.3 cm

## 2024-04-02 ENCOUNTER — Other Ambulatory Visit: Payer: Self-pay | Admitting: *Deleted

## 2024-04-02 ENCOUNTER — Ambulatory Visit: Payer: Medicare Other | Admitting: Cardiovascular Disease

## 2024-04-02 MED ORDER — LOSARTAN POTASSIUM 25 MG PO TABS: 12.5000 mg | ORAL_TABLET | Freq: Every day | ORAL | 3 refills | Status: AC

## 2024-04-08 ENCOUNTER — Telehealth: Payer: Self-pay | Admitting: Cardiovascular Disease

## 2024-04-08 NOTE — Telephone Encounter (Signed)
 Patient says he received 3 missed calls from [facility], but no one left him a message. Please advise.

## 2024-04-08 NOTE — Telephone Encounter (Signed)
 Spoke with pt regarding the calls from [facility] that he has missed. Pt was told that there were no notes about the calls made and therefore we are unsure what he was being called about. Pt stated he would check to make sure his voicemail was not full. Pt was told that we would call him if we need to get in contact. Pt verbalized understanding. All questions if any were answered.

## 2024-04-09 ENCOUNTER — Ambulatory Visit: Payer: Self-pay | Admitting: Student

## 2024-04-19 NOTE — Progress Notes (Signed)
 CARDIOLOGY OFFICE NOTE  Date:  05/03/2024    Sean [name] Date of Birth: Nov 23, 1956 Medical Record #[id number]  PCP:  Marquetta Sit, MD  Cardiologist:  Ilsa Maltese chief complaint on file.   History of Present Illness: Sean [name] is a 68 y.o. male first seen 08/28/19 for new onset PAF   CHADSVASC of 2. Recurrent Intolerant to flecainide . Now severe LAE on TTE Pending repeat ablation with Dr Marjean Sierras 10/21/19 non ischemia EF not accurate due to afib and normal by echo   He is still able to ride bike 1.5 hours on [address] Traveling to [place]/ [place] may do some scuba diving  Was supposed to have an ablation with Dr Marven Slimmer but developed a melanoma that needed resection Ablation rescheduled  PV CTA pending Aorta stable 4.5 cm but mod to severe AR by TTE 03/2024.   Still practicing Civil Law Active Does not notice afib No bleeding issues   Going to [place]  to fish and has a diving trip to [place] planned  Past Medical History:  Diagnosis Date   Ascending aortic aneurysm (HCC)    Atrial fibrillation (HCC)    Dyslipidemia 09/01/2008   Qualifier: Diagnosis of   By: Larrie Po MD, John E        History of melanoma 02/08/2023   Malignant neoplasm of prostate (HCC) 06/27/2023    Past Surgical History:  Procedure Laterality Date   ATRIAL FIBRILLATION ABLATION N/A 11/17/2022   Procedure: ATRIAL FIBRILLATION ABLATION;  Surgeon: Boyce Byes, MD;  Location: [facility] INVASIVE CV LAB;  Service: Cardiovascular;  Laterality: N/A;   CARDIOVERSION N/A 10/01/2019   Procedure: CARDIOVERSION;  Surgeon: Alroy Aspen Lela Purple, MD;  Location: [facility] ENDOSCOPY;  Service: Cardiovascular;  Laterality: N/A;   CARDIOVERSION N/A 12/16/2019   Procedure: CARDIOVERSION;  Surgeon: Euell Herrlich, MD;  Location: [facility] ENDOSCOPY;  Service: Cardiovascular;  Laterality: N/A;   CARDIOVERSION N/A 10/27/2022   Procedure: CARDIOVERSION;  Surgeon: Sheryle Donning, MD;  Location: [facility] ENDOSCOPY;   Service: Cardiovascular;  Laterality: N/A;   CARDIOVERSION N/A 01/13/2023   Procedure: CARDIOVERSION;  Surgeon: Jerryl Morin, DO;  Location: [facility] ENDOSCOPY;  Service: Cardiovascular;  Laterality: N/A;   CATARACT EXTRACTION, BILATERAL     EYE SURGERY  07/30/2019   PROSTATE BIOPSY     RADIOACTIVE SEED IMPLANT N/A 11/03/2023   Procedure: RADIOACTIVE SEED IMPLANT/BRACHYTHERAPY IMPLANT;  Surgeon: Adelbert Homans, MD;  Location: [facility] ORS;  Service: Urology;  Laterality: N/A;  90 MINUTES   SPACE OAR INSTILLATION N/A 11/03/2023   Procedure: SPACE OAR INSTILLATION;  Surgeon: Adelbert Homans, MD;  Location: [facility] ORS;  Service: Urology;  Laterality: N/A;     Medications: Current Meds  Medication Sig   diltiazem  (CARDIZEM  CD) 120 MG 24 hr capsule Take 1 capsule (120 mg total) by mouth daily.   ELIQUIS  5 MG TABS tablet TAKE 1 TABLET BY MOUTH TWICE A DAY   losartan  (COZAAR ) 25 MG tablet Take 0.5 tablets (12.5 mg total) by mouth daily.   metoprolol  succinate (TOPROL -XL) 50 MG 24 hr tablet Take 1 tablet (50 mg total) by mouth daily. Take with or immediately following a meal.   terbinafine  (LAMISIL ) 1 % cream Apply 1 Application topically in the morning.   zolpidem  (AMBIEN ) 10 MG tablet Take 1 tablet (10 mg total) by mouth at bedtime as needed. for sleep     Allergies: Allergies  Allergen Reactions   Malarone  [Atovaquone -Proguanil Hcl]     Upset stomach,  stomach pain   Quinolones     Aortic aneurysm    Social History: The patient  reports that he has been smoking cigars. He has never used smokeless tobacco. He reports current alcohol use of about 4.0 - 5.0 standard drinks of alcohol per week. He reports that he does not use drugs.   Family History: The patient's family history includes Cancer (age of onset: 17) in his brother; Colon cancer in his mother. His father died at 38 - had sudden death - no autopsy. Mother died at 42 with colon cancer.   Review of Systems: Please see the  history of present illness.   All other systems are reviewed and negative.   Physical Exam: VS:  BP 114/70   Pulse 85   Ht 6\' 1"  (1.854 m)   Wt 195 lb (88.5 kg)   SpO2 97%   BMI 25.73 kg/m  .  BMI Body mass index is 25.73 kg/m.  Wt Readings from Last 3 Encounters:  05/03/24 195 lb (88.5 kg)  03/01/24 199 lb 9.6 oz (90.5 kg)  02/27/24 199 lb 4.8 oz (90.4 kg)    Affect appropriate Healthy:  appears stated age HEENT: normal Neck supple with no adenopathy JVP normal no bruits no thyromegaly Lungs clear with no wheezing and good diaphragmatic motion Heart:  S1/S2 AR  murmur, no rub, gallop or click PMI normal Abdomen: benighn, BS positve, no tenderness, no AAA no bruit.  No HSM or HJR Distal pulses intact with no bruits No edema Neuro non-focal Skin warm and dry No muscular weakness    LABORATORY DATA:  EKG:  12/24/19 afibr/flutter rate 92 otherwise normal 05/03/2024 Afib rate 95 normal otherwise  Lab Results  Component Value Date   WBC 7.0 11/02/2023   HGB 15.1 11/02/2023   HCT 43.4 11/02/2023   PLT 190 11/02/2023   GLUCOSE 109 (H) 11/02/2023   CHOL 168 02/17/2023   TRIG 67.0 02/17/2023   HDL 51.10 02/17/2023   LDLDIRECT 137.4 10/15/2010   LDLCALC 104 (H) 02/17/2023   ALT 28 02/17/2023   AST 25 02/17/2023   NA 136 11/02/2023   K 4.0 11/02/2023   CL 107 11/02/2023   CREATININE 0.84 11/02/2023   BUN 18 11/02/2023   CO2 21 (L) 11/02/2023   TSH 2.75 10/27/2021   PSA 4.97 (H) 02/17/2023   INR 1.1 09/25/2019     BNP (last 3 results) No results for input(s): "BNP" in the last 8760 hours.  ProBNP (last 3 results) No results for input(s): "PROBNP" in the last 8760 hours.   Other Studies Reviewed Today:  ECHO IMPRESSIONS 04/01/24   1. LVEF is 62%, increased from 30-35% 09/2022. Left ventricular ejection  fraction, by estimation, is 60 to 65%. Left ventricular ejection fraction  by 3D volume is 62 %. The left ventricle has normal function. The left   ventricle has no regional wall  motion abnormalities. There is mild left ventricular hypertrophy of the  lateral segment. Left ventricular diastolic function could not be  evaluated.   2. Right ventricular systolic function is normal. The right ventricular  size is moderately enlarged. There is moderately elevated pulmonary artery  systolic pressure.   3. Left atrial size was severely dilated.   4. Right atrial size was severely dilated.   5. The mitral valve is normal in structure. Mild mitral valve  regurgitation. No evidence of mitral stenosis.   6. The aortic valve is tricuspid. Aortic valve regurgitation is moderate  to severe. No  aortic stenosis is present. Aortic regurgitation PHT  measures 296 msec.   7. Aortic dilatation noted. There is moderate dilatation of the ascending  aorta, measuring 45 mm.   8. The inferior vena cava is dilated in size with <50% respiratory  variability, suggesting right atrial pressure of 15 mmHg.    Assessment/Plan:  1. Atrial fib - persistent - severe LAE - successful [facility] 10/01/19 CHADVASC 2 ERA Repeat DCC done 12/16/19 on flecainide  with again early failure Seen by Dr Nunzio Belch 03/02/20 deferred ablation Flecainide  caused severe headache Start Toprol  25 mg daily for rate control strategy Ablation with Dr Marven Slimmer December 10/2022 Back in afib when seen by Afib clinic 03/01/24 likely for a while with fatigue. Plan on repeat ablation with Dr Marven Slimmer this time using Pulse Murphy Oil. CTA PV;s pending  Rate control with cardizem  and Toprol    2. Anticoagulation -  No bleeding issues on eliquis   Despite CHADVASC 2 Stay on Eliquis    3. 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   4. Dilated aorta -  4.5 cm by CTA February 15, 2024 His father died with sudden death - this a presumed MI. Precautions were discussed (Bp control, no Valsalva, no Fluoroquinolones).     5. Moderate to severe AI - by TTE 04/01/24 compensated LV LVIDd 47 mm LVIDs 23 mm.    6.  HTN:  Well controlled continue ARB, lopressor  and cardizem    F/U in 6 months  Refer EP Marven Slimmer ? Ablation  F/U CVTS [facility] for CTA/MRA imaging of aorta   Signed: Janelle Mediate, MD  05/03/2024 8:27 AM  [facility] [address] [place], [place]  [address] Phone: [phone] Fax: [phone]

## 2024-05-03 ENCOUNTER — Encounter: Payer: Self-pay | Admitting: Cardiovascular Disease

## 2024-05-03 ENCOUNTER — Ambulatory Visit: Payer: Medicare Other | Attending: Cardiovascular Disease | Admitting: Cardiovascular Disease

## 2024-05-03 VITALS — BP 114/70 | HR 85 | Ht 73.0 in | Wt 195.0 lb

## 2024-05-03 DIAGNOSIS — I480 Paroxysmal atrial fibrillation: Secondary | ICD-10-CM | POA: Diagnosis present

## 2024-05-03 DIAGNOSIS — I351 Nonrheumatic aortic (valve) insufficiency: Secondary | ICD-10-CM | POA: Insufficient documentation

## 2024-05-03 DIAGNOSIS — I77810 Thoracic aortic ectasia: Secondary | ICD-10-CM | POA: Diagnosis not present

## 2024-05-03 NOTE — Patient Instructions (Signed)
 Medication Instructions:  Your physician recommends that you continue on your current medications as directed. Please refer to the Current Medication list given to you today.  *If you need a refill on your cardiac medications before your next appointment, please call your pharmacy*  Follow-Up: At [facility], you and your health needs are our priority.  As part of our continuing mission to provide you with exceptional heart care, our providers are all part of one team.  This team includes your primary Cardiologist (physician) and Advanced Practice Providers or APPs (Physician Assistants and Nurse Practitioners) who all work together to provide you with the care you need, when you need it.  Your next appointment:   1 year(s)  Provider:   Janelle Mediate, MD  We recommend signing up for the patient portal called "MyChart".  Sign up information is provided on this After Visit Summary.  MyChart is used to connect with patients for Virtual Visits (Telemedicine).  Patients are able to view lab/test results, encounter notes, upcoming appointments, etc.  Non-urgent messages can be sent to your provider as well.   To learn more about what you can do with MyChart, go to [email or web address].

## 2024-05-24 ENCOUNTER — Ambulatory Visit (HOSPITAL_COMMUNITY)
Admission: RE | Admit: 2024-05-24 | Discharge: 2024-05-24 | Disposition: A | Discharge status: Home / Self Care | Source: Ambulatory Visit | Attending: Cardiology

## 2024-05-24 DIAGNOSIS — I4819 Other persistent atrial fibrillation: Secondary | ICD-10-CM | POA: Diagnosis present

## 2024-05-24 DIAGNOSIS — I700 Atherosclerosis of aorta: Secondary | ICD-10-CM | POA: Diagnosis not present

## 2024-05-24 DIAGNOSIS — I7121 Aneurysm of the ascending aorta, without rupture: Secondary | ICD-10-CM | POA: Insufficient documentation

## 2024-05-24 MED ORDER — IOHEXOL 350 MG/ML SOLN
100.0000 mL | Freq: Once | INTRAVENOUS | Status: AC | PRN
  Administered 2024-05-24: 100 mL via INTRAVENOUS

## 2024-05-24 MED ORDER — IOHEXOL 350 MG/ML SOLN
50.0000 mL | Freq: Once | INTRAVENOUS | Status: AC | PRN
  Administered 2024-05-24: 150 mL via INTRAVENOUS

## 2024-05-28 ENCOUNTER — Ambulatory Visit: Payer: Self-pay | Admitting: Cardiology

## 2024-06-06 ENCOUNTER — Telehealth (HOSPITAL_COMMUNITY): Payer: Self-pay

## 2024-06-06 NOTE — Telephone Encounter (Signed)
 Attempted to reach patient to discuss upcoming procedure, no answer. Left VM for patient to return call.

## 2024-06-06 NOTE — Telephone Encounter (Signed)
 Patient returned call to discuss upcoming procedure.   CT: completed.  Labs: to be completed on 7/11.   Any recent signs of acute illness or been started on antibiotics? No Any new medications started? No Any medications to hold? No Any missed doses of blood thinner? No Advised patient to continue taking ANTICOAGULANT: Eliquis  (Apixaban ) twice daily without missing any doses.  Medication instructions:  On the morning of your procedure DO NOT take any medication., including Eliquis  or the procedure may be rescheduled. Nothing to eat or drink after midnight prior to your procedure.  Confirmed patient is scheduled for Atrial Fibrillation Ablation on Friday, July 18 with Dr. Ole Holts. Instructed patient to arrive at the Main Entrance A at [facility]: [address] [place], [place] [address] and check in at Admitting at 5:30 AM.  Advised of plan to go home the same day and will only stay overnight if medically necessary. You MUST have a responsible adult to drive you home and MUST be with you the first 24 hours after you arrive home or your procedure could be cancelled.  Patient verbalized understanding to all instructions provided and agreed to proceed with procedure.

## 2024-06-08 LAB — CBC
Hematocrit: 44.6 % (ref 37.5–51.0)
Hemoglobin: 14.6 g/dL (ref 13.0–17.7)
MCH: 33 pg (ref 26.6–33.0)
MCHC: 32.7 g/dL (ref 31.5–35.7)
MCV: 101 fL — ABNORMAL HIGH (ref 79–97)
Platelets: 132 x10E3/uL — ABNORMAL LOW (ref 150–450)
RBC: 4.42 x10E6/uL (ref 4.14–5.80)
RDW: 12.4 % (ref 11.6–15.4)
WBC: 4.2 x10E3/uL (ref 3.4–10.8)

## 2024-06-08 LAB — BASIC METABOLIC PANEL WITH GFR
BUN/Creatinine Ratio: 14 (ref 10–24)
BUN: 16 mg/dL (ref 8–27)
CO2: 16 mmol/L — ABNORMAL LOW (ref 20–29)
Calcium: 9.3 mg/dL (ref 8.6–10.2)
Chloride: 103 mmol/L (ref 96–106)
Creatinine, Ser: 1.15 mg/dL (ref 0.76–1.27)
Glucose: 138 mg/dL — ABNORMAL HIGH (ref 70–99)
Potassium: 4.2 mmol/L (ref 3.5–5.2)
Sodium: 139 mmol/L (ref 134–144)
eGFR: 70 mL/min/1.73 (ref >59)

## 2024-06-13 NOTE — Pre-Procedure Instructions (Signed)
 Attempted to call patient regarding procedure instructions.  Left voicemail on the following items: Arrival time 0515 Nothing to eat or drink after midnight No meds AM of procedure Responsible person to drive you home and stay with you for 24 hrs  Have you missed any doses of anti-coagulant Eliquis - should be taken twice a day, if you have missed any doses please let us  know.  Don't take morning of procedure.

## 2024-06-13 NOTE — Anesthesia Preprocedure Evaluation (Addendum)
 Anesthesia Evaluation  Patient identified by MRN, date of birth, ID band Patient awake    Reviewed: Allergy & Precautions, NPO status , Patient's Chart, lab work & pertinent test results, reviewed documented beta blocker date and time   History of Anesthesia Complications Negative for: history of anesthetic complications  Airway Mallampati: II  TM Distance: >3 FB Neck ROM: Full    Dental no notable dental hx.    Pulmonary Current Smoker and Patient abstained from smoking.   Pulmonary exam normal        Cardiovascular hypertension, Pt. on medications and Pt. on home beta blockers Normal cardiovascular exam+ dysrhythmias (on Eliquis ) Atrial Fibrillation   TTE 04/01/24: LVEF is 62% (increased from 30-35% 09/2022), mild LVH, moderate RVE, moderate pHTN, severe LAE/RAE, mild MR, moderate to severe AR, moderate dilatation of ascending aorta measuring 45mm    Neuro/Psych negative neurological ROS     GI/Hepatic negative GI ROS, Neg liver ROS,,,  Endo/Other  negative endocrine ROS    Renal/GU negative Renal ROS  negative genitourinary   Musculoskeletal negative musculoskeletal ROS (+)    Abdominal   Peds  Hematology Plt 132k   Anesthesia Other Findings Day of surgery medications reviewed with patient.  Reproductive/Obstetrics negative OB ROS                              Anesthesia Physical Anesthesia Plan  ASA: 3  Anesthesia Plan: General   Post-op Pain Management: Minimal or no pain anticipated   Induction: Intravenous  PONV Risk Score and Plan: Treatment may vary due to age or medical condition, Midazolam, Dexamethasone  and Ondansetron   Airway Management Planned: Oral ETT  Additional Equipment: None  Intra-op Plan:   Post-operative Plan: Extubation in OR  Informed Consent: I have reviewed the patients History and Physical, chart, labs and discussed the procedure including the risks,  benefits and alternatives for the proposed anesthesia with the patient or authorized representative who has indicated his/her understanding and acceptance.     Dental advisory given  Plan Discussed with: CRNA  Anesthesia Plan Comments: (Previous grade 2 view with Cleotilde 2 blade)         Anesthesia Quick Evaluation

## 2024-06-14 ENCOUNTER — Ambulatory Visit (HOSPITAL_COMMUNITY)
Admission: RE | Admit: 2024-06-14 | Discharge: 2024-06-14 | Disposition: A | Discharge status: Home / Self Care | Attending: Cardiology | Admitting: Cardiology

## 2024-06-14 ENCOUNTER — Other Ambulatory Visit (HOSPITAL_COMMUNITY): Payer: Self-pay

## 2024-06-14 ENCOUNTER — Encounter (HOSPITAL_COMMUNITY): Payer: Self-pay

## 2024-06-14 ENCOUNTER — Other Ambulatory Visit: Payer: Self-pay | Admitting: Family Medicine

## 2024-06-14 ENCOUNTER — Other Ambulatory Visit: Payer: Self-pay

## 2024-06-14 ENCOUNTER — Ambulatory Visit (HOSPITAL_COMMUNITY): Payer: Self-pay

## 2024-06-14 ENCOUNTER — Ambulatory Visit (HOSPITAL_COMMUNITY)
Admission: RE | Disposition: A | Discharge status: Home / Self Care | Payer: Self-pay | Source: Home / Self Care | Attending: Cardiology

## 2024-06-14 DIAGNOSIS — I10 Essential (primary) hypertension: Secondary | ICD-10-CM | POA: Insufficient documentation

## 2024-06-14 DIAGNOSIS — Z79899 Other long term (current) drug therapy: Secondary | ICD-10-CM | POA: Diagnosis not present

## 2024-06-14 DIAGNOSIS — E785 Hyperlipidemia, unspecified: Secondary | ICD-10-CM

## 2024-06-14 DIAGNOSIS — I4819 Other persistent atrial fibrillation: Secondary | ICD-10-CM | POA: Diagnosis present

## 2024-06-14 DIAGNOSIS — F17210 Nicotine dependence, cigarettes, uncomplicated: Secondary | ICD-10-CM

## 2024-06-14 DIAGNOSIS — I483 Typical atrial flutter: Secondary | ICD-10-CM | POA: Diagnosis not present

## 2024-06-14 DIAGNOSIS — I4891 Unspecified atrial fibrillation: Secondary | ICD-10-CM | POA: Diagnosis not present

## 2024-06-14 DIAGNOSIS — Z7901 Long term (current) use of anticoagulants: Secondary | ICD-10-CM | POA: Insufficient documentation

## 2024-06-14 HISTORY — PX: ATRIAL FIBRILLATION ABLATION: EP1191

## 2024-06-14 LAB — POCT ACTIVATED CLOTTING TIME: Activated Clotting Time: 291 s

## 2024-06-14 SURGERY — ATRIAL FIBRILLATION ABLATION
Anesthesia: General

## 2024-06-14 MED ORDER — COLCHICINE 0.6 MG PO TABS
0.6000 mg | ORAL_TABLET | Freq: Two times a day (BID) | ORAL | Status: DC
  Administered 2024-06-14: 0.6 mg via ORAL
  Filled 2024-06-14 (×2): qty 1

## 2024-06-14 MED ORDER — LIDOCAINE 2% (20 MG/ML) 5 ML SYRINGE
INTRAMUSCULAR | Status: DC | PRN
  Administered 2024-06-14: 100 mg via INTRAVENOUS

## 2024-06-14 MED ORDER — HEPARIN (PORCINE) IN NACL 1000-0.9 UT/500ML-% IV SOLN
INTRAVENOUS | Status: DC | PRN
  Administered 2024-06-14 (×3): 500 mL

## 2024-06-14 MED ORDER — MIDAZOLAM HCL 2 MG/2ML IJ SOLN
INTRAMUSCULAR | Status: DC | PRN
  Administered 2024-06-14: 2 mg via INTRAVENOUS

## 2024-06-14 MED ORDER — HEPARIN SODIUM (PORCINE) 1000 UNIT/ML IJ SOLN
INTRAMUSCULAR | Status: AC
  Filled 2024-06-14: qty 10

## 2024-06-14 MED ORDER — SODIUM CHLORIDE 0.9 % IV SOLN: INTRAVENOUS | Status: DC

## 2024-06-14 MED ORDER — SODIUM CHLORIDE 0.9% FLUSH: 3.0000 mL | Freq: Two times a day (BID) | INTRAVENOUS | Status: DC

## 2024-06-14 MED ORDER — ATROPINE SULFATE 1 MG/10ML IJ SOSY
PREFILLED_SYRINGE | INTRAMUSCULAR | Status: DC | PRN
  Administered 2024-06-14: 1 mg via INTRAVENOUS

## 2024-06-14 MED ORDER — SODIUM CHLORIDE 0.9% FLUSH
3.0000 mL | INTRAVENOUS | Status: DC | PRN
Start: 2024-06-14 — End: 2024-06-14

## 2024-06-14 MED ORDER — ROCURONIUM BROMIDE 10 MG/ML (PF) SYRINGE
PREFILLED_SYRINGE | INTRAVENOUS | Status: DC | PRN
  Administered 2024-06-14: 60 mg via INTRAVENOUS
  Administered 2024-06-14: 5 mg via INTRAVENOUS
  Administered 2024-06-14: 10 mg via INTRAVENOUS
  Administered 2024-06-14 (×3): 5 mg via INTRAVENOUS

## 2024-06-14 MED ORDER — ACETAMINOPHEN 325 MG PO TABS: 650.0000 mg | ORAL_TABLET | ORAL | Status: DC | PRN

## 2024-06-14 MED ORDER — ATROPINE SULFATE 1 MG/10ML IJ SOSY
PREFILLED_SYRINGE | INTRAMUSCULAR | Status: AC
Start: 2024-06-14 — End: 2024-06-14
  Filled 2024-06-14: qty 20

## 2024-06-14 MED ORDER — PANTOPRAZOLE SODIUM 40 MG PO TBEC
40.0000 mg | DELAYED_RELEASE_TABLET | Freq: Every day | ORAL | 0 refills | Status: AC
  Filled 2024-06-14: qty 45, 45d supply, fill #0

## 2024-06-14 MED ORDER — SUGAMMADEX SODIUM 200 MG/2ML IV SOLN
INTRAVENOUS | Status: DC | PRN
  Administered 2024-06-14: 200 mg via INTRAVENOUS

## 2024-06-14 MED ORDER — FENTANYL CITRATE (PF) 100 MCG/2ML IJ SOLN
INTRAMUSCULAR | Status: AC
  Filled 2024-06-14: qty 2

## 2024-06-14 MED ORDER — PHENYLEPHRINE HCL-NACL 20-0.9 MG/250ML-% IV SOLN
INTRAVENOUS | Status: DC | PRN
  Administered 2024-06-14: 30 ug/min via INTRAVENOUS

## 2024-06-14 MED ORDER — ONDANSETRON HCL 4 MG/2ML IJ SOLN
INTRAMUSCULAR | Status: DC | PRN
  Administered 2024-06-14: 4 mg via INTRAVENOUS

## 2024-06-14 MED ORDER — APIXABAN 5 MG PO TABS
5.0000 mg | ORAL_TABLET | Freq: Two times a day (BID) | ORAL | Status: DC
  Administered 2024-06-14: 5 mg via ORAL
  Filled 2024-06-14: qty 1

## 2024-06-14 MED ORDER — SODIUM CHLORIDE 0.9 % IV SOLN: 250.0000 mL | INTRAVENOUS | Status: DC | PRN

## 2024-06-14 MED ORDER — COLCHICINE 0.6 MG PO TABS
0.6000 mg | ORAL_TABLET | Freq: Two times a day (BID) | ORAL | 0 refills | Status: DC
  Filled 2024-06-14: qty 10, 5d supply, fill #0

## 2024-06-14 MED ORDER — MIDAZOLAM HCL 2 MG/2ML IJ SOLN
INTRAMUSCULAR | Status: AC
  Filled 2024-06-14: qty 2

## 2024-06-14 MED ORDER — DEXAMETHASONE SODIUM PHOSPHATE 10 MG/ML IJ SOLN
INTRAMUSCULAR | Status: DC | PRN
  Administered 2024-06-14: 5 mg via INTRAVENOUS

## 2024-06-14 MED ORDER — FENTANYL CITRATE (PF) 250 MCG/5ML IJ SOLN
INTRAMUSCULAR | Status: DC | PRN
  Administered 2024-06-14: 50 ug via INTRAVENOUS

## 2024-06-14 MED ORDER — PROPOFOL 10 MG/ML IV BOLUS
INTRAVENOUS | Status: DC | PRN
  Administered 2024-06-14: 40 mg via INTRAVENOUS
  Administered 2024-06-14: 160 mg via INTRAVENOUS

## 2024-06-14 MED ORDER — PROTAMINE SULFATE 10 MG/ML IV SOLN
INTRAVENOUS | Status: DC | PRN
  Administered 2024-06-14: 35 mg via INTRAVENOUS

## 2024-06-14 MED ORDER — HEPARIN SODIUM (PORCINE) 1000 UNIT/ML IJ SOLN
INTRAMUSCULAR | Status: DC | PRN
  Administered 2024-06-14: 14000 [IU] via INTRAVENOUS
  Administered 2024-06-14: 5000 [IU] via INTRAVENOUS

## 2024-06-14 MED ORDER — PANTOPRAZOLE SODIUM 40 MG PO TBEC
40.0000 mg | DELAYED_RELEASE_TABLET | Freq: Every day | ORAL | Status: DC
  Administered 2024-06-14: 40 mg via ORAL
  Filled 2024-06-14 (×2): qty 1

## 2024-06-14 MED ORDER — ONDANSETRON HCL 4 MG/2ML IJ SOLN: 4.0000 mg | Freq: Four times a day (QID) | INTRAMUSCULAR | Status: DC | PRN

## 2024-06-14 SURGICAL SUPPLY — 21 items
BAG SNAP BAND KOVER 36X36 (MISCELLANEOUS) IMPLANT
CABLE FARASTAR GEN2 SNGL USE (CABLE) IMPLANT
CATH ABLAT QDOT MICRO BI TC FJ (CATHETERS) IMPLANT
CATH FARAWAVE 2.0 31 (CATHETERS) IMPLANT
CATH GE 8FR SOUNDSTAR (CATHETERS) IMPLANT
CATH OCTARAY 2.0 F 3-3-3-3-3 (CATHETERS) IMPLANT
CATH WEB BI DIR CSDF CRV REPRO (CATHETERS) IMPLANT
CLOSURE PERCLOSE PROSTYLE (VASCULAR PRODUCTS) IMPLANT
COVER SWIFTLINK CONNECTOR (BAG) ×1 IMPLANT
DILATOR VESSEL 38 20CM 16FR (INTRODUCER) IMPLANT
GUIDEWIRE INQWIRE 1.5J.035X260 (WIRE) IMPLANT
KIT VERSACROSS CNCT FARADRIVE (KITS) IMPLANT
MAT PREVALON FULL STRYKER (MISCELLANEOUS) IMPLANT
PACK EP LF (CUSTOM PROCEDURE TRAY) ×1 IMPLANT
PAD DEFIB RADIO PHYSIO CONN (PAD) ×1 IMPLANT
PATCH CARTO3 (PAD) IMPLANT
SHEATH FARADRIVE STEERABLE (SHEATH) IMPLANT
SHEATH PINNACLE 8F 10CM (SHEATH) IMPLANT
SHEATH PINNACLE 9F 10CM (SHEATH) IMPLANT
SHEATH PROBE COVER 6X72 (BAG) IMPLANT
TUBING SMART ABLATE COOLFLOW (TUBING) IMPLANT

## 2024-06-14 NOTE — Telephone Encounter (Signed)
 Copied from CRM [id number]. Topic: Clinical - Medication Refill >> Jun 14, 2024  4:01 PM Melissa C wrote: Medication: zolpidem  (AMBIEN ) 10 MG tablet   Has the patient contacted their pharmacy? No (Agent: If no, request that the patient contact the pharmacy for the refill. If patient does not wish to contact the pharmacy document the reason why and proceed with request.) (Agent: If yes, when and what did the pharmacy advise?)  This is the patient's preferred pharmacy:  [id number] [place], [place] - [address] [place] [address] Phone: [phone] Fax: [phone]  Is this the correct pharmacy for this prescription? Yes If no, delete pharmacy and type the correct one.   Has the prescription been filled recently? No  Is the patient out of the medication? No  Has the patient been seen for an appointment in the last year OR does the patient have an upcoming appointment? Yes  Can we respond through MyChart? No  Agent: Please be advised that Rx refills may take up to 3 business days. We ask that you follow-up with your pharmacy.

## 2024-06-14 NOTE — Transfer of Care (Signed)
 Immediate Anesthesia Transfer of Care Note  Patient: Sean [name]  Procedure(s) Performed: ATRIAL FIBRILLATION ABLATION  Patient Location: PACU and Cath Lab  Anesthesia Type:General  Level of Consciousness: awake, alert , and oriented  Airway & Oxygen Therapy: Patient Spontanous Breathing  Post-op Assessment: Report given to RN and Post -op Vital signs reviewed and stable  Post vital signs: Reviewed and stable  Last Vitals:  Vitals Value Taken Time  BP 102/68 06/14/24 09:25  Temp    Pulse 60 06/14/24 09:25  Resp 17 06/14/24 09:25  SpO2 95 % 06/14/24 09:25  Vitals shown include unfiled device data.  Last Pain:  Vitals:   06/14/24 0622  TempSrc: Oral  PainSc: 0-No pain         Complications: No notable events documented.

## 2024-06-14 NOTE — H&P (Signed)
  Electrophysiology Office Follow up Visit Note:     Date:  06/14/2024    ID:  Sean [name], DOB [birth year], MRN [id number]   PCP:  Micheal Wolm ORN, MD           [facility] Cardiologist:  Maude Emmer, MD  [facility] Electrophysiologist:  OLE ONEIDA HOLTS, MD      Interval History:     Sean [name] is a 68 y.o. male who presents for a follow up visit.    He is s/p PVI 11/17/2022. During the ablation, the veins and posterior wall were ablated. Saw Arland 01/24/2023 and was maintaining sinus rhythm. He has been doing well.  He has maintained normal rhythm after his procedure.  He is staying active.  He takes his Eliquis  without bleeding issues.  Patient had recurrence of symptomatic atrial fibrillation. Presents for AF ablation today.     Objective Past medical, surgical, social and family history were reviewed.   ROS:   Please see the history of present illness.    All other systems reviewed and are negative.   EKGs/Labs/Other Studies Reviewed:     The following studies were reviewed today:       EKG:  The ekg ordered today demonstrates sinus rhythm.  Ventricular rate 53 bpm.     Physical Exam:     VS:  BP 116/89   Pulse 56   Ht 6' 1 (1.854 m)   Wt 195 lb (88.5 kg)   SpO2 98%   BMI 25.73 kg/m         Wt Readings from Last 3 Encounters:  02/15/23 195 lb (88.5 kg)  02/08/23 192 lb 12.8 oz (87.5 kg)  01/24/23 192 lb 6.4 oz (87.3 kg)      GEN:  Well nourished, well developed in no acute distress CARDIAC: RRR, no murmurs, rubs, gallops RESPIRATORY:  Clear to auscultation without rales, wheezing or rhonchi      Assessment ASSESSMENT:     1. Persistent atrial fibrillation (HCC)   2. Atrial flutter, unspecified type (HCC)   3. Pre-op evaluation     PLAN:     In order of problems listed above:   #Persistent AF and AFL S/p ablation 10/2022. Had recurrence of AFL and required DCCV. On eliquis  for stroke ppx. He is doing well now  maintaining normal rhythm.  I have encouraged him to continue monitoring his heart rates and blood pressures at home.  If he notices new symptoms or changes in his heart rate/rhythm he should let us  know for us  to move up his follow-up appointment to discuss options.  If he were to have recurrence in the future, favor redo catheter ablation given his young age and overall health status.   Patient has experienced symptomatic recurrence of AF and presents for redo catheter ablation. Procedure reviewed.  Signed, OLE HOLTS, MD, [facility], [facility] 06/14/2024 Electrophysiology [facility]

## 2024-06-14 NOTE — Anesthesia Procedure Notes (Addendum)
 Procedure Name: Intubation Date/Time: 06/14/2024 7:39 AM  Performed by: Roslynn Waddell LABOR, CRNAPre-anesthesia Checklist: Patient identified, Emergency Drugs available, Suction available and Patient being monitored Patient Re-evaluated:Patient Re-evaluated prior to induction Oxygen Delivery Method: Circle System Utilized Preoxygenation: Pre-oxygenation with 100% oxygen Induction Type: IV induction Ventilation: Mask ventilation without difficulty Laryngoscope Size: Glidescope and 3 Grade View: Grade I Tube type: Oral Tube size: 7.5 mm Number of attempts: 1 Airway Equipment and Method: Stylet Placement Confirmation: ETT inserted through vocal cords under direct vision, positive ETCO2 and breath sounds checked- equal and bilateral Secured at: 23 cm Tube secured with: Tape Dental Injury: Teeth and Oropharynx as per pre-operative assessment and Injury to lip  Comments: Some injury to L lower lip. G/s blade used d/t difficult airway hx. CRNA was able to get grade 1 view, but had to maneuver GS blade for grade 1 view. In maneuvering GS blade, cut to L lower lip. Dentition undamaged.

## 2024-06-14 NOTE — Discharge Instructions (Signed)

## 2024-06-15 ENCOUNTER — Encounter (HOSPITAL_COMMUNITY): Payer: Self-pay | Admitting: Cardiology

## 2024-06-15 NOTE — Anesthesia Postprocedure Evaluation (Signed)
 Anesthesia Post Note  Patient: Sean [name]  Procedure(s) Performed: ATRIAL FIBRILLATION ABLATION     Patient location during evaluation: PACU Anesthesia Type: General Level of consciousness: awake and alert Pain management: pain level controlled Vital Signs Assessment: post-procedure vital signs reviewed and stable Respiratory status: spontaneous breathing, nonlabored ventilation and respiratory function stable Cardiovascular status: blood pressure returned to baseline Postop Assessment: no apparent nausea or vomiting Anesthetic complications: no   No notable events documented.  Last Vitals:  Vitals:   06/14/24 1205 06/14/24 1237  BP: 103/64 (!) 115/57  Pulse: (!) 50 60  Resp: 12 17  Temp:    SpO2: 95% 97%    Last Pain:  Vitals:   06/14/24 0925  TempSrc: Temporal  PainSc: 0-No pain                 Vertell Row

## 2024-06-17 ENCOUNTER — Other Ambulatory Visit: Payer: Self-pay

## 2024-06-17 DIAGNOSIS — I480 Paroxysmal atrial fibrillation: Secondary | ICD-10-CM

## 2024-06-17 MED ORDER — ZOLPIDEM TARTRATE 10 MG PO TABS: 10.0000 mg | ORAL_TABLET | Freq: Every evening | ORAL | 1 refills | Status: DC | PRN

## 2024-06-17 MED ORDER — APIXABAN 5 MG PO TABS: 5.0000 mg | ORAL_TABLET | Freq: Two times a day (BID) | ORAL | 2 refills | Status: AC

## 2024-06-17 MED FILL — Heparin Sodium (Porcine) Inj 1000 Unit/ML: INTRAMUSCULAR | Qty: 10 | Status: AC

## 2024-06-17 MED FILL — Fentanyl Citrate Preservative Free (PF) Inj 100 MCG/2ML: INTRAMUSCULAR | Qty: 1 | Status: AC

## 2024-06-17 NOTE — Telephone Encounter (Signed)
 Prescription refill request for Eliquis  received. Indication:AFIB Last office visit:6/25 Scr:1.15 7/25 Age: 68 Weight:88.9  kg  Prescription refilled

## 2024-06-18 ENCOUNTER — Ambulatory Visit: Admitting: Family Medicine

## 2024-06-20 ENCOUNTER — Encounter: Payer: Self-pay | Admitting: Emergency Medicine

## 2024-07-12 ENCOUNTER — Ambulatory Visit (HOSPITAL_COMMUNITY): Admitting: Physician Assistant

## 2024-07-16 ENCOUNTER — Ambulatory Visit (HOSPITAL_COMMUNITY)
Admission: RE | Admit: 2024-07-16 | Discharge: 2024-07-16 | Disposition: A | Discharge status: Home / Self Care | Source: Ambulatory Visit | Attending: Physician Assistant | Admitting: Physician Assistant

## 2024-07-16 VITALS — BP 128/62 | HR 55 | Ht 73.0 in | Wt 197.4 lb

## 2024-07-16 DIAGNOSIS — I4891 Unspecified atrial fibrillation: Secondary | ICD-10-CM | POA: Diagnosis not present

## 2024-07-16 DIAGNOSIS — D6869 Other thrombophilia: Secondary | ICD-10-CM | POA: Diagnosis not present

## 2024-07-16 DIAGNOSIS — I4819 Other persistent atrial fibrillation: Secondary | ICD-10-CM | POA: Diagnosis present

## 2024-07-16 NOTE — Progress Notes (Signed)
 Primary Care Physician: Micheal Wolm ORN, MD Referring Physician: Dr. Cindie Cardiologist: Dr. Delford Leech Sean [name] is a 68 y.o. male initially seen 08/28/19 for new onset PAF.  Successful DCCV on 10/01/19 with relapse, and repeat DCC on flecainide  12/16/2019 with early failure.   He was last seen by Dr. Kelsie on 09/17/2021 where he was in afib rate controlled and did not want to try a rhythm control strategy. He was exercising and feeling well.    He followed up with Dr. Delford on 02/14/2022. Continued in afib.  Was tolerating Toprol  25 mg daily for rate control strategy. He preferred to stay on Eliquis  despite CHADVASC 0. Since he is relatively young and very active they discussed referral to EP for a consult regarding ablation.  Echo 10/14/22- showed EF of 30-35% with moderately decreased function. He underwent cardioversion to SR.  He underwent afib ablation 11/17/22 with Dr. Cindie.  Today in the afib clinic, 12/15/21, he is in atrial flutter at 136 bpm. He is unaware. States he has ben exercising at [employer] and doing well. [facility] and does not pay too much attention to the pulse but has seen it elevated at times. He is taking toprol  50 mg daily. He is complaint with his eliquis  5 mg bid.   Return to afib clinic, 12/21/22. He has been in persistent AFib  since I  saw him last and will plan on cardioversion as previously discussed with Dr. Cindie. He has been taking the Cardizem  30 mg as needed for HR's over 100 bpm, and  this has blunted his v response. He is still tolerating afib well .  F/u in afib clinic, 01/24/23. He had a successful cardioversion and remains in SR today. He feels improved.   Follow up 07/16/24. Patient returns for follow up for atrial fibrillation. He is s/p afib and flutter ablation with Dr Cindie on 06/14/24. He remains in SR today and feels well. He denies chest pain or groin issues. No bleeding issues on anticoagulation.    Today, he  denies  symptoms of palpitations, chest pain, shortness of breath, orthopnea, PND, lower extremity edema, dizziness, presyncope, syncope, snoring, daytime somnolence, bleeding, or neurologic sequela. The patient is tolerating medications without difficulties and is otherwise without complaint today.    Past Medical History:  Diagnosis Date   Ascending aortic aneurysm [facility])    Atrial fibrillation (HCC)    Dyslipidemia 09/01/2008   Qualifier: Diagnosis of   By: Mavis MD, Norleen BRAVO        History of melanoma 02/08/2023   Malignant neoplasm of prostate (HCC) 06/27/2023    Current Outpatient Medications  Medication Sig Dispense Refill   apixaban  (ELIQUIS ) 5 MG TABS tablet Take 1 tablet (5 mg total) by mouth 2 (two) times daily. 180 tablet 2   diltiazem  (CARDIZEM  CD) 120 MG 24 hr capsule Take 1 capsule (120 mg total) by mouth daily. 90 capsule 3   losartan  (COZAAR ) 25 MG tablet Take 0.5 tablets (12.5 mg total) by mouth daily. 45 tablet 3   metoprolol  succinate (TOPROL -XL) 50 MG 24 hr tablet Take 1 tablet (50 mg total) by mouth daily. Take with or immediately following a meal. 90 tablet 3   pantoprazole  (PROTONIX ) 40 MG tablet Take 1 tablet (40 mg total) by mouth daily. Post procedure medication. Does not need refills. 45 tablet 0   zolpidem  (AMBIEN ) 10 MG tablet Take 1 tablet (10 mg total) by mouth at bedtime  as needed. for sleep 30 tablet 1   No current facility-administered medications for this encounter.    ROS- All systems are reviewed and negative except as per the HPI above  Physical Exam: Vitals:   07/16/24 1530  BP: 128/62  Pulse: (!) 55  Weight: 89.5 kg  Height: 6' 1 (1.854 m)    Wt Readings from Last 3 Encounters:  07/16/24 89.5 kg  06/14/24 88.9 kg  05/03/24 88.5 kg    GEN: Well nourished, well developed in no acute distress CARDIAC: Regular rate and rhythm, no murmurs, rubs, gallops RESPIRATORY:  Clear to auscultation without rales, wheezing or rhonchi  ABDOMEN: Soft,  non-tender, non-distended EXTREMITIES:  No edema; No deformity    EKG today demonstrates SB Vent. rate 55 BPM PR interval 154 ms QRS duration 108 ms QT/QTcB 462/441 ms   CHA2DS2-VASc Score = 2  The patient's score is based upon: CHF History: 0 HTN History: 0 Diabetes History: 0 Stroke History: 0 Vascular Disease History: 1 (aortic atherosclerosis) Age Score: 1 Gender Score: 0       ASSESSMENT AND PLAN: Persistent Atrial Fibrillation/atrial flutter (ICD10:  I48.19) The patient's CHA2DS2-VASc score is 2, indicating a 2.2% annual risk of stroke.   S/p afib ablation 11/17/22. Repeat afib and flutter ablation 06/14/24 Patient appears to be maintaining SR Continue Eliquis  5 mg BID with no missed doses for 3 months post ablation.  Continue Toprol  50 mg daily Continue diltiazem  120 mg daily  Secondary Hypercoagulable State (ICD10:  D68.69) The patient is at significant risk for stroke/thromboembolism based upon his CHA2DS2-VASc Score of 2.  Continue Apixaban  (Eliquis ). No bleeding issues.     Follow up with Charlies Arthur as scheduled.    Daril Kicks PA-C Afib Clinic [facility] [address] [place], [place] [address] [phone]

## 2024-09-11 ENCOUNTER — Ambulatory Visit: Admitting: Physician Assistant

## 2024-09-11 NOTE — Progress Notes (Unsigned)
  Electrophysiology Office Note:   Date:  09/12/2024  ID:  Sean [name], DOB 12/02/1955, MRN [id number]  Primary Cardiologist: Maude Emmer, MD Electrophysiologist: OLE ONEIDA HOLTS, MD   Electrophysiologist:  OLE ONEIDA HOLTS, MD      History of Present Illness:   Sean [name] is a 68 y.o. male with h/o  PAF and AFL s/p ablation seen today for routine electrophysiology followup.   Since last being seen in our clinic the patient reports doing very well. No breakthrough arrhythmia of which he is aware. Otherwise,  he denies chest pain, palpitations, dyspnea, PND, orthopnea, nausea, vomiting, dizziness, syncope, edema, weight gain, or early satiety.   Review of systems complete and found to be negative unless listed in HPI.   EP Information / Studies Reviewed:    EKG is ordered today. Personal review as below.  EKG Interpretation Date/Time:  Thursday September 12 2024 12:10:56 EDT Ventricular Rate:  51 PR Interval:  154 QRS Duration:  106 QT Interval:  480 QTC Calculation: 442 R Axis:   18  Text Interpretation: Sinus bradycardia with occasional Premature ventricular complexes Incomplete right bundle branch block When compared with ECG of 16-Jul-2024 15:36, Premature ventricular complexes are now Present Confirmed by Lesia Sharper [phone]) on 09/12/2024 12:17:29 PM    Arrhythmia/Device History S/p ablation 10/2022 and redo with PFA to PVI, CTI, and posterior wall 06/14/2024   Physical Exam:   VS:  BP 124/70 (BP Location: Left Arm, Patient Position: Sitting, Cuff Size: Normal)   Pulse (!) 51   Ht 6' 2 (1.88 m)   Wt 195 lb (88.5 kg)   SpO2 99%   BMI 25.04 kg/m     Wt Readings from Last 3 Encounters:  09/12/24 195 lb (88.5 kg)  07/16/24 197 lb 6.4 oz (89.5 kg)  06/14/24 196 lb (88.9 kg)     GEN: No acute distress NECK: No JVD; No carotid bruits CARDIAC: Regular rate and rhythm, no murmurs, rubs, gallops RESPIRATORY:  Clear to auscultation without rales, wheezing  or rhonchi  ABDOMEN: Soft, non-tender, non-distended EXTREMITIES:  No edema; No deformity   ASSESSMENT AND PLAN:    Persistent AF AFL S/p ablation 10/2022 and redo with PFA to PVI, CTI, and posterior wall 06/14/2024 EKG today shows sinus brady and a PVC Continue Eliquis  5 mg BID for CHA2DS2VASc of at least 2 (age and vascular disease). Discussion today about monitoring and watchful waiting vs continuing Eliquis . He has not had any issues, and will just plan to remain on.   Secondary hypercoagulable state Pt on Eliquis  as above    Follow up with EP Team in 6 months  Signed, Sharper Prentice Lesia, PA-C

## 2024-09-12 ENCOUNTER — Ambulatory Visit: Attending: Student | Admitting: Student

## 2024-09-12 ENCOUNTER — Encounter: Payer: Self-pay | Admitting: Student

## 2024-09-12 VITALS — BP 124/70 | HR 51 | Ht 74.0 in | Wt 195.0 lb

## 2024-09-12 DIAGNOSIS — I4819 Other persistent atrial fibrillation: Secondary | ICD-10-CM | POA: Insufficient documentation

## 2024-09-12 DIAGNOSIS — D6869 Other thrombophilia: Secondary | ICD-10-CM | POA: Insufficient documentation

## 2024-09-12 NOTE — Patient Instructions (Signed)
 Medication Instructions:  Your physician recommends that you continue on your current medications as directed. Please refer to the Current Medication list given to you today.  *If you need a refill on your cardiac medications before your next appointment, please call your pharmacy*  Lab Work: None ordered If you have labs (blood work) drawn today and your tests are completely normal, you will receive your results only by: MyChart Message (if you have MyChart) OR A paper copy in the mail If you have any lab test that is abnormal or we need to change your treatment, we will call you to review the results.  Follow-Up: At [facility], you and your health needs are our priority.  As part of our continuing mission to provide you with exceptional heart care, our providers are all part of one team.  This team includes your primary Cardiologist (physician) and Advanced Practice Providers or APPs (Physician Assistants and Nurse Practitioners) who all work together to provide you with the care you need, when you need it.  Your next appointment:   6 month(s)  Provider:   Bambi Lever "Michaelle Adolphus, PA-C

## 2024-11-25 ENCOUNTER — Other Ambulatory Visit (HOSPITAL_COMMUNITY): Payer: Self-pay

## 2024-12-27 ENCOUNTER — Telehealth: Payer: Self-pay | Admitting: Cardiovascular Disease

## 2024-12-27 NOTE — Telephone Encounter (Signed)
 Returned call to patient. Pt stated he was in a professional meeting and would have to call us  back.  Phone number given to patient as requested.

## 2024-12-27 NOTE — Telephone Encounter (Signed)
 Pt contacted office to report flutters in his field of vision. This is the only symptom the pt reported. Please advise.

## 2024-12-27 NOTE — Telephone Encounter (Signed)
 Pt is returning call to nurse.

## 2024-12-30 ENCOUNTER — Telehealth: Payer: Self-pay | Admitting: *Deleted

## 2024-12-30 NOTE — Telephone Encounter (Signed)
"   Called patient back about message. Patient stated that the fluttering has effected both eyes, and it is not continuous. Patient stated it happens on occasion. Informed patient to contact his PCP to discuss, but if he has the fluttering happening continuous he should get immediate medical attention. Will send message to Dr. Nishan for further advisement. "

## 2024-12-31 MED ORDER — ZOLPIDEM TARTRATE 10 MG PO TABS: 10.0000 mg | ORAL_TABLET | Freq: Every evening | ORAL | 1 refills | Status: AC | PRN
